# Patient Record
Sex: Female | Born: 1955
Health system: Southern US, Community
[De-identification: ages and names within clinical notes are randomized; demographics above are authoritative.]

## PROBLEM LIST (undated history)

## (undated) DIAGNOSIS — R519 Headache, unspecified: Secondary | ICD-10-CM

## (undated) DIAGNOSIS — E739 Lactose intolerance, unspecified: Secondary | ICD-10-CM

## (undated) DIAGNOSIS — K449 Diaphragmatic hernia without obstruction or gangrene: Secondary | ICD-10-CM

## (undated) DIAGNOSIS — R51 Headache: Secondary | ICD-10-CM

## (undated) DIAGNOSIS — M069 Rheumatoid arthritis, unspecified: Secondary | ICD-10-CM

## (undated) DIAGNOSIS — Z9289 Personal history of other medical treatment: Secondary | ICD-10-CM

## (undated) DIAGNOSIS — M1990 Unspecified osteoarthritis, unspecified site: Secondary | ICD-10-CM

## (undated) DIAGNOSIS — D649 Anemia, unspecified: Secondary | ICD-10-CM

## (undated) DIAGNOSIS — M7989 Other specified soft tissue disorders: Secondary | ICD-10-CM

## (undated) DIAGNOSIS — K5900 Constipation, unspecified: Secondary | ICD-10-CM

## (undated) DIAGNOSIS — J45909 Unspecified asthma, uncomplicated: Secondary | ICD-10-CM

## (undated) DIAGNOSIS — N179 Acute kidney failure, unspecified: Secondary | ICD-10-CM

## (undated) DIAGNOSIS — R0602 Shortness of breath: Secondary | ICD-10-CM

## (undated) DIAGNOSIS — I10 Essential (primary) hypertension: Secondary | ICD-10-CM

## (undated) DIAGNOSIS — I2699 Other pulmonary embolism without acute cor pulmonale: Secondary | ICD-10-CM

## (undated) DIAGNOSIS — R197 Diarrhea, unspecified: Secondary | ICD-10-CM

## (undated) DIAGNOSIS — D860 Sarcoidosis of lung: Secondary | ICD-10-CM

## (undated) HISTORY — DX: Rheumatoid arthritis, unspecified: M06.9

## (undated) HISTORY — DX: Constipation, unspecified: K59.00

## (undated) HISTORY — PX: ANKLE FRACTURE SURGERY: SHX122

## (undated) HISTORY — DX: Other specified soft tissue disorders: M79.89

## (undated) HISTORY — DX: Unspecified osteoarthritis, unspecified site: M19.90

## (undated) HISTORY — DX: Diaphragmatic hernia without obstruction or gangrene: K44.9

## (undated) HISTORY — DX: Lactose intolerance, unspecified: E73.9

## (undated) HISTORY — DX: Shortness of breath: R06.02

## (undated) HISTORY — PX: VAGINAL HYSTERECTOMY: SUR661

## (undated) HISTORY — PX: KNEE ARTHROSCOPY: SHX127

## (undated) HISTORY — PX: TUBAL LIGATION: SHX77

---

## 1977-07-26 DIAGNOSIS — Z9289 Personal history of other medical treatment: Secondary | ICD-10-CM

## 1977-07-26 HISTORY — DX: Personal history of other medical treatment: Z92.89

## 1998-01-29 ENCOUNTER — Emergency Department (HOSPITAL_COMMUNITY): Admission: EM | Admit: 1998-01-29 | Discharge: 1998-01-29 | Payer: Self-pay | Admitting: Emergency Medicine

## 1998-03-01 ENCOUNTER — Emergency Department (HOSPITAL_COMMUNITY): Admission: EM | Admit: 1998-03-01 | Discharge: 1998-03-01 | Payer: Self-pay | Admitting: Emergency Medicine

## 1998-04-04 ENCOUNTER — Ambulatory Visit (HOSPITAL_BASED_OUTPATIENT_CLINIC_OR_DEPARTMENT_OTHER): Admission: RE | Admit: 1998-04-04 | Discharge: 1998-04-04 | Payer: Self-pay | Admitting: Specialist

## 1998-04-14 ENCOUNTER — Encounter: Admission: RE | Admit: 1998-04-14 | Discharge: 1998-07-13 | Payer: Self-pay | Admitting: Specialist

## 1998-06-12 ENCOUNTER — Encounter: Admission: RE | Admit: 1998-06-12 | Discharge: 1998-06-12 | Payer: Self-pay | Admitting: Internal Medicine

## 1998-06-14 ENCOUNTER — Emergency Department (HOSPITAL_COMMUNITY): Admission: EM | Admit: 1998-06-14 | Discharge: 1998-06-14 | Payer: Self-pay | Admitting: Emergency Medicine

## 1998-06-24 ENCOUNTER — Encounter: Payer: Self-pay | Admitting: Obstetrics and Gynecology

## 1998-06-24 ENCOUNTER — Ambulatory Visit (HOSPITAL_COMMUNITY): Admission: RE | Admit: 1998-06-24 | Discharge: 1998-06-24 | Payer: Self-pay | Admitting: Obstetrics and Gynecology

## 1998-07-16 ENCOUNTER — Encounter: Admission: RE | Admit: 1998-07-16 | Discharge: 1998-07-16 | Payer: Self-pay | Admitting: Hematology and Oncology

## 1998-09-07 ENCOUNTER — Inpatient Hospital Stay (HOSPITAL_COMMUNITY): Admission: EM | Admit: 1998-09-07 | Discharge: 1998-09-10 | Payer: Self-pay | Admitting: Emergency Medicine

## 1998-09-07 ENCOUNTER — Encounter: Payer: Self-pay | Admitting: Emergency Medicine

## 1998-09-08 ENCOUNTER — Encounter: Payer: Self-pay | Admitting: Internal Medicine

## 1998-09-12 ENCOUNTER — Encounter: Admission: RE | Admit: 1998-09-12 | Discharge: 1998-09-12 | Payer: Self-pay | Admitting: Internal Medicine

## 1998-09-22 ENCOUNTER — Encounter: Admission: RE | Admit: 1998-09-22 | Discharge: 1998-09-22 | Payer: Self-pay | Admitting: Internal Medicine

## 1998-10-28 ENCOUNTER — Encounter: Admission: RE | Admit: 1998-10-28 | Discharge: 1998-10-28 | Payer: Self-pay | Admitting: Internal Medicine

## 1998-12-01 ENCOUNTER — Encounter: Admission: RE | Admit: 1998-12-01 | Discharge: 1998-12-01 | Payer: Self-pay | Admitting: Internal Medicine

## 1998-12-05 ENCOUNTER — Other Ambulatory Visit: Admission: RE | Admit: 1998-12-05 | Discharge: 1998-12-05 | Payer: Self-pay | Admitting: Obstetrics and Gynecology

## 1998-12-27 ENCOUNTER — Encounter: Payer: Self-pay | Admitting: Emergency Medicine

## 1998-12-27 ENCOUNTER — Emergency Department (HOSPITAL_COMMUNITY): Admission: EM | Admit: 1998-12-27 | Discharge: 1998-12-27 | Payer: Self-pay | Admitting: Emergency Medicine

## 1998-12-30 ENCOUNTER — Encounter: Admission: RE | Admit: 1998-12-30 | Discharge: 1998-12-30 | Payer: Self-pay | Admitting: Internal Medicine

## 1999-01-01 ENCOUNTER — Ambulatory Visit (HOSPITAL_COMMUNITY): Admission: RE | Admit: 1999-01-01 | Discharge: 1999-01-01 | Payer: Self-pay | Admitting: Internal Medicine

## 1999-01-01 ENCOUNTER — Encounter: Admission: RE | Admit: 1999-01-01 | Discharge: 1999-01-01 | Payer: Self-pay | Admitting: Hematology and Oncology

## 1999-01-01 ENCOUNTER — Encounter: Payer: Self-pay | Admitting: Internal Medicine

## 1999-01-28 ENCOUNTER — Encounter: Admission: RE | Admit: 1999-01-28 | Discharge: 1999-01-28 | Payer: Self-pay | Admitting: Internal Medicine

## 1999-02-13 ENCOUNTER — Encounter: Admission: RE | Admit: 1999-02-13 | Discharge: 1999-02-13 | Payer: Self-pay | Admitting: Internal Medicine

## 1999-03-11 ENCOUNTER — Ambulatory Visit (HOSPITAL_COMMUNITY): Admission: RE | Admit: 1999-03-11 | Discharge: 1999-03-11 | Payer: Self-pay | Admitting: Hematology and Oncology

## 1999-03-11 ENCOUNTER — Encounter: Payer: Self-pay | Admitting: Hematology and Oncology

## 1999-03-11 ENCOUNTER — Encounter: Admission: RE | Admit: 1999-03-11 | Discharge: 1999-03-11 | Payer: Self-pay | Admitting: Hematology and Oncology

## 1999-03-24 ENCOUNTER — Encounter: Admission: RE | Admit: 1999-03-24 | Discharge: 1999-03-24 | Payer: Self-pay | Admitting: Internal Medicine

## 1999-04-05 ENCOUNTER — Emergency Department (HOSPITAL_COMMUNITY): Admission: EM | Admit: 1999-04-05 | Discharge: 1999-04-05 | Payer: Self-pay | Admitting: Emergency Medicine

## 1999-06-17 ENCOUNTER — Encounter: Admission: RE | Admit: 1999-06-17 | Discharge: 1999-06-17 | Payer: Self-pay | Admitting: Internal Medicine

## 1999-06-26 ENCOUNTER — Encounter: Payer: Self-pay | Admitting: Obstetrics and Gynecology

## 1999-06-26 ENCOUNTER — Ambulatory Visit (HOSPITAL_COMMUNITY): Admission: RE | Admit: 1999-06-26 | Discharge: 1999-06-26 | Payer: Self-pay | Admitting: Obstetrics and Gynecology

## 1999-07-27 HISTORY — PX: WRIST FRACTURE SURGERY: SHX121

## 1999-08-20 ENCOUNTER — Encounter: Admission: RE | Admit: 1999-08-20 | Discharge: 1999-08-20 | Payer: Self-pay | Admitting: Internal Medicine

## 1999-09-29 ENCOUNTER — Encounter: Admission: RE | Admit: 1999-09-29 | Discharge: 1999-09-29 | Payer: Self-pay | Admitting: Internal Medicine

## 1999-09-30 ENCOUNTER — Ambulatory Visit (HOSPITAL_COMMUNITY): Admission: RE | Admit: 1999-09-30 | Discharge: 1999-09-30 | Payer: Self-pay | Admitting: Internal Medicine

## 1999-09-30 ENCOUNTER — Encounter: Payer: Self-pay | Admitting: Internal Medicine

## 1999-11-23 ENCOUNTER — Encounter: Payer: Self-pay | Admitting: Emergency Medicine

## 1999-11-23 ENCOUNTER — Emergency Department (HOSPITAL_COMMUNITY): Admission: EM | Admit: 1999-11-23 | Discharge: 1999-11-23 | Payer: Self-pay | Admitting: Emergency Medicine

## 1999-11-25 ENCOUNTER — Encounter: Admission: RE | Admit: 1999-11-25 | Discharge: 1999-11-25 | Payer: Self-pay | Admitting: Hematology and Oncology

## 1999-12-02 ENCOUNTER — Ambulatory Visit (HOSPITAL_COMMUNITY): Admission: RE | Admit: 1999-12-02 | Discharge: 1999-12-02 | Payer: Self-pay | Admitting: Hematology and Oncology

## 1999-12-02 ENCOUNTER — Encounter: Payer: Self-pay | Admitting: Internal Medicine

## 1999-12-02 ENCOUNTER — Encounter: Admission: RE | Admit: 1999-12-02 | Discharge: 1999-12-02 | Payer: Self-pay | Admitting: Internal Medicine

## 1999-12-10 ENCOUNTER — Other Ambulatory Visit: Admission: RE | Admit: 1999-12-10 | Discharge: 1999-12-10 | Payer: Self-pay | Admitting: Obstetrics and Gynecology

## 1999-12-16 ENCOUNTER — Encounter: Admission: RE | Admit: 1999-12-16 | Discharge: 1999-12-16 | Payer: Self-pay | Admitting: Internal Medicine

## 2000-01-06 ENCOUNTER — Encounter: Admission: RE | Admit: 2000-01-06 | Discharge: 2000-01-06 | Payer: Self-pay | Admitting: Internal Medicine

## 2000-02-02 ENCOUNTER — Encounter: Payer: Self-pay | Admitting: Emergency Medicine

## 2000-02-02 ENCOUNTER — Emergency Department (HOSPITAL_COMMUNITY): Admission: EM | Admit: 2000-02-02 | Discharge: 2000-02-02 | Payer: Self-pay | Admitting: Emergency Medicine

## 2000-03-28 ENCOUNTER — Emergency Department (HOSPITAL_COMMUNITY): Admission: EM | Admit: 2000-03-28 | Discharge: 2000-03-28 | Payer: Self-pay | Admitting: Emergency Medicine

## 2000-03-28 ENCOUNTER — Encounter: Payer: Self-pay | Admitting: Emergency Medicine

## 2000-04-01 ENCOUNTER — Ambulatory Visit (HOSPITAL_COMMUNITY): Admission: RE | Admit: 2000-04-01 | Discharge: 2000-04-01 | Payer: Self-pay | Admitting: Emergency Medicine

## 2000-04-01 ENCOUNTER — Encounter: Payer: Self-pay | Admitting: Emergency Medicine

## 2000-04-13 ENCOUNTER — Encounter: Payer: Self-pay | Admitting: Gastroenterology

## 2000-04-13 ENCOUNTER — Encounter: Admission: RE | Admit: 2000-04-13 | Discharge: 2000-04-13 | Payer: Self-pay | Admitting: Gastroenterology

## 2000-05-02 ENCOUNTER — Ambulatory Visit (HOSPITAL_COMMUNITY): Admission: RE | Admit: 2000-05-02 | Discharge: 2000-05-02 | Payer: Self-pay | Admitting: Gastroenterology

## 2000-05-04 ENCOUNTER — Encounter: Admission: RE | Admit: 2000-05-04 | Discharge: 2000-05-04 | Payer: Self-pay | Admitting: Internal Medicine

## 2000-05-19 ENCOUNTER — Inpatient Hospital Stay (HOSPITAL_COMMUNITY): Admission: EM | Admit: 2000-05-19 | Discharge: 2000-05-21 | Payer: Self-pay | Admitting: Emergency Medicine

## 2000-05-20 ENCOUNTER — Encounter: Payer: Self-pay | Admitting: Internal Medicine

## 2000-05-20 ENCOUNTER — Encounter: Payer: Self-pay | Admitting: Emergency Medicine

## 2000-06-22 ENCOUNTER — Encounter: Admission: RE | Admit: 2000-06-22 | Discharge: 2000-06-22 | Payer: Self-pay | Admitting: Internal Medicine

## 2000-07-22 ENCOUNTER — Inpatient Hospital Stay (HOSPITAL_COMMUNITY): Admission: EM | Admit: 2000-07-22 | Discharge: 2000-07-24 | Payer: Self-pay | Admitting: Emergency Medicine

## 2000-07-22 ENCOUNTER — Encounter: Payer: Self-pay | Admitting: Emergency Medicine

## 2000-07-23 ENCOUNTER — Encounter: Payer: Self-pay | Admitting: Internal Medicine

## 2000-07-24 ENCOUNTER — Encounter: Payer: Self-pay | Admitting: Internal Medicine

## 2000-07-29 ENCOUNTER — Encounter: Admission: RE | Admit: 2000-07-29 | Discharge: 2000-07-29 | Payer: Self-pay

## 2000-08-11 ENCOUNTER — Encounter: Admission: RE | Admit: 2000-08-11 | Discharge: 2000-08-11 | Payer: Self-pay | Admitting: Internal Medicine

## 2000-08-16 ENCOUNTER — Encounter: Admission: RE | Admit: 2000-08-16 | Discharge: 2000-08-16 | Payer: Self-pay | Admitting: Internal Medicine

## 2000-08-16 ENCOUNTER — Encounter: Payer: Self-pay | Admitting: Internal Medicine

## 2000-08-16 ENCOUNTER — Encounter: Admission: RE | Admit: 2000-08-16 | Discharge: 2000-11-14 | Payer: Self-pay | Admitting: Internal Medicine

## 2000-09-20 ENCOUNTER — Emergency Department (HOSPITAL_COMMUNITY): Admission: EM | Admit: 2000-09-20 | Discharge: 2000-09-20 | Payer: Self-pay

## 2000-11-02 ENCOUNTER — Emergency Department (HOSPITAL_COMMUNITY): Admission: EM | Admit: 2000-11-02 | Discharge: 2000-11-02 | Payer: Self-pay | Admitting: Emergency Medicine

## 2000-11-02 ENCOUNTER — Encounter: Payer: Self-pay | Admitting: Emergency Medicine

## 2001-01-06 ENCOUNTER — Encounter: Payer: Self-pay | Admitting: Emergency Medicine

## 2001-01-07 ENCOUNTER — Observation Stay (HOSPITAL_COMMUNITY): Admission: EM | Admit: 2001-01-07 | Discharge: 2001-01-07 | Payer: Self-pay | Admitting: Emergency Medicine

## 2001-01-25 ENCOUNTER — Encounter: Admission: RE | Admit: 2001-01-25 | Discharge: 2001-01-25 | Payer: Self-pay | Admitting: Internal Medicine

## 2001-03-20 ENCOUNTER — Other Ambulatory Visit: Admission: RE | Admit: 2001-03-20 | Discharge: 2001-03-20 | Payer: Self-pay | Admitting: Obstetrics and Gynecology

## 2001-03-20 ENCOUNTER — Emergency Department (HOSPITAL_COMMUNITY): Admission: EM | Admit: 2001-03-20 | Discharge: 2001-03-20 | Payer: Self-pay | Admitting: Emergency Medicine

## 2001-09-20 ENCOUNTER — Encounter: Admission: RE | Admit: 2001-09-20 | Discharge: 2001-09-20 | Payer: Self-pay | Admitting: Internal Medicine

## 2001-10-02 ENCOUNTER — Emergency Department (HOSPITAL_COMMUNITY): Admission: EM | Admit: 2001-10-02 | Discharge: 2001-10-02 | Payer: Self-pay | Admitting: Emergency Medicine

## 2001-10-18 ENCOUNTER — Encounter: Admission: RE | Admit: 2001-10-18 | Discharge: 2001-10-18 | Payer: Self-pay | Admitting: Internal Medicine

## 2001-12-08 ENCOUNTER — Encounter: Admission: RE | Admit: 2001-12-08 | Discharge: 2001-12-08 | Payer: Self-pay | Admitting: Internal Medicine

## 2002-01-02 ENCOUNTER — Encounter: Payer: Self-pay | Admitting: Obstetrics and Gynecology

## 2002-01-02 ENCOUNTER — Ambulatory Visit (HOSPITAL_COMMUNITY): Admission: RE | Admit: 2002-01-02 | Discharge: 2002-01-02 | Payer: Self-pay | Admitting: Obstetrics and Gynecology

## 2002-03-28 ENCOUNTER — Inpatient Hospital Stay (HOSPITAL_COMMUNITY): Admission: AD | Admit: 2002-03-28 | Discharge: 2002-03-28 | Payer: Self-pay | Admitting: Obstetrics and Gynecology

## 2002-04-04 ENCOUNTER — Encounter: Admission: RE | Admit: 2002-04-04 | Discharge: 2002-04-04 | Payer: Self-pay | Admitting: Internal Medicine

## 2002-05-09 ENCOUNTER — Other Ambulatory Visit: Admission: RE | Admit: 2002-05-09 | Discharge: 2002-05-09 | Payer: Self-pay | Admitting: Obstetrics and Gynecology

## 2002-05-10 ENCOUNTER — Encounter: Admission: RE | Admit: 2002-05-10 | Discharge: 2002-05-10 | Payer: Self-pay | Admitting: Internal Medicine

## 2002-05-14 ENCOUNTER — Encounter: Payer: Self-pay | Admitting: Cardiology

## 2002-05-14 ENCOUNTER — Ambulatory Visit (HOSPITAL_COMMUNITY): Admission: RE | Admit: 2002-05-14 | Discharge: 2002-05-14 | Payer: Self-pay | Admitting: Internal Medicine

## 2002-06-01 ENCOUNTER — Encounter: Admission: RE | Admit: 2002-06-01 | Discharge: 2002-06-01 | Payer: Self-pay | Admitting: Internal Medicine

## 2002-08-09 ENCOUNTER — Emergency Department (HOSPITAL_COMMUNITY): Admission: EM | Admit: 2002-08-09 | Discharge: 2002-08-10 | Payer: Self-pay | Admitting: Emergency Medicine

## 2002-08-10 ENCOUNTER — Encounter: Payer: Self-pay | Admitting: Emergency Medicine

## 2002-08-15 ENCOUNTER — Encounter: Admission: RE | Admit: 2002-08-15 | Discharge: 2002-08-15 | Payer: Self-pay | Admitting: Internal Medicine

## 2002-08-15 ENCOUNTER — Ambulatory Visit (HOSPITAL_COMMUNITY): Admission: RE | Admit: 2002-08-15 | Discharge: 2002-08-15 | Payer: Self-pay | Admitting: Internal Medicine

## 2002-08-15 ENCOUNTER — Encounter: Payer: Self-pay | Admitting: Internal Medicine

## 2002-08-31 ENCOUNTER — Encounter: Admission: RE | Admit: 2002-08-31 | Discharge: 2002-08-31 | Payer: Self-pay | Admitting: Internal Medicine

## 2002-09-19 ENCOUNTER — Encounter: Admission: RE | Admit: 2002-09-19 | Discharge: 2002-09-19 | Payer: Self-pay | Admitting: Internal Medicine

## 2002-10-03 ENCOUNTER — Encounter: Payer: Self-pay | Admitting: *Deleted

## 2002-10-03 ENCOUNTER — Emergency Department (HOSPITAL_COMMUNITY): Admission: EM | Admit: 2002-10-03 | Discharge: 2002-10-04 | Payer: Self-pay | Admitting: Emergency Medicine

## 2002-11-21 ENCOUNTER — Encounter: Admission: RE | Admit: 2002-11-21 | Discharge: 2002-11-21 | Payer: Self-pay | Admitting: Internal Medicine

## 2003-01-23 ENCOUNTER — Encounter: Admission: RE | Admit: 2003-01-23 | Discharge: 2003-01-23 | Payer: Self-pay | Admitting: Internal Medicine

## 2003-04-19 ENCOUNTER — Ambulatory Visit (HOSPITAL_COMMUNITY): Admission: RE | Admit: 2003-04-19 | Discharge: 2003-04-19 | Payer: Self-pay | Admitting: Obstetrics and Gynecology

## 2003-04-19 ENCOUNTER — Encounter: Payer: Self-pay | Admitting: Obstetrics and Gynecology

## 2004-01-18 ENCOUNTER — Emergency Department (HOSPITAL_COMMUNITY): Admission: EM | Admit: 2004-01-18 | Discharge: 2004-01-19 | Payer: Self-pay | Admitting: Emergency Medicine

## 2004-09-30 ENCOUNTER — Emergency Department (HOSPITAL_COMMUNITY): Admission: EM | Admit: 2004-09-30 | Discharge: 2004-09-30 | Payer: Self-pay | Admitting: *Deleted

## 2008-08-16 ENCOUNTER — Emergency Department (HOSPITAL_COMMUNITY): Admission: EM | Admit: 2008-08-16 | Discharge: 2008-08-16 | Payer: Self-pay | Admitting: Emergency Medicine

## 2009-08-30 ENCOUNTER — Emergency Department (HOSPITAL_COMMUNITY): Admission: EM | Admit: 2009-08-30 | Discharge: 2009-08-30 | Payer: Self-pay | Admitting: Emergency Medicine

## 2009-09-03 ENCOUNTER — Emergency Department (HOSPITAL_COMMUNITY): Admission: EM | Admit: 2009-09-03 | Discharge: 2009-09-03 | Payer: Self-pay | Admitting: Emergency Medicine

## 2010-07-22 ENCOUNTER — Ambulatory Visit (HOSPITAL_COMMUNITY)
Admission: RE | Admit: 2010-07-22 | Discharge: 2010-07-22 | Payer: Self-pay | Source: Home / Self Care | Attending: Internal Medicine | Admitting: Internal Medicine

## 2010-09-21 ENCOUNTER — Emergency Department (HOSPITAL_COMMUNITY)
Admission: EM | Admit: 2010-09-21 | Discharge: 2010-09-22 | Disposition: A | Discharge status: Home / Self Care | Payer: Self-pay | Attending: Emergency Medicine | Admitting: Emergency Medicine

## 2010-09-21 DIAGNOSIS — E669 Obesity, unspecified: Secondary | ICD-10-CM | POA: Insufficient documentation

## 2010-09-21 DIAGNOSIS — D869 Sarcoidosis, unspecified: Secondary | ICD-10-CM | POA: Insufficient documentation

## 2010-09-21 DIAGNOSIS — R110 Nausea: Secondary | ICD-10-CM | POA: Insufficient documentation

## 2010-09-21 DIAGNOSIS — M25519 Pain in unspecified shoulder: Secondary | ICD-10-CM | POA: Insufficient documentation

## 2010-09-22 ENCOUNTER — Emergency Department (HOSPITAL_COMMUNITY): Payer: Self-pay

## 2010-10-16 LAB — POCT I-STAT, CHEM 8
BUN: 8 mg/dL (ref 6–23)
Chloride: 102 mEq/L (ref 96–112)
Sodium: 138 mEq/L (ref 135–145)

## 2010-10-16 LAB — URINALYSIS, ROUTINE W REFLEX MICROSCOPIC
Glucose, UA: NEGATIVE mg/dL
Leukocytes, UA: NEGATIVE
Nitrite: NEGATIVE
Protein, ur: 30 mg/dL — AB
pH: 7 (ref 5.0–8.0)

## 2010-10-16 LAB — DIFFERENTIAL
Basophils Absolute: 0 10*3/uL (ref 0.0–0.1)
Lymphocytes Relative: 11 % — ABNORMAL LOW (ref 12–46)
Neutro Abs: 6.3 10*3/uL (ref 1.7–7.7)
Neutrophils Relative %: 86 % — ABNORMAL HIGH (ref 43–77)

## 2010-10-16 LAB — CBC
Platelets: 306 10*3/uL (ref 150–400)
RDW: 12 % (ref 11.5–15.5)

## 2010-10-16 LAB — URINE MICROSCOPIC-ADD ON

## 2010-11-09 LAB — DIFFERENTIAL
Eosinophils Absolute: 0 10*3/uL (ref 0.0–0.7)
Lymphocytes Relative: 16 % (ref 12–46)
Lymphs Abs: 1.3 10*3/uL (ref 0.7–4.0)
Monocytes Relative: 5 % (ref 3–12)
Neutro Abs: 6.5 10*3/uL (ref 1.7–7.7)
Neutrophils Relative %: 78 % — ABNORMAL HIGH (ref 43–77)

## 2010-11-09 LAB — CBC
Hemoglobin: 11.8 g/dL — ABNORMAL LOW (ref 12.0–15.0)
MCHC: 33.8 g/dL (ref 30.0–36.0)
MCV: 98.6 fL (ref 78.0–100.0)
RDW: 12.1 % (ref 11.5–15.5)

## 2010-11-09 LAB — LIPASE, BLOOD: Lipase: 19 U/L (ref 11–59)

## 2010-11-09 LAB — COMPREHENSIVE METABOLIC PANEL
ALT: 27 U/L (ref 0–35)
Calcium: 9.3 mg/dL (ref 8.4–10.5)
Creatinine, Ser: 0.75 mg/dL (ref 0.4–1.2)
GFR calc non Af Amer: >60 mL/min (ref >60)
Glucose, Bld: 124 mg/dL — ABNORMAL HIGH (ref 70–99)
Sodium: 140 mEq/L (ref 135–145)
Total Protein: 7.4 g/dL (ref 6.0–8.3)

## 2010-12-11 NOTE — Consult Note (Signed)
[place]. [facility]  Patient:    Michelle Rios, Michelle Rios                       MRN: [id number] Proc. Date: 07/22/00 Adm. Date:  [id number] Attending:  Farley Ly CC:         Farley Ly, M.D.   Consultation Report  REQUESTING PHYSICIAN:  Farley Ly, M.D.  CHIEF COMPLAINT:  Cough and chest pain.  HISTORY:  This is a very complicated 55 year old black female with a history of sarcoid documented by transbronchial biopsy in May 1990 with interstitial lung disease then.  I have seen her in the past for episodes of dyspnea and cough, but she is admitted at this point because of chest pain.  It turns out for the last 12 years or so, she has been having intermittent right flank and anterior chest discomfort that comes and goes with no pattern whatsoever.  It can last from several minutes to several hours to several weeks at a time.  It does appear to be made slightly worse by taking a deep breath, but is not intensely pleuritic and does not cause any dyspnea.  I had seen her for a cough in my office and it can vary between being just vaguely painful to being intensely painful.  I saw her with mild pain in my office on November 13 and did not even note that she was complaining of any chest pain.  However, I gave her Depo Medrol injection for a cough that was present and wanted to see if the cough was responsive to steroids, at which time, I was going to ask her to start a course of Pulmicort empirically (reasoning that she either cough bearing asthma or sarcoid involvement of the airways and we might approach this without the use of systemic steroids by using Pulmicort.  However, she was not enthusiastic about inhaled steroids and never called me back to report whether the Depo Medrol helped the cough.  She states, it did help the chest pain.  On the hand, the chest pain has come and gone without any intervention in the past so it is very difficult to  know whether the Depo Medrol in fact helped the pain at all.  She is now readmitted with intense pain with lasting for several hours until she received morphine in the emergency room.  She was also given nitroglycerin with questionable improvement.  She had borderline positive CPK-MBs and was admitted for further evaluation.  Presently, she denies any significant cough.  She states, the cough also comes and goes without any pattern and does not remember whether or not the Depo Medrol injection eliminated the cough.  She states, the cough has been more intense over the last week, but denies the cough brought on the pain or if there has ever been an association between the cough and the chest pain.  She denies any fevers, chills, sweats, orthopnea, PND or leg swelling.  PAST MEDICAL HISTORY: 1. Morbid obesity. 2. Sarcoid diagnosed May 1990 by transbronchial biopsy. 3. Status post hysterectomy 1996.  ALLERGIES:  IVP DYE causes her throat to "close up".  MEDICATIONS:  She is not consistent about taking any medication on a regular basis.  SOCIAL HISTORY:  She has never been a smoker.  She is minimally exposed to passive smoke.  She works as a Water engineer at [employer].  She has no unusual travel, pet or hobby exposure.  FAMILY  HISTORY:  Positive for emphysema in a father, a smoker, who also had asthma and heart disease.  REVIEW OF SYSTEMS:  Taken in detail and essentially negative, except for difficulty with weight control with no overt reflux symptoms.  PHYSICAL EXAMINATION:  GENERAL:  This is a morbidly obese black female lying back in bed with head of bed elevated less than 30 degrees with no increase work of breathing.  She appears to have a very abnormal affect with somewhat of a "belle indifference" affect.  She is afebrile.  VITAL SIGNS:  Normal.  HEENT:  Oropharynx is clear with no evidence of postnasal drainage or cobblestoning.  No evidence of  thrush.  NECK:  Supple without cervical adenopathy, ______ thyromegaly.  LUNGS:  Lung fields perfectly clear bilaterally to auscultation and percussion with no rub appreciated.  CARDIAC:  Regular rate and rhythm without murmur, gallop or rub present. There was no displacement of PMI.  Although S1 and S2 were diminished.  ABDOMEN:  Obese, but otherwise benign.  There is no palpable organomegaly or masses or tenderness.  EXTREMITIES:  Warm without calf tenderness, cyanosis, clubbing or edema.  NEUROLOGIC:  No focal deficits ______ , but mood and affect were a bit unusual as noted.  SKIN:  Warm and dry with no rash or lesions.  Chest x-ray revealed scarring in the right upper lobe and slight fullness to the right hilum.  I reviewed her jacket and found no acute changes on x-ray and I also noted that she had had a previous abdominal ultrasound which was normal (presumably for the same pain) and a VQ scan (presumably for the same pain), which revealed a 60 second defect in the right upper lobe.  Hemoglobin saturation was 94 to 98% on room air.  IMPRESSION:  Recurrent chest pain causing admission.  The pain has had the exact same distribution and only varies in terms of intensity over the last 12 years, never in terms of location.  This makes it extremely unlikely that pulmonary embolism is the explanation of pain because course of pulmonary embolism would not tend to go to the same place over and over again. Nevertheless, I agree with obtaining of spiral CT scan to be complete and also to shed further light on any interstitial or pleural changes that may indicate active sarcoid.  (Sarcoid does not usually cause pleuritic pain, but it can be associated with pleural effusion in less than 5% of cases.) If there is no pleural effusion present, I think that the pleuritic pain is not likely related at all to sarcoid.  I think the chest pain is most likely irritable bowel syndrome or  reflux related.  I am more likely to believe it is irritable bowel related than  reflux just because it is lateralizing and not central.  I recommend that she be started on Citrucel one teaspoon daily and be maintained on PPI (Although she states, she takes PPI therapy, she is not consistent at all about taking it.).  Finally, I note that this patients cough has been problematic.  The problem with the chest pain and the cough is that there are not consistent enough problems to lend themselves to therapeutic trials of any medications. That is, they come and go spontaneously and trying to eliminate them would be like trying to hit a shifting goal post in my opinion.  This is especially true given the fact that her affect is a bit bizarre and that she has been difficult at best to  work with as an outpatient.  I would certainly be happy to participate in her care as an outpatient however, if she feels I can be of any help.  In conclusion, I do not believe that the chest pain is related to sarcoid.  I do not believe the chest pain is of pulmonary etiology unless she has some form musculoskeletal discomfort related to coughing, although, she denied any association between the intensity of the cough that comes and goes and the onset of the chest pain, which also comes and goes over the last 12 years. DD:  07/23/00 TD:  07/23/00 Job: [id number]

## 2010-12-11 NOTE — Procedures (Signed)
[facility]  Patient:    Michelle Rios, Michelle Rios                       MRN: [id number] Proc. Date: 05/02/00 Adm. Date:  [id number] Attending:  Rich Brave CC:         Farley Ly, M.D.   Procedure Report  PROCEDURE:  Upper endoscopy.  INDICATIONS FOR PROCEDURE:  This is a 55 year old female with nonspecific upper abdominal symptoms which have been somewhat refractory, although gradually improving, on antipeptic therapy. Endoscopy about 4 years ago was unrevealing.  FINDINGS:  Normal exam other than small hiatal hernia and esophageal ring.  DESCRIPTION OF PROCEDURE:  The nature, purpose and risk of the procedure were familiar to the patient from prior examination and she provided written consent. Sedation was fentanyl 50 mcg and Versed 4 mg IV without arrhythmias or desaturations. The Olympus adult video endoscope was passed under direct vision. The vocal cords were only very briefly seen; it appeared that the larynx was somewhat boggy although not overtly inflamed. The esophagus had normal mucosa throughout its entire length, without evidence of reflux esophagitis, Barretts esophagus, varices, infection or neoplasia and no active reflux was noted during this exam. The stomach was entered, traversing widely patent esophageal ring at the squamocolumnar junction, below which was a 2 cm hiatal hernia.  The stomach contained no significant residual and had normal mucosa without evidence of gastritis, erosions, ulcers, polyps or masses and a retroflexed view of the proximal stomach was unremarkable. The pylorus, duodenal bulb and second duodenum looked normal.  The previous gastric and duodenal biopsies obtained 4 years were negative so I did not obtain biopsies today. The scope was removed from the patient who tolerated the procedure well and without apparent complication.  IMPRESSION:  Small hiatal hernia with esophageal ring, otherwise  normal exam.  PLAN:  Clinical follow-up. Continue current therapy (includes Nexium). DD:  05/02/00 TD:  05/02/00 Job: [id number]

## 2010-12-11 NOTE — Discharge Summary (Signed)
[place]. [facility]  Patient:    Michelle Rios, Michelle Rios                       MRN: [id number] Adm. Date:  [id number] Disc. Date: [id number] Attending:  Madaline Guthrie Dictator:   Rennis Petty, M.D. CC:         Farley Ly, M.D.  Charlaine Dalton. Sherene Sires, M.D. [facility]   Discharge Summary  DISCHARGE DIAGNOSES: 1. Lower extremity swelling secondary to Norvasc. 2. Pulmonary sarcoidosis diagnosed by bronchial biopsy in 1989. Recent    CT scan revealed persistent right lower lobe infiltrate with perihilar    adenopathy, no recent PFTs done, 96% on room air oxygenation. 3. Obesity. 4. Hypertension. 5. Anemia. 6. Asthma.  DISCHARGE MEDICATIONS: 1. Do not take Norvasc. 2. Lasix 20 mg 1 tab p.o. q.d.  PROCEDURES:  On May 19, 2000, a 2-D echo which was technically limited but showed normal right atrium as well as normal right ventricular sides, PA where not measurable, normal ejection fraction of 60% and trace AR but otherwise technically limited study. Venous Dopplers on May 20, 2000 showed no evidence of lower extremity DVT. CT scan of chest on May 20, 2000 which revealed right lower lobe infiltrate, needs to be compared to previous CT but no acute PE currently.  BRIEF ADMITTING HPI:  A 55 year old female with history of lower extremity pain and swelling that has been progressive over the last few weeks. She denies cough, chest pain or shortness of breath. Admission vitals were stable with O2 sat of 96% on room air, stable blood pressure 124/74. Examination revealed tender bilateral calves with bilateral symmetrical pitting edema up to the calves area.  ADMISSION LABS:  Sodium 137, potassium 3.3, chloride 104, bicarb 40, BUN 12, creatinine 0.9. BMET repeated revealed actual bicarb of 26, ______ 40 and a normal potassium. Liver function tests were within normal limits. D-dimer was negative at 0.15.  EKG was within normal limits. Chest x-ray revealed right  lower lobe infiltrate. No other acute changes.  HOSPITAL COURSE BY PROBLEM: #1 - LOWER EXTREMITY SWELLING.  Michelle Rios actually came late in the night and was unable to have Dopplers and therefore she was kept in the hospital for evaluation. Doppler tests bilaterally revealed no events of DVT. CT scan of the chest also revealed no evidence of acute PE. We also did a 2-D echo to rule out right heart failure, cor pulmonale from her sarcoidosis or obstructive sleep apnea causing pulmonary hypertension. She did have a technically difficult echo as mentioned above but overall normal RV size and RA size which pretty much ruled out severe pulmonary hypertension causing her pedal edema. Michelle Rios says that she used to take inhalers but currently does not take them. An arterial blood gas done this admission revealed a pCO2 of 53 with a pH of 7.31. She does have a history of CO2 retention as well. She has not had PFTs in a while therefore the plan was for her to have outpatient pulmonary function test to evaluate this further. #2 - SHORTNESS OF BREATH. I feel her shortness of breath is not due to a pulmonary ______ as demonstrated by CT of her chest. We felt this was progression of her disease and she will require outpatient PFTs and eventually may be in a sleep study. She refuses inhalers though we had recommended them vehemently for her ? obstructive lung disease. As demonstrated by her FEV1.  FOLLOW-UP:  The following things  need to be followed up as an outpatient: 1. For her pulmonary status, the patient will require outpatient pulmonary    function test. 2. Persistent right lower lobe infiltrate. She will need that worked up as    well though somehow it seems that diagnosis is made as sarcoid by bronchial    biopsy in 1989. 3. She will also require inhaler therapy at least ______ a little bit more on    that point. 4. Free bilateral filling, I will give her ______ currently. 5. Anemia  studies are still pending. This will need to be followed up with    a repeat CBC as an outpatient. I will alert Dr. Meredith Pel on this patients    admission and our plan with her and eventual follow-up with Dr. Meredith Pel    as well as Dr. Sherene Sires. DD:  05/21/00 TD:  05/22/00 Job: [id number]

## 2010-12-11 NOTE — Discharge Summary (Signed)
[place]. [facility]  Patient:    Michelle Rios, Michelle Rios                       MRN: [id number] Adm. Date:  [id number] Disc. Date: [id number] Attending:  Farley Ly Dictator:   Duncan Dull, M.D.                           Discharge Summary  DATE OF BIRTH:  03-12-1956  DISCHARGE DIAGNOSES: 1. Atypical chest pain secondary to sarcoid. 2. Hypertension. 3. History of sarcoid. 4. Gastroesophageal reflux disease.  DISCHARGE MEDICATIONS: 1. Tessalon Perles 100 mg 1 p.o. t.i.d. for cough. 2. Amitriptyline 25 mg 1 p.o. q.h.s. p.r.n. pain. 3. Lasix 20 mg 1 p.o. q.a.m. 4. Flovent 44 mcg metered dose inhaler 2 puffs inhaled twice daily for    wheezing. 5. Prevacid 15 mg 1 p.o. q.d. p.r.n. reflux and cough. 6. Prednisone 5 mg 4 p.o. q.d. x 1 week, then 3 p.o. q.d. x 1 week, then    2 p.o. q.d. x 1 week, then 1 p.o. q.d. x 1 week, then stop.  CONSULTATIONS: 1. Dr. Sandrea Hughs, pulmonology. 2. Dr. Andee Lineman, cardiology.  PROCEDURES: 1. Spiral CT July 23, 2000.  Impression:    a. No evidence of acute pulmonary embolism by CT.    b. Chronic changes involving the upper right lobe including oligemia and       underlying pulmonary air space disease. 2. Adenosine Cardiolite obtained on July 24, 2000, with the following    impression:  Mild hypokinesis involving the distal septum and extending    into the left ventricular apex.  Part of this appearance may be on the    basis of breast attenuation, although ischemia cannot be excluded without a    rest study.  FOLLOW-UP: 1. The patient is to call Dr. Andee Lineman, her cardiologist, for a follow-up    appointment.  She was given the telephone number of [phone]. 2. The patient will follow up with Dr. Meredith Pel on August 05, 2000, for a    regularly scheduled clinic visit.  HISTORY OF PRESENT ILLNESS:  The patient is a 55 year old black female with a history of sarcoid and reflux who presents with sharp pleuritic  right-sided chest pain which began on the evening prior to admission while at rest.  The patient denied shortness of breath, had mild nausea but no emesis, and no diaphoresis.  The pain persisted throughout the night and was 10/10 in severity and not alleviated with position.  She presented to the ED in the morning and had a significant improvement in pain after receiving morphine in the ED.  She reports recent nonproductive cough without fever.  She had a similar pain back in November when she went to see Dr. Sherene Sires, and the pain disappeared at that time after receiving IV Medrol.  PHYSICAL EXAMINATION:  VITAL SIGNS:  On admission temperature 97.4, pulse 64, blood pressure 135/87, respiratory rate 18, saturation 95% on room air.  CHEST:  Notable for soft crackles bilaterally with no wheezes.  CARDIOVASCULAR:  Regular rate and rhythm without murmurs, rubs, or gallops. There was no pedal edema.  RECTAL:  Guaiac-negative.  LABORATORY DATA:  Admission chest x-ray showed no acute disease and no significant change from previous chest x-rays.  EKG was normal sinus rhythm.  First set of CK enzymes showed a CK of 531, CK-MB of 9.7, troponin I less than  0.01.  ABG on room air showed a pH of 7.39, pCO2 of 50, pO2 of 70, and a bicarbonate of 30.  HOSPITAL COURSE: #1 - ATYPICAL CHEST PAIN:  The patient was admitted to a telemetry bed to rule out acute myocardial infarction with serial cardiac enzymes and EKGs. Cardiology consult was obtained, and an adenosine stress Cardiolite was scheduled since the patient had dyspnea with exertion secondary to her pulmonary disease.  Cardiac isoenzymes were normal, EKG was normal, and adenosine Cardiolite had the above-referenced results.  The patient was discharged to home with instructions to follow up with her cardiologist as an outpatient.  Additionally, pulmonary embolus was ruled out with a spiral CT. The patient was noted to have significant  improvement with pain after starting her on prednisone.  She was given a prednisone taper and also started on amitriptyline on the presumption that her pain was neuropathic/inflammatory in etiology.  #2 - DYSPNEA:  The patient was noted to be complaining of dyspnea, although she had saturations of 94% on room air.  She was not wheezing on exam.  She was evaluated by Dr. Sherene Sires.  Her dyspnea was considered to be secondary to her body habitus, as the patient is morbidly obese.  She was encouraged to begin walking 15 minutes a day three times a week and increase as tolerated.  She was also instructed to continue using her Flovent metered dose inhaler twice daily for wheezing.  #3 - COUGH:  The patient had a nonproductive cough during her hospitalization. She remained afebrile, with a normal white count.  She was given a prescription for Tessalon Perles at the time of discharge.  #4 - REFLUX:  The patients symptoms were well controlled on Prevacid 1 p.o. q.d. for cough and reflux.  In summary, the patients atypical chest pain was considered to be pleuritic in nature and secondary to her sarcoid.  She was ruled out for cardiac ischemia and pulmonary embolus with the above-mentioned tests.  She was started on amitriptyline and a prednisone taper while in-house and had significant resolution of her symptoms.  She will follow up with Dr. Meredith Pel in the internal medicine clinic on August 05, 2000.  PERTINENT DISCHARGE LABORATORY DATA:  White blood count of 10.3, hemoglobin of 10.3, hematocrit 29.9, MCV of 95.6, platelets of 348.  Sodium 139, potassium 3.9, chloride 103, bicarbonate 29, glucose 111, BUN 12, creatinine 0.8, calcium 8.9.  CKs were 531, 436, and 339.  CK-MBs were 9.7, 6.3, 4.5. Relative indices were 1.8, 1.4, and 1.3.  Troponin Is were less than 0.01 x 3. DD:  08/03/00 TD:  08/03/00 Job: [id number]

## 2010-12-17 ENCOUNTER — Inpatient Hospital Stay (INDEPENDENT_AMBULATORY_CARE_PROVIDER_SITE_OTHER)
Admission: RE | Admit: 2010-12-17 | Discharge: 2010-12-17 | Disposition: A | Discharge status: Home / Self Care | Payer: Self-pay | Source: Ambulatory Visit | Attending: Emergency Medicine | Admitting: Emergency Medicine

## 2010-12-17 DIAGNOSIS — M25519 Pain in unspecified shoulder: Secondary | ICD-10-CM

## 2010-12-17 DIAGNOSIS — I10 Essential (primary) hypertension: Secondary | ICD-10-CM

## 2011-08-01 ENCOUNTER — Emergency Department (HOSPITAL_COMMUNITY)
Admission: EM | Admit: 2011-08-01 | Discharge: 2011-08-01 | Disposition: A | Discharge status: Home / Self Care | Payer: PRIVATE HEALTH INSURANCE | Attending: Emergency Medicine | Admitting: Emergency Medicine

## 2011-08-01 ENCOUNTER — Other Ambulatory Visit: Payer: Self-pay

## 2011-08-01 ENCOUNTER — Encounter: Payer: Self-pay | Admitting: *Deleted

## 2011-08-01 DIAGNOSIS — I10 Essential (primary) hypertension: Secondary | ICD-10-CM | POA: Insufficient documentation

## 2011-08-01 DIAGNOSIS — H538 Other visual disturbances: Secondary | ICD-10-CM | POA: Insufficient documentation

## 2011-08-01 DIAGNOSIS — G43909 Migraine, unspecified, not intractable, without status migrainosus: Secondary | ICD-10-CM

## 2011-08-01 HISTORY — DX: Essential (primary) hypertension: I10

## 2011-08-01 LAB — POCT I-STAT, CHEM 8
BUN: 9 mg/dL (ref 6–23)
Chloride: 102 mEq/L (ref 96–112)
Creatinine, Ser: 0.7 mg/dL (ref 0.50–1.10)
Potassium: 3.7 mEq/L (ref 3.5–5.1)
Sodium: 142 mEq/L (ref 135–145)

## 2011-08-01 MED ORDER — SODIUM CHLORIDE 0.9 % IV BOLUS (SEPSIS)
500.0000 mL | Freq: Once | INTRAVENOUS | Status: AC
  Administered 2011-08-01: 500 mL via INTRAVENOUS

## 2011-08-01 MED ORDER — DROPERIDOL 2.5 MG/ML IJ SOLN
2.5000 mg | Freq: Once | INTRAMUSCULAR | Status: AC
  Administered 2011-08-01: 2.5 mg via INTRAVENOUS
  Filled 2011-08-01: qty 1

## 2011-08-01 NOTE — ED Notes (Signed)
No new rx given, pt voiced understanding to f/u with PCP and return for worsening of condition

## 2011-08-01 NOTE — ED Notes (Signed)
Pt reports improvement of blurred vision, pt states, "My blurred vision is much better."

## 2011-08-01 NOTE — ED Provider Notes (Signed)
History     CSN: [id number]  Arrival date & time 08/01/11  1557   First MD Initiated Contact with Patient 08/01/11 1709      Chief Complaint  Patient presents with  . Blurred Vision    (Consider location/radiation/quality/duration/timing/severity/associated sxs/prior treatment) HPI The pt has had blurred vision Since 1500. She now has a headache. Her bp has been high. She has not been on bp meds for 2 years. History of migraine headaches.  Past Medical History  Diagnosis Date  . Hypertension     Past Surgical History  Procedure Date  . Wrist surgery   . Tubal ligation   . Vaginal hysterectomy   . Knee cartilage surgery     History reviewed. No pertinent family history.  History  Substance Use Topics  . Smoking status: Never Smoker   . Smokeless tobacco: Not on file  . Alcohol Use: No    OB History    Grav Para Term Preterm Abortions TAB SAB Ect Mult Living                  Review of Systems  All other systems reviewed and are negative.    Allergies  Ivp dye  Home Medications  No current outpatient prescriptions on file.  BP 172/104  Pulse 70  Temp(Src) 98.3 F (36.8 C) (Oral)  Resp 20  SpO2 98%  Physical Exam  Nursing note and vitals reviewed. Constitutional: She is oriented to person, place, and time. She appears well-developed and well-nourished. No distress.  HENT:  Head: Normocephalic and atraumatic.  Eyes: Pupils are equal, round, and reactive to light.  Neck: Normal range of motion.  Cardiovascular: Normal rate and intact distal pulses.        Patient has a normal corrected QT interval of 437  Pulmonary/Chest: No respiratory distress.  Abdominal: Normal appearance. She exhibits no distension.  Musculoskeletal: Normal range of motion.  Neurological: She is alert and oriented to person, place, and time. No cranial nerve deficit.  Skin: Skin is warm and dry. No rash noted.  Psychiatric: She has a normal mood and affect. Her behavior is  normal.    ED Course  Procedures (including critical care time)  Labs Reviewed  POCT I-STAT, CHEM 8 - Abnormal; Notable for the following:    Hemoglobin 11.9 (*)    HCT 35.0 (*)    All other components within normal limits  I-STAT, CHEM 8   No results found.   1. Migraine headache       MDM  After treatment in the ED the patient feels back to baseline and wants to go home.         Nelia Shi, MD 08/01/11 Barry Brunner

## 2011-08-01 NOTE — ED Notes (Signed)
The pt has had blurred vision  Since 1500.  She now has a headache.  Her bp has been high.  She has not been on bp meds for 2 years.  History of migraine headaches.

## 2011-08-01 NOTE — ED Notes (Signed)
Unable to establish IV access. IV team paged. 

## 2011-08-09 ENCOUNTER — Other Ambulatory Visit (HOSPITAL_COMMUNITY): Payer: Self-pay | Admitting: Nurse Practitioner

## 2011-08-09 DIAGNOSIS — Z1231 Encounter for screening mammogram for malignant neoplasm of breast: Secondary | ICD-10-CM

## 2011-09-09 ENCOUNTER — Ambulatory Visit (HOSPITAL_COMMUNITY): Payer: PRIVATE HEALTH INSURANCE

## 2011-09-20 ENCOUNTER — Ambulatory Visit (HOSPITAL_COMMUNITY)
Admission: RE | Admit: 2011-09-20 | Discharge: 2011-09-20 | Disposition: A | Discharge status: Home / Self Care | Payer: PRIVATE HEALTH INSURANCE | Source: Ambulatory Visit | Attending: Nurse Practitioner | Admitting: Nurse Practitioner

## 2011-09-20 DIAGNOSIS — Z1231 Encounter for screening mammogram for malignant neoplasm of breast: Secondary | ICD-10-CM | POA: Insufficient documentation

## 2012-10-13 ENCOUNTER — Ambulatory Visit (HOSPITAL_COMMUNITY)
Admission: RE | Admit: 2012-10-13 | Discharge: 2012-10-13 | Disposition: A | Discharge status: Home / Self Care | Payer: PRIVATE HEALTH INSURANCE | Source: Ambulatory Visit | Attending: Internal Medicine | Admitting: Internal Medicine

## 2012-10-13 ENCOUNTER — Other Ambulatory Visit (HOSPITAL_COMMUNITY): Payer: Self-pay | Admitting: Internal Medicine

## 2012-10-13 DIAGNOSIS — R0602 Shortness of breath: Secondary | ICD-10-CM

## 2012-10-13 DIAGNOSIS — J45909 Unspecified asthma, uncomplicated: Secondary | ICD-10-CM | POA: Insufficient documentation

## 2012-10-19 ENCOUNTER — Other Ambulatory Visit (HOSPITAL_COMMUNITY): Payer: Self-pay | Admitting: Internal Medicine

## 2012-10-19 DIAGNOSIS — Z1231 Encounter for screening mammogram for malignant neoplasm of breast: Secondary | ICD-10-CM

## 2012-10-24 ENCOUNTER — Ambulatory Visit (HOSPITAL_COMMUNITY)
Admission: RE | Admit: 2012-10-24 | Discharge: 2012-10-24 | Disposition: A | Discharge status: Home / Self Care | Payer: PRIVATE HEALTH INSURANCE | Source: Ambulatory Visit | Attending: Internal Medicine | Admitting: Internal Medicine

## 2012-10-24 DIAGNOSIS — Z1231 Encounter for screening mammogram for malignant neoplasm of breast: Secondary | ICD-10-CM | POA: Insufficient documentation

## 2015-09-15 ENCOUNTER — Observation Stay (HOSPITAL_COMMUNITY): Payer: PRIVATE HEALTH INSURANCE

## 2015-09-15 ENCOUNTER — Encounter (HOSPITAL_COMMUNITY): Payer: Self-pay | Admitting: Emergency Medicine

## 2015-09-15 ENCOUNTER — Observation Stay (HOSPITAL_COMMUNITY)
Admission: EM | Admit: 2015-09-15 | Discharge: 2015-09-17 | Disposition: A | Discharge status: Home / Self Care | Payer: Self-pay | Attending: Family Medicine | Admitting: Family Medicine

## 2015-09-15 DIAGNOSIS — A084 Viral intestinal infection, unspecified: Principal | ICD-10-CM | POA: Insufficient documentation

## 2015-09-15 DIAGNOSIS — I10 Essential (primary) hypertension: Secondary | ICD-10-CM | POA: Insufficient documentation

## 2015-09-15 DIAGNOSIS — R10811 Right upper quadrant abdominal tenderness: Secondary | ICD-10-CM | POA: Insufficient documentation

## 2015-09-15 DIAGNOSIS — R0602 Shortness of breath: Secondary | ICD-10-CM | POA: Insufficient documentation

## 2015-09-15 DIAGNOSIS — N179 Acute kidney failure, unspecified: Secondary | ICD-10-CM | POA: Insufficient documentation

## 2015-09-15 DIAGNOSIS — D869 Sarcoidosis, unspecified: Secondary | ICD-10-CM | POA: Insufficient documentation

## 2015-09-15 DIAGNOSIS — R0600 Dyspnea, unspecified: Secondary | ICD-10-CM | POA: Insufficient documentation

## 2015-09-15 DIAGNOSIS — R197 Diarrhea, unspecified: Secondary | ICD-10-CM

## 2015-09-15 DIAGNOSIS — E860 Dehydration: Secondary | ICD-10-CM | POA: Insufficient documentation

## 2015-09-15 HISTORY — DX: Acute kidney failure, unspecified: N17.9

## 2015-09-15 HISTORY — DX: Diarrhea, unspecified: R19.7

## 2015-09-15 LAB — URINALYSIS, ROUTINE W REFLEX MICROSCOPIC
Glucose, UA: NEGATIVE mg/dL
Ketones, ur: NEGATIVE mg/dL
Nitrite: NEGATIVE
Protein, ur: 30 mg/dL — AB
Specific Gravity, Urine: 1.021 (ref 1.005–1.030)
pH: 5 (ref 5.0–8.0)

## 2015-09-15 LAB — CBC
HCT: 34.8 % — ABNORMAL LOW (ref 36.0–46.0)
Hemoglobin: 11.2 g/dL — ABNORMAL LOW (ref 12.0–15.0)
MCH: 31.2 pg (ref 26.0–34.0)
MCHC: 32.2 g/dL (ref 30.0–36.0)
MCV: 96.9 fL (ref 78.0–100.0)
Platelets: 247 10*3/uL (ref 150–400)
RBC: 3.59 MIL/uL — ABNORMAL LOW (ref 3.87–5.11)
RDW: 12.1 % (ref 11.5–15.5)
WBC: 4.4 10*3/uL (ref 4.0–10.5)

## 2015-09-15 LAB — URINE MICROSCOPIC-ADD ON

## 2015-09-15 LAB — COMPREHENSIVE METABOLIC PANEL
ALBUMIN: 3.7 g/dL (ref 3.5–5.0)
ALK PHOS: 98 U/L (ref 38–126)
ALT: 62 U/L — ABNORMAL HIGH (ref 14–54)
ANION GAP: 13 (ref 5–15)
AST: 101 U/L — AB (ref 15–41)
BILIRUBIN TOTAL: 1.1 mg/dL (ref 0.3–1.2)
BUN: 15 mg/dL (ref 6–20)
CALCIUM: 9.2 mg/dL (ref 8.9–10.3)
CO2: 23 mmol/L (ref 22–32)
Chloride: 99 mmol/L — ABNORMAL LOW (ref 101–111)
Creatinine, Ser: 1.21 mg/dL — ABNORMAL HIGH (ref 0.44–1.00)
GFR calc Af Amer: 56 mL/min — ABNORMAL LOW (ref >60)
GFR, EST NON AFRICAN AMERICAN: 48 mL/min — AB (ref >60)
GLUCOSE: 117 mg/dL — AB (ref 65–99)
POTASSIUM: 3.5 mmol/L (ref 3.5–5.1)
Sodium: 135 mmol/L (ref 135–145)
TOTAL PROTEIN: 7.8 g/dL (ref 6.5–8.1)

## 2015-09-15 LAB — GASTROINTESTINAL PANEL BY PCR, STOOL (REPLACES STOOL CULTURE)

## 2015-09-15 LAB — LIPASE, BLOOD: Lipase: 23 U/L (ref 11–51)

## 2015-09-15 MED ORDER — ONDANSETRON HCL 4 MG/2ML IJ SOLN: 4.0000 mg | Freq: Four times a day (QID) | INTRAMUSCULAR | Status: DC | PRN

## 2015-09-15 MED ORDER — ACETAMINOPHEN 325 MG PO TABS: 650.0000 mg | ORAL_TABLET | Freq: Four times a day (QID) | ORAL | Status: DC | PRN

## 2015-09-15 MED ORDER — ENOXAPARIN SODIUM 40 MG/0.4ML ~~LOC~~ SOLN
40.0000 mg | SUBCUTANEOUS | Status: DC
  Administered 2015-09-15 – 2015-09-16 (×2): 40 mg via SUBCUTANEOUS
  Filled 2015-09-15 (×2): qty 0.4

## 2015-09-15 MED ORDER — SODIUM CHLORIDE 0.9 % IV BOLUS (SEPSIS)
1000.0000 mL | Freq: Once | INTRAVENOUS | Status: AC
  Administered 2015-09-15: 1000 mL via INTRAVENOUS

## 2015-09-15 MED ORDER — ACETAMINOPHEN 325 MG PO TABS
650.0000 mg | ORAL_TABLET | Freq: Once | ORAL | Status: AC
  Administered 2015-09-15: 650 mg via ORAL
  Filled 2015-09-15: qty 2

## 2015-09-15 MED ORDER — ACETAMINOPHEN 650 MG RE SUPP
650.0000 mg | Freq: Four times a day (QID) | RECTAL | Status: DC | PRN
Start: 2015-09-15 — End: 2015-09-17

## 2015-09-15 MED ORDER — ONDANSETRON HCL 4 MG PO TABS: 4.0000 mg | ORAL_TABLET | Freq: Four times a day (QID) | ORAL | Status: DC | PRN

## 2015-09-15 MED ORDER — SODIUM CHLORIDE 0.9 % IV SOLN
INTRAVENOUS | Status: DC
  Administered 2015-09-15 – 2015-09-17 (×4): via INTRAVENOUS

## 2015-09-15 NOTE — Progress Notes (Signed)
NURSING PROGRESS NOTE  Michelle Rios [id number] Admission Data: 09/15/2015 4:55 PM Attending Provider: Latrelle Dodrill, MD XQJ:JHERDE,YCXK, MD Code Status: FULL  Michelle Rios is a 60 y.o. female patient admitted from ED:  -No acute distress noted.  -No complaints of shortness of breath.  -No complaints of chest pain.   Cardiac Monitoring: N/A  Blood pressure 101/75, pulse 76, temperature 100.2 F (37.9 C), temperature source Oral, resp. rate 14, height 5\' 1"  (1.549 m), weight 117.935 kg (260 lb), SpO2 96 %.   IV Fluids:  IV in place, occlusive dsg intact without redness, IV cath antecubital right, condition patent and no redness normal saline.   Allergies:  Ivp dye  Past Medical History:   has a past medical history of Hypertension and Sarcoidosis (HCC).  Past Surgical History:   has past surgical history that includes Wrist surgery; Tubal ligation; Vaginal hysterectomy; and Knee cartilage surgery.  Social History:   reports that she has never smoked. She does not have any smokeless tobacco history on file. She reports that she does not drink alcohol or use illicit drugs.  Skin: Intact  Patient/Family orientated to room. Information packet given to patient/family. Admission inpatient armband information verified with patient/family to include name and date of birth and placed on patient arm. Side rails up x 2, fall assessment and education completed with patient/family. Patient/family able to verbalize understanding of risk associated with falls and verbalized understanding to call for assistance before getting out of bed. Call light within reach. Patient/family able to voice and demonstrate understanding of unit orientation instructions.    Will continue to evaluate and treat per MD orders.  , RN

## 2015-09-15 NOTE — ED Provider Notes (Signed)
CSN: [id number]     Arrival date & time 09/15/15  0259 History   First MD Initiated Contact with Patient 09/15/15 [phone]     Chief Complaint  Patient presents with  . Diarrhea     (Consider location/radiation/quality/duration/timing/severity/associated sxs/prior Treatment) HPI Comments: Patient here complaining of watery diarrhea 2 days. 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 Denies any fever or chills. No vomiting. Diffuse abdominal cramping. Denies any recent antibiotic use. Symptoms are persistent and associated weakness with standing. Denies any syncope or near-syncope. No treatment used prior to arrival  Patient is a 60 y.o. female presenting with diarrhea. The history is provided by the patient.  Diarrhea   Past Medical History  Diagnosis Date  . Hypertension    Past Surgical History  Procedure Laterality Date  . Wrist surgery    . Tubal ligation    . Vaginal hysterectomy    . Knee cartilage surgery     No family history on file. Social History  Substance Use Topics  . Smoking status: Never Smoker   . Smokeless tobacco: None  . Alcohol Use: No   OB History    No data available     Review of Systems  Gastrointestinal: Positive for diarrhea.  All other systems reviewed and are negative.     Allergies  Ivp dye  Home Medications   Prior to Admission medications   Not on File   BP 118/64 mmHg  Pulse 84  Temp(Src) 98.8 F (37.1 C) (Oral)  Resp 18  Ht 5\' 1"  (1.549 m)  Wt 117.935 kg  BMI 49.15 kg/m2  SpO2 95% Physical Exam  Constitutional: She is oriented to person, place, and time. She appears well-developed and well-nourished.  Non-toxic appearance. No distress.  HENT:  Head: Normocephalic and atraumatic.  Eyes: Conjunctivae, EOM and lids are normal. Pupils are equal, round, and reactive to light.  Neck: Normal range of motion. Neck supple. No tracheal deviation present. No thyroid mass present.  Cardiovascular: Normal rate,  regular rhythm and normal heart sounds.  Exam reveals no gallop.   No murmur heard. Pulmonary/Chest: Effort normal and breath sounds normal. No stridor. No respiratory distress. She has no decreased breath sounds. She has no wheezes. She has no rhonchi. She has no rales.  Abdominal: Soft. Normal appearance and bowel sounds are normal. She exhibits no distension. There is no tenderness. There is no rebound and no CVA tenderness.  Musculoskeletal: Normal range of motion. She exhibits no edema or tenderness.  Neurological: She is alert and oriented to person, place, and time. She has normal strength. No cranial nerve deficit or sensory deficit. GCS eye subscore is 4. GCS verbal subscore is 5. GCS motor subscore is 6.  Skin: Skin is warm and dry. No abrasion and no rash noted.  Psychiatric: She has a normal mood and affect. Her speech is normal and behavior is normal.  Nursing note and vitals reviewed.   ED Course  Procedures (including critical care time) Labs Review Labs Reviewed  COMPREHENSIVE METABOLIC PANEL - Abnormal; Notable for the following:    Chloride 99 (*)    Glucose, Bld 117 (*)    Creatinine, Ser 1.21 (*)    AST 101 (*)    ALT 62 (*)    GFR calc non Af Amer 48 (*)    GFR calc Af Amer 56 (*)    All other components within normal limits  CBC - Abnormal; Notable for the following:  RBC 3.59 (*)    Hemoglobin 11.2 (*)    HCT 34.8 (*)    All other components within normal limits  GASTROINTESTINAL PANEL BY PCR, STOOL (REPLACES STOOL CULTURE)  LIPASE, BLOOD  URINALYSIS, ROUTINE W REFLEX MICROSCOPIC (NOT AT [facility])    Imaging Review No results found. I have personally reviewed and evaluated these images and lab results as part of my medical decision-making.   EKG Interpretation None      MDM   Final diagnoses:  None    Patient given IV fluids here and now has spiked a temperature to 100.2 she endorses increased weakness. Typically treated with Tylenol. Does have  evidence of dehydration with a creatinine being 1.21. Will admit for observation    Lorre Nick, MD 09/15/15 1252

## 2015-09-15 NOTE — H&P (Signed)
Family Medicine Teaching [facility] Admission History and Physical Service Pager: [phone]  Patient name: Michelle Rios Medical record number: [id number] Date of birth: [birth year] Age: 60 y.o. Gender: female  Primary Care Provider: Quitman Livings, MD Consultants: Non  Code Status: Full  Chief Complaint: Diarrhea and epigastric abdominal pain   Assessment and Plan: Michelle Rios is a 60 y.o. female presenting with diarrhea and epigastric abdominal pain  PMH significant for sacordosis and HTN   Diarrhea and epigastric abdominal pain: Patient with 2 day history of epigastric abdominal pain and multiple watery stools. History of exposure to C. Difficile. Differential to include gastroenteritis viral versus bacterial. GI panel obtained in the ED negative. - Admit to family medicine teaching service Dr. Pollie Meyer - Gastrointestinal Panel, many GI bugs on this, all negative  - Will get C.difficule PCR - If worsening abdominal pain, would consider further imaging - Chest x-ray negative for any acute process - Will get AM CBC and BMET  - Vitals per floor - IV fluids @ NS 150 ml, received 2 bolus in the ED  - Zofran for nausea  - Waiting for C.diff pcr before considering antibiotic treatment   AKI: SCr 1.2 (baseline 0.7): likely secondary to intravascular depletion from copious volume loss over the past few days. - IVF as above - Will get AM BMET    FEN/GI: Clear liquid diet-- advance diet as tolerated  Prophylaxis: Lovenox   Disposition: Admit for observation.   History of Present Illness:  Michelle Rios is a 60 y.o. female presenting with diarrhea and epigastric pain.  Patient reports having watery stools since two days ago. She reports at least 20 bowel movements yesterday. She has not been able to eat anything since yesterday secondary to decreased appetite. She reports a fever in the 100s over the weekend. She has nausea without emesis. 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  She has not been on recent antibiotics  Review Of Systems: Per HPI with the following additions: She reports abdominal cramps and dizziness. No headaches, chills, chest pain, palpitations, dyspnea, dyspnea, frequency.  Otherwise the remainder of the systems were negative.  Patient Active Problem List   Diagnosis Date Noted  . Diarrhea 09/15/2015  . AKI (acute kidney injury) (HCC) 09/15/2015    Past Medical History: Past Medical History  Diagnosis Date  . Hypertension   . Sarcoidosis [facility])     Past Surgical History: Past Surgical History  Procedure Laterality Date  . Wrist surgery    . Tubal ligation    . Vaginal hysterectomy    . Knee cartilage surgery      Social History: Social History  Substance Use Topics  . Smoking status: Never Smoker   . Smokeless tobacco: None  . Alcohol Use: No   Please also refer to relevant sections of EMR.  Family History: Family History  Problem Relation Age of Onset  . Heart attack Father   . Heart disease Mother   . Diabetes Mother   . Stroke Paternal Grandmother   . Breast cancer Sister     Allergies and Medications: Allergies  Allergen Reactions  . Ivp Dye [Iodinated Diagnostic Agents] Anaphylaxis and Swelling   No current facility-administered medications on file prior to encounter.   No current outpatient prescriptions on file prior to encounter.    Objective: BP 125/58 mmHg  Pulse 75  Temp(Src) 98.5 F (36.9 C) (Oral)  Resp  18  Ht 5\' 1"  (1.549 m)  Wt 260 lb (117.935 kg)  BMI 49.15 kg/m2  SpO2 98% Exam: General: Lying in bed, NAD  Eyes: PERRLA  ENTM: Moist mucous membranes  Neck: No ROM  Cardiovascular: RRR, no murmurs, rubs, or gallops  Respiratory: Anterior auscultation. Mild wheezes in upper lung fields Abdomen: BS+, epigastric tenderness to palpation, no rebound, guarding MSK: No lower extremity edema, right ankle tenderness,  negative homans or calf tenderness  Skin: No rashes or ulcers  Neuro: Equal strength and sensation bilaterally  Psych: Alert and orientated x3    Labs and Imaging: CBC BMET   Recent Labs Lab 09/15/15 0325  WBC 4.4  HGB 11.2*  HCT 34.8*  PLT 247    Recent Labs Lab 09/15/15 0325  NA 135  K 3.5  CL 99*  CO2 23  BUN 15  CREATININE 1.21*  GLUCOSE 117*  CALCIUM 9.2     GI panel negative  Dg Chest 2 View  09/15/2015  CLINICAL DATA:  60 year old female with nausea, abdominal pain, and diarrhea. Patient presenting with shortness of breath. EXAM: CHEST  2 VIEW COMPARISON:  Radiograph dated 10/13/2012 FINDINGS: Two views of the chest do not demonstrate a focal consolidation. There is no pleural effusion or pneumothorax. Stable cardiac size. The aorta is tortuous. No acute osseous pathology identified. IMPRESSION: No active cardiopulmonary disease. Electronically Signed   By: 10/15/2012 M.D.   On: 09/15/2015 15:09      Asiyah 09/17/2015, MD 09/15/2015, 5:50 PM PGY-1, [facility] FPTS Intern pager: [phone], text pages welcome  I have seen and examined the patient. I have read and agree with the above note. My changes are noted in blue.  [phone], MD PGY-3, [facility] 09/15/2015, 7:53 PM

## 2015-09-15 NOTE — ED Notes (Signed)
Pt. reports diarrhea with nausea and generalized abdominal cramping onset Saturday , denies emesis or fever .

## 2015-09-15 NOTE — ED Notes (Signed)
RN attempt IV 2x. Will have another RN to attempt.

## 2015-09-16 ENCOUNTER — Observation Stay (HOSPITAL_COMMUNITY): Payer: PRIVATE HEALTH INSURANCE

## 2015-09-16 ENCOUNTER — Encounter (HOSPITAL_COMMUNITY): Payer: Self-pay | Admitting: General Practice

## 2015-09-16 DIAGNOSIS — R0600 Dyspnea, unspecified: Secondary | ICD-10-CM | POA: Insufficient documentation

## 2015-09-16 DIAGNOSIS — R10811 Right upper quadrant abdominal tenderness: Secondary | ICD-10-CM | POA: Insufficient documentation

## 2015-09-16 DIAGNOSIS — R197 Diarrhea, unspecified: Secondary | ICD-10-CM

## 2015-09-16 DIAGNOSIS — R0602 Shortness of breath: Secondary | ICD-10-CM | POA: Insufficient documentation

## 2015-09-16 DIAGNOSIS — N179 Acute kidney failure, unspecified: Secondary | ICD-10-CM

## 2015-09-16 LAB — CBC
HCT: 31.1 % — ABNORMAL LOW (ref 36.0–46.0)
Hemoglobin: 10.2 g/dL — ABNORMAL LOW (ref 12.0–15.0)
MCH: 31.9 pg (ref 26.0–34.0)
MCHC: 32.8 g/dL (ref 30.0–36.0)
MCV: 97.2 fL (ref 78.0–100.0)
Platelets: 226 K/uL (ref 150–400)
RBC: 3.2 MIL/uL — ABNORMAL LOW (ref 3.87–5.11)
RDW: 12.2 % (ref 11.5–15.5)
WBC: 3.5 K/uL — ABNORMAL LOW (ref 4.0–10.5)

## 2015-09-16 LAB — BASIC METABOLIC PANEL WITH GFR
Anion gap: 6 (ref 5–15)
BUN: 17 mg/dL (ref 6–20)
CO2: 23 mmol/L (ref 22–32)
Calcium: 8.2 mg/dL — ABNORMAL LOW (ref 8.9–10.3)
Chloride: 108 mmol/L (ref 101–111)
Creatinine, Ser: 0.92 mg/dL (ref 0.44–1.00)
GFR calc Af Amer: >60 mL/min
GFR calc non Af Amer: >60 mL/min
Glucose, Bld: 100 mg/dL — ABNORMAL HIGH (ref 65–99)
Potassium: 3.7 mmol/L (ref 3.5–5.1)
Sodium: 137 mmol/L (ref 135–145)

## 2015-09-16 LAB — C DIFFICILE QUICK SCREEN W PCR REFLEX
C DIFFICILE (CDIFF) TOXIN: NEGATIVE
C DIFFICLE (CDIFF) ANTIGEN: NEGATIVE
C Diff interpretation: NEGATIVE

## 2015-09-16 LAB — HEPATIC FUNCTION PANEL
ALBUMIN: 3.1 g/dL — AB (ref 3.5–5.0)
ALK PHOS: 71 U/L (ref 38–126)
ALT: 37 U/L (ref 14–54)
AST: 49 U/L — AB (ref 15–41)
BILIRUBIN TOTAL: 0.5 mg/dL (ref 0.3–1.2)
Bilirubin, Direct: 0.1 mg/dL (ref 0.1–0.5)
Indirect Bilirubin: 0.4 mg/dL (ref 0.3–0.9)
TOTAL PROTEIN: 6.5 g/dL (ref 6.5–8.1)

## 2015-09-16 LAB — SEDIMENTATION RATE: Sed Rate: 41 mm/h — ABNORMAL HIGH (ref 0–22)

## 2015-09-16 MED ORDER — ALBUTEROL SULFATE (2.5 MG/3ML) 0.083% IN NEBU
2.5000 mg | INHALATION_SOLUTION | RESPIRATORY_TRACT | Status: DC | PRN
  Administered 2015-09-17: 2.5 mg via RESPIRATORY_TRACT
  Filled 2015-09-16: qty 3

## 2015-09-16 MED ORDER — IPRATROPIUM-ALBUTEROL 0.5-2.5 (3) MG/3ML IN SOLN
3.0000 mL | Freq: Once | RESPIRATORY_TRACT | Status: AC
  Administered 2015-09-17: 3 mL via RESPIRATORY_TRACT
  Filled 2015-09-16: qty 3

## 2015-09-16 NOTE — Progress Notes (Signed)
Family Medicine Teaching Service Daily Progress Note Intern Pager: [phone]  Patient name: Michelle Rios Medical record number: [id number] Date of birth: 20-Jul-1956 Age: 60 y.o. Gender: female  Primary Care Provider: Antonietta Jewel, MD Consultants: None Code Status: Full   Pt Overview and Major Events to Date:    Assessment and Plan: ANTONIA JICHA is a 60 y.o. female presenting with diarrhea and epigastric abdominal pain PMH significant for sacordosis and HTN   Diarrhea and epigastric abdominal pain: Patient still with epigastric cramping. No tenderness on palpation, only a couple of stools overnight. 2 day history of epigastric abdominal pain and multiple watery stools. History of exposure to C. Difficile. Differential to include gastroenteritis viral versus bacterial. GI panel obtained in the ED negative. Likely viral gastroenteritis vs.possibly biliary colic due to presences of AST 101 and ALT 62  - Gastrointestinal Panel, many GI bugs on this, all negative and  C.difficule PCR  Negative  - Will get RUQ U/S and Liver panel  - ESR to consider Sacordosis exacerbation  - Zofran for nausea   Resolved AKI: SCr 1.2 (baseline 0.7)> 0.92: likely secondary to intravascular depletion from copious volume loss over the past few days. - IVF NS @ 150 ml   Wheezing: Sudden onset on Saturday.  - Duonebs q4h prn and albuterol prn   FEN/GI: Clear liquid diet-- advance diet as tolerated  Prophylaxis: Lovenox   Disposition: Discharge home tomorrow    Subjective:  States slightly nauseated. However was able to eat some chicken and mash potatoes last night. Slight wheezing on exam today, no hx per patient until this past Saturday. No chest pain, sob, vomiting.   Objective: Temp:  [98.3 F (36.8 C)-100 F (37.8 C)] 100 F (37.8 C) (02/21 1332) Pulse Rate:  [74-83] 81 (02/21 1332) Resp:  [14-18] 16 (02/21 1332) BP: (101-133)/(49-105) 125/63 mmHg (02/21 1332) SpO2:  [91 %-100 %] 97 %  (02/21 1332) Physical Exam: General: lying in bed, NAD  Cardiovascular: RRR, no murmurs, or gallops  Respiratory: slight wheezing throughout, otherwise CTAB  Abdomen: BS+, no ttp, no rebound,  Extremities: No lower extremity edema  Laboratory:  Recent Labs Lab 09/15/15 0325 09/16/15 0650  WBC 4.4 3.5*  HGB 11.2* 10.2*  HCT 34.8* 31.1*  PLT 247 226    Recent Labs Lab 09/15/15 0325 09/16/15 0650  NA 135 137  K 3.5 3.7  CL 99* 108  CO2 23 23  BUN 15 17  CREATININE 1.21* 0.92  CALCIUM 9.2 8.2*  PROT 7.8  --   BILITOT 1.1  --   ALKPHOS 98  --   ALT 62*  --   AST 101*  --   GLUCOSE 117* 100*     Imaging/Diagnostic Tests: Dg Chest 2 View  09/15/2015  CLINICAL DATA:  60 year old female with nausea, abdominal pain, and diarrhea. Patient presenting with shortness of breath. EXAM: CHEST  2 VIEW COMPARISON:  Radiograph dated 10/13/2012 FINDINGS: Two views of the chest do not demonstrate a focal consolidation. There is no pleural effusion or pneumothorax. Stable cardiac size. The aorta is tortuous. No acute osseous pathology identified. IMPRESSION: No active cardiopulmonary disease. Electronically Signed   By: Anner Crete M.D.   On: 09/15/2015 15:09     Gaylon Melchor Cletis Media, MD 09/16/2015, 1:33 PM PGY-1, [place] Intern pager: [phone], text pages welcome

## 2015-09-16 NOTE — Progress Notes (Signed)
Oral temp 100.0 Made dr aware (teaching service). No new orders at this time. Will continue to monitor pt.

## 2015-09-17 LAB — BASIC METABOLIC PANEL
ANION GAP: 9 (ref 5–15)
BUN: 7 mg/dL (ref 6–20)
CHLORIDE: 107 mmol/L (ref 101–111)
CO2: 25 mmol/L (ref 22–32)
Calcium: 8.2 mg/dL — ABNORMAL LOW (ref 8.9–10.3)
Creatinine, Ser: 0.81 mg/dL (ref 0.44–1.00)
GFR calc non Af Amer: >60 mL/min (ref >60)
Glucose, Bld: 95 mg/dL (ref 65–99)
POTASSIUM: 3.7 mmol/L (ref 3.5–5.1)
SODIUM: 141 mmol/L (ref 135–145)

## 2015-09-17 MED ORDER — ALBUTEROL SULFATE HFA 108 (90 BASE) MCG/ACT IN AERS: 2.0000 | INHALATION_SPRAY | Freq: Four times a day (QID) | RESPIRATORY_TRACT | Status: DC | PRN

## 2015-09-17 MED ORDER — PREDNISONE 20 MG PO TABS: 40.0000 mg | ORAL_TABLET | Freq: Every day | ORAL | Status: DC

## 2015-09-17 NOTE — Progress Notes (Signed)
Reviewed discharge paperwork and prescriptions given.  Pt denied any needs at this time.  Pt taken to discharge location via wheelchair.

## 2015-09-17 NOTE — Progress Notes (Signed)
Family Medicine Teaching Service Daily Progress Note Intern Pager: [phone]  Patient name: Michelle Rios Medical record number: [id number] Date of birth: 1955/09/22 Age: 60 y.o. Gender: female  Primary Care Provider: Antonietta Jewel, MD Consultants: None Code Status: Full   Pt Overview and Major Events to Date:    Assessment and Plan: Michelle Rios is a 60 y.o. female presenting with diarrhea and epigastric abdominal pain PMH significant for sacordosis and HTN   Resolved Diarrhea and epigastric abdominal pain: No diarrhea overnight, abdominal cramping improved. 2 day history of epigastric abdominal pain and multiple watery stools. History of exposure to C. Difficile. Differential to include gastroenteritis viral versus bacterial. GI panel obtained in the ED negative. Likely viral gastroenteritis vs.possibly biliary colic due to presences of AST 101 and ALT 62  - Gastrointestinal Panel, many GI bugs on this, all negative and  C.difficule PCR  Negative  - RUQ [place], with hepatic steatosis, liver enzymes trended down to normal.  - ESR slightly elevated at 44.  - Zofran for nausea   Resolved AKI: SCr 1.2 (baseline 0.7)> 0.92: likely secondary to intravascular depletion from copious volume loss over the past few days. - IVF NS @ 150 ml   Wheezing: Sudden onset on Saturday.  - Duonebs q4h prn and albuterol prn  - PFTs, home albuterol, and steroids for 5 days   FEN/GI: Clear liquid diet-- advance diet as tolerated  Prophylaxis: Lovenox   Disposition: Discharge home tomorrow    Subjective:  Patient doing well this morning. Feels ready to go home. Denies chest pain, nausea/vomiting, SOB, fever or chills.    Objective: Temp:  [99.2 F (37.3 C)-99.6 F (37.6 C)] 99.2 F (37.3 C) (02/22 0524) Pulse Rate:  [77-83] 77 (02/22 0524) Resp:  [18] 18 (02/22 0524) BP: (125-141)/(57-78) 141/57 mmHg (02/22 0524) SpO2:  [96 %-98 %] 98 % (02/22 1331) Physical Exam: General: lying in bed, NAD   Cardiovascular: RRR, no murmurs, or gallops  Respiratory: slight wheezing throughout, otherwise CTAB  Abdomen: BS+, no ttp, no rebound,  Extremities: No lower extremity edema  Laboratory:  Recent Labs Lab 09/15/15 0325 09/16/15 0650  WBC 4.4 3.5*  HGB 11.2* 10.2*  HCT 34.8* 31.1*  PLT 247 226    Recent Labs Lab 09/15/15 0325 09/16/15 0650 09/16/15 1325 09/17/15 0635  NA 135 137  --  141  K 3.5 3.7  --  3.7  CL 99* 108  --  107  CO2 23 23  --  25  BUN 15 17  --  7  CREATININE 1.21* 0.92  --  0.81  CALCIUM 9.2 8.2*  --  8.2*  PROT 7.8  --  6.5  --   BILITOT 1.1  --  0.5  --   ALKPHOS 98  --  71  --   ALT 62*  --  37  --   AST 101*  --  49*  --   GLUCOSE 117* 100*  --  95     Imaging/Diagnostic Tests: Dg Chest 2 View  09/15/2015  CLINICAL DATA:  60 year old female with nausea, abdominal pain, and diarrhea. Patient presenting with shortness of breath. EXAM: CHEST  2 VIEW COMPARISON:  Radiograph dated 10/13/2012 FINDINGS: Two views of the chest do not demonstrate a focal consolidation. There is no pleural effusion or pneumothorax. Stable cardiac size. The aorta is tortuous. No acute osseous pathology identified. IMPRESSION: No active cardiopulmonary disease. Electronically Signed   By: Anner Crete M.D.   On: 09/15/2015 15:09  US Abdomen Limited Ruq  09/16/2015  CLINICAL DATA:  Right upper quadrant pain EXAM: US ABDOMEN LIMITED - RIGHT UPPER QUADRANT COMPARISON:  None. FINDINGS: Gallbladder: No gallstones or wall thickening visualized. Common bile duct: Diameter: 3 mm Liver: The appearance of the liver suggests mild diffuse fatty infiltration. There are no focal abnormalities. IMPRESSION: Hepatic steatosis with no acute findings Electronically Signed   By: Skipper Cliche M.D.   On: 09/16/2015 17:03     Kadasia Kassing Cletis Media, MD 09/17/2015, 1:35 PM PGY-1, [place] Intern pager: [phone], text pages welcome

## 2015-09-17 NOTE — Progress Notes (Signed)
Ambulated pt on room air around nursing station, about 332ft.  Pt initially started with a brisk walk, but became very short of breath and had to pause half-way behind the nurse station.  Oxygen saturations decreased from 98% to 94% and heart rate increased to 120 during that time.  After pt resumed walking, pt stated "I felt a little dizzy when I started back, but I feel okay now that I am walking again." Encouraged pt to walk slower, but pt continued to show dyspnea.  Once pt arrived to room, patient sat and took several deep breathes.  Expiratory wheezing was heard on auscultation after ambulation.  Oxygen saturations returned to 98%.

## 2015-09-17 NOTE — Discharge Summary (Signed)
Family Medicine Teaching [facility] Discharge Summary  Patient name: Michelle Rios Medical record number: [id number] Date of birth: 1955-09-22 Age: 60 y.o. Gender: female Date of Admission: 09/15/2015  Date of Discharge: 09/17/2015 Admitting Physician: Latrelle Dodrill, MD  Primary Care Provider: Quitman Livings, MD Consultants: none  Indication for Hospitalization: Diarrhea and epigastric abdominal pain  Discharge Diagnoses/Problem List:  Sarcoidosis  HTN  Disposition: Home   Discharge Condition: Stable   Discharge Exam: General: lying in bed, NAD  Cardiovascular: RRR, no murmurs, or gallops  Respiratory: slight wheezing throughout, otherwise CTAB  Abdomen: BS+, no ttp, no rebound,  Extremities: No lower extremity edema  Brief Hospital Course:  Patient was admitted for 2 day history of epigastric pain, diarrhea, and multiple watery stools agree due to a viral gastroenteritis. GI panel was performed and was negative for Escherichia coli,Vibrio, Yersinia, Salmonella and Campylobacter. C. difficule PCR was negative. Over the course of the next couple of days patient received fluid resuscitation. Diarrhea subsided and patient else ready for discharge. She was also noted to be wheezy on admission. She remained so throughout admission and was provided with DuoNeb and albuterol with improvement. She was also started on prednisone at 40 mg to receive a 5 day course. Agent was able to comfortably walk without desaturations on pulse oximetry and was ready for discharge.  Issues for Follow Up:  1. Please follow PFTs outpatient  2. Sent home albuterol, and prednisone (last day 09/21/2015),please follow up wheezing    Significant Procedures: None  Significant Labs and Imaging:   Recent Labs Lab 09/15/15 0325 09/16/15 0650  WBC 4.4 3.5*  HGB 11.2* 10.2*  HCT 34.8* 31.1*  PLT 247 226    Recent Labs Lab 09/15/15 0325 09/16/15 0650 09/16/15 1325 09/17/15 0635  NA 135  137  --  141  K 3.5 3.7  --  3.7  CL 99* 108  --  107  CO2 23 23  --  25  GLUCOSE 117* 100*  --  95  BUN 15 17  --  7  CREATININE 1.21* 0.92  --  0.81  CALCIUM 9.2 8.2*  --  8.2*  ALKPHOS 98  --  71  --   AST 101*  --  49*  --   ALT 62*  --  37  --   ALBUMIN 3.7  --  3.1*  --    Dg Chest 2 View  09/15/2015  CLINICAL DATA:  60 year old female with nausea, abdominal pain, and diarrhea. Patient presenting with shortness of breath. EXAM: CHEST  2 VIEW COMPARISON:  Radiograph dated 10/13/2012 FINDINGS: Two views of the chest do not demonstrate a focal consolidation. There is no pleural effusion or pneumothorax. Stable cardiac size. The aorta is tortuous. No acute osseous pathology identified. IMPRESSION: No active cardiopulmonary disease. Electronically Signed   By: Elgie Collard M.D.   On: 09/15/2015 15:09   US Abdomen Limited Ruq  09/16/2015  CLINICAL DATA:  Right upper quadrant pain EXAM: US ABDOMEN LIMITED - RIGHT UPPER QUADRANT COMPARISON:  None. FINDINGS: Gallbladder: No gallstones or wall thickening visualized. Common bile duct: Diameter: 3 mm Liver: The appearance of the liver suggests mild diffuse fatty infiltration. There are no focal abnormalities. IMPRESSION: Hepatic steatosis with no acute findings Electronically Signed   By: Esperanza Heir M.D.   On: 09/16/2015 17:03    Results/Tests Pending at Time of Discharge: None  Discharge Medications:    Medication List    Notice    You have  not been prescribed any medications.      Discharge Instructions: Please refer to Patient Instructions section of EMR for full details.  Patient was counseled important signs and symptoms that should prompt return to medical care, changes in medications, dietary instructions, activity restrictions, and follow up appointments.   Follow-Up Appointments:   Berton Bon, MD 09/17/2015, 2:13 PM PGY-1, [facility]

## 2015-09-17 NOTE — Discharge Instructions (Signed)
You were admitted for diarrhea, this has since resolved. You were found have wheezing on exam. We started you on prednisone for this and have given you albuterol for your wheezing. You can use this every 4-6 hrs to help with your breathing.    Diarrhea Diarrhea is watery poop (stool). It can make you feel weak, tired, thirsty, or give you a dry mouth (signs of dehydration). Watery poop is a sign of another problem, most often an infection. It often lasts 2-3 days. It can last longer if it is a sign of something serious. Take care of yourself as told by your doctor. HOME CARE   Drink 1 cup (8 ounces) of fluid each time you have watery poop.  Do not drink the following fluids:  Those that contain simple sugars (fructose, glucose, galactose, lactose, sucrose, maltose).  Sports drinks.  Fruit juices.  Whole milk products.  Sodas.  Drinks with caffeine (coffee, tea, soda) or alcohol.  Oral rehydration solution may be used if the doctor says it is okay. You may make your own solution. Follow this recipe:   - teaspoon table salt.   teaspoon baking soda.   teaspoon salt substitute containing potassium chloride.  1 tablespoons sugar.  1 liter (34 ounces) of water.  Avoid the following foods:  High fiber foods, such as raw fruits and vegetables.  Nuts, seeds, and whole grain breads and cereals.   Those that are sweetened with sugar alcohols (xylitol, sorbitol, mannitol).  Try eating the following foods:  Starchy foods, such as rice, toast, pasta, low-sugar cereal, oatmeal, baked potatoes, crackers, and bagels.  Bananas.  Applesauce.  Eat probiotic-rich foods, such as yogurt and milk products that are fermented.  Wash your hands well after each time you have watery poop.  Only take medicine as told by your doctor.  Take a warm bath to help lessen burning or pain from having watery poop. GET HELP RIGHT AWAY IF:   You cannot drink fluids without throwing up  (vomiting).  You keep throwing up.  You have blood in your poop, or your poop looks black and tarry.  You do not pee (urinate) in 6-8 hours, or there is only a small amount of very dark pee.  You have belly (abdominal) pain that gets worse or stays in the same spot (localizes).  You are weak, dizzy, confused, or light-headed.  You have a very bad headache.  Your watery poop gets worse or does not get better.  You have a fever or lasting symptoms for more than 2-3 days.  You have a fever and your symptoms suddenly get worse. MAKE SURE YOU:   Understand these instructions.  Will watch your condition.  Will get help right away if you are not doing well or get worse.   This information is not intended to replace advice given to you by your health care provider. Make sure you discuss any questions you have with your health care provider.   Document Released: 12/29/2007 Document Revised: 08/02/2014 Document Reviewed: 03/19/2012 Elsevier Interactive Patient Education 2016 [employer].  Food Choices to Help Relieve Diarrhea, Adult When you have diarrhea, the foods you eat and your eating habits are very important. Choosing the right foods and drinks can help relieve diarrhea. Also, because diarrhea can last up to 7 days, you need to replace lost fluids and electrolytes (such as sodium, potassium, and chloride) in order to help prevent dehydration.  WHAT GENERAL GUIDELINES DO I NEED TO FOLLOW?  Slowly drink  1 cup (8 oz) of fluid for each episode of diarrhea. If you are getting enough fluid, your urine will be clear or pale yellow.  Eat starchy foods. Some good choices include white rice, white toast, pasta, low-fiber cereal, baked potatoes (without the skin), saltine crackers, and bagels.  Avoid large servings of any cooked vegetables.  Limit fruit to two servings per day. A serving is  cup or 1 small piece.  Choose foods with less than 2 g of fiber per serving.  Limit fats to  less than 8 tsp (38 g) per day.  Avoid fried foods.  Eat foods that have probiotics in them. Probiotics can be found in certain dairy products.  Avoid foods and beverages that may increase the speed at which food moves through the stomach and intestines (gastrointestinal tract). Things to avoid include:  High-fiber foods, such as dried fruit, raw fruits and vegetables, nuts, seeds, and whole grain foods.  Spicy foods and high-fat foods.  Foods and beverages sweetened with high-fructose corn syrup, honey, or sugar alcohols such as xylitol, sorbitol, and mannitol. WHAT FOODS ARE RECOMMENDED? Grains White rice. White, [place], or pita breads (fresh or toasted), including plain rolls, buns, or bagels. White pasta. Saltine, soda, or graham crackers. Pretzels. Low-fiber cereal. Cooked cereals made with water (such as cornmeal, farina, or cream cereals). Plain muffins. Matzo. Melba toast. Zwieback.  Vegetables Potatoes (without the skin). Strained tomato and vegetable juices. Most well-cooked and canned vegetables without seeds. Tender lettuce. Fruits Cooked or canned applesauce, apricots, cherries, fruit cocktail, grapefruit, peaches, pears, or plums. Fresh bananas, apples without skin, cherries, grapes, cantaloupe, grapefruit, peaches, oranges, or plums.  Meat and Other Protein Products Baked or boiled chicken. Eggs. Tofu. Fish. Seafood. Smooth peanut butter. Ground or well-cooked tender beef, ham, veal, lamb, pork, or poultry.  Dairy Plain yogurt, kefir, and unsweetened liquid yogurt. Lactose-free milk, buttermilk, or soy milk. Plain hard cheese. Beverages Sport drinks. Clear broths. Diluted fruit juices (except prune). Regular, caffeine-free sodas such as ginger ale. Water. Decaffeinated teas. Oral rehydration solutions. Sugar-free beverages not sweetened with sugar alcohols. Other Bouillon, broth, or soups made from recommended foods.  The items listed above may not be a complete list of  recommended foods or beverages. Contact your dietitian for more options. WHAT FOODS ARE NOT RECOMMENDED? Grains Whole grain, whole wheat, bran, or rye breads, rolls, pastas, crackers, and cereals. Wild or [place] rice. Cereals that contain more than 2 g of fiber per serving. Corn tortillas or taco shells. Cooked or dry oatmeal. Granola. Popcorn. Vegetables Raw vegetables. Cabbage, broccoli, Brussels sprouts, artichokes, baked beans, beet greens, corn, kale, legumes, peas, sweet potatoes, and yams. Potato skins. Cooked spinach and cabbage. Fruits Dried fruit, including raisins and dates. Raw fruits. Stewed or dried prunes. Fresh apples with skin, apricots, mangoes, pears, raspberries, and strawberries.  Meat and Other Protein Products Chunky peanut butter. Nuts and seeds. Beans and lentils. Tomasa Blase.  Dairy High-fat cheeses. Milk, chocolate milk, and beverages made with milk, such as milk shakes. Cream. Ice cream. Sweets and Desserts Sweet rolls, doughnuts, and sweet breads. Pancakes and waffles. Fats and Oils Butter. Cream sauces. Margarine. Salad oils. Plain salad dressings. Olives. Avocados.  Beverages Caffeinated beverages (such as coffee, tea, soda, or energy drinks). Alcoholic beverages. Fruit juices with pulp. Prune juice. Soft drinks sweetened with high-fructose corn syrup or sugar alcohols. Other Coconut. Hot sauce. Chili powder. Mayonnaise. Gravy. Cream-based or milk-based soups.  The items listed above may not be a complete list of foods  and beverages to avoid. Contact your dietitian for more information. WHAT SHOULD I DO IF I BECOME DEHYDRATED? Diarrhea can sometimes lead to dehydration. Signs of dehydration include dark urine and dry mouth and skin. If you think you are dehydrated, you should rehydrate with an oral rehydration solution. These solutions can be purchased at pharmacies, retail stores, or online.  Drink -1 cup (120-240 mL) of oral rehydration solution each time you have an  episode of diarrhea. If drinking this amount makes your diarrhea worse, try drinking smaller amounts more often. For example, drink 1-3 tsp (5-15 mL) every 5-10 minutes.  A general rule for staying hydrated is to drink 1-2 L of fluid per day. Talk to your health care provider about the specific amount you should be drinking each day. Drink enough fluids to keep your urine clear or pale yellow.   This information is not intended to replace advice given to you by your health care provider. Make sure you discuss any questions you have with your health care provider.   Document Released: 10/02/2003 Document Revised: 08/02/2014 Document Reviewed: 06/04/2013 Elsevier Interactive Patient Education [employer]

## 2015-11-21 ENCOUNTER — Other Ambulatory Visit: Payer: Self-pay | Admitting: Family Medicine

## 2015-11-21 DIAGNOSIS — Z1231 Encounter for screening mammogram for malignant neoplasm of breast: Secondary | ICD-10-CM

## 2015-12-15 ENCOUNTER — Ambulatory Visit: Payer: PRIVATE HEALTH INSURANCE

## 2016-05-01 ENCOUNTER — Ambulatory Visit (HOSPITAL_COMMUNITY)
Admission: EM | Admit: 2016-05-01 | Discharge: 2016-05-01 | Disposition: A | Discharge status: Home / Self Care | Payer: Self-pay | Attending: Internal Medicine | Admitting: Internal Medicine

## 2016-05-01 ENCOUNTER — Ambulatory Visit (INDEPENDENT_AMBULATORY_CARE_PROVIDER_SITE_OTHER): Payer: Self-pay

## 2016-05-01 ENCOUNTER — Encounter (HOSPITAL_COMMUNITY): Payer: Self-pay | Admitting: *Deleted

## 2016-05-01 DIAGNOSIS — J40 Bronchitis, not specified as acute or chronic: Secondary | ICD-10-CM | POA: Diagnosis not present

## 2016-05-01 DIAGNOSIS — J9801 Acute bronchospasm: Secondary | ICD-10-CM

## 2016-05-01 DIAGNOSIS — J309 Allergic rhinitis, unspecified: Secondary | ICD-10-CM

## 2016-05-01 MED ORDER — IPRATROPIUM-ALBUTEROL 0.5-2.5 (3) MG/3ML IN SOLN
RESPIRATORY_TRACT | Status: AC
  Filled 2016-05-01: qty 3

## 2016-05-01 MED ORDER — ALBUTEROL SULFATE HFA 108 (90 BASE) MCG/ACT IN AERS: 2.0000 | INHALATION_SPRAY | Freq: Four times a day (QID) | RESPIRATORY_TRACT | 0 refills | Status: DC | PRN

## 2016-05-01 MED ORDER — DEXAMETHASONE SODIUM PHOSPHATE 10 MG/ML IJ SOLN
10.0000 mg | Freq: Once | INTRAMUSCULAR | Status: AC
Start: 2016-05-01 — End: 2016-05-01
  Administered 2016-05-01: 10 mg via INTRAMUSCULAR

## 2016-05-01 MED ORDER — DEXAMETHASONE SODIUM PHOSPHATE 10 MG/ML IJ SOLN
INTRAMUSCULAR | Status: AC
  Filled 2016-05-01: qty 1

## 2016-05-01 MED ORDER — PREDNISONE 20 MG PO TABS: ORAL_TABLET | ORAL | 0 refills | Status: DC

## 2016-05-01 MED ORDER — SODIUM CHLORIDE 0.9 % IN NEBU
INHALATION_SOLUTION | RESPIRATORY_TRACT | Status: AC
  Filled 2016-05-01: qty 3

## 2016-05-01 MED ORDER — AZITHROMYCIN 250 MG PO TABS: ORAL_TABLET | ORAL | 0 refills | Status: DC

## 2016-05-01 MED ORDER — IPRATROPIUM-ALBUTEROL 0.5-2.5 (3) MG/3ML IN SOLN
3.0000 mL | Freq: Once | RESPIRATORY_TRACT | Status: AC
  Administered 2016-05-01: 3 mL via RESPIRATORY_TRACT

## 2016-05-01 MED ORDER — METHYLPREDNISOLONE ACETATE 40 MG/ML IJ SUSP
INTRAMUSCULAR | Status: AC
  Filled 2016-05-01: qty 1

## 2016-05-01 MED ORDER — METHYLPREDNISOLONE ACETATE 40 MG/ML IJ SUSP
40.0000 mg | Freq: Once | INTRAMUSCULAR | Status: AC
  Administered 2016-05-01: 40 mg via INTRAMUSCULAR

## 2016-05-01 NOTE — ED Notes (Signed)
Neb treatment in progress.

## 2016-05-01 NOTE — ED Provider Notes (Signed)
CSN: [id number]     Arrival date & time 05/01/16  1156 History   First MD Initiated Contact with Patient 05/01/16 1213     Chief Complaint  Patient presents with  . Shortness of Breath  . Cough   (Consider location/radiation/quality/duration/timing/severity/associated sxs/prior Treatment) Morbidly obese 60 year old female with a history of sarcoidosis states that 4 days ago she developed some cold symptoms such as runny nose and cough. Today she has developed some sweating, anterior chest pain associated with breathing only, shortness of breath, DOE and cough. She has a history of wheezing and has possession of albuterol inhaler but has not used it. Denies fever. She has a history of sarcoidosis.    Past Medical History:  Diagnosis Date  . AKI (acute kidney injury) (HCC) 09/15/2015  . Diarrhea 09/15/2015  . Hypertension   . Sarcoidosis [facility])    Past Surgical History:  Procedure Laterality Date  . KNEE CARTILAGE SURGERY    . TUBAL LIGATION    . VAGINAL HYSTERECTOMY    . WRIST SURGERY     Family History  Problem Relation Age of Onset  . Heart attack Father   . Heart disease Mother   . Diabetes Mother   . Stroke Paternal Grandmother   . Breast cancer Sister    Social History  Substance Use Topics  . Smoking status: Never Smoker  . Smokeless tobacco: Never Used  . Alcohol use No   OB History    No data available     Review of Systems  Constitutional: Positive for activity change. Negative for appetite change, chills, fatigue and fever.  HENT: Positive for congestion, postnasal drip and rhinorrhea. Negative for ear pain, facial swelling and sore throat.   Eyes: Negative.   Respiratory: Positive for cough, chest tightness and shortness of breath.   Cardiovascular: Negative.        Parasternal chest pain associated with cough only.  Gastrointestinal: Negative.   Genitourinary: Negative.   Musculoskeletal: Negative for neck pain and neck stiffness.  Skin: Negative for  pallor and rash.  Neurological: Negative.   All other systems reviewed and are negative.   Allergies  Ivp dye [iodinated diagnostic agents]  Home Medications   Prior to Admission medications   Medication Sig Start Date End Date Taking? Authorizing Provider  albuterol (PROVENTIL HFA;VENTOLIN HFA) 108 (90 Base) MCG/ACT inhaler Inhale 2 puffs into the lungs every 6 (six) hours as needed for wheezing or shortness of breath. 05/01/16   Hayden Rasmussen, NP  azithromycin (ZITHROMAX) 250 MG tablet 2 tabs po on day one, then one tablet po once daily on days 2-5. 05/01/16   Hayden Rasmussen, NP  predniSONE (DELTASONE) 20 MG tablet Take 3 tabs po on first day, 2 tabs second day, 2 tabs third day, 1 tab fourth day, 1 tab 5th day. Take with food. 05/01/16   Hayden Rasmussen, NP   Meds Ordered and Administered this Visit   Medications  ipratropium-albuterol (DUONEB) 0.5-2.5 (3) MG/3ML nebulizer solution 3 mL (3 mLs Nebulization Given 05/01/16 1254)  dexamethasone (DECADRON) injection 10 mg (10 mg Intramuscular Given 05/01/16 1253)  methylPREDNISolone acetate (DEPO-MEDROL) injection 40 mg (40 mg Intramuscular Given 05/01/16 1253)    BP 157/89 (BP Location: Left Wrist)   Pulse 85   Temp 98.8 F (37.1 C) (Oral)   Resp 20   Ht 5\' 1"  (1.549 m)   Wt 260 lb (117.9 kg)   SpO2 99%   BMI 49.13 kg/m  No data found.   Physical  Exam  Constitutional: She is oriented to person, place, and time. She appears well-developed and well-nourished. No distress.  Patient is fully alert, very talkative and able to complete sentences without having to stop. Having frequent episodes of coughing.  HENT:  Head: Normocephalic and atraumatic.  Right Ear: External ear normal.  Left Ear: External ear normal.  Mouth/Throat: No oropharyngeal exudate.  Oropharynx with copious amount of frothy PND. No exudates or swelling. Airway is patent.  Eyes: EOM are normal. Pupils are equal, round, and reactive to light.  Neck: Normal range of motion.  Neck supple.  Cardiovascular: Normal rate, regular rhythm and normal heart sounds.   Pulmonary/Chest: No respiratory distress. She has wheezes. She exhibits tenderness.  Bilateral Expiratory wheezes with prolonged expiratory phase. Cough with taking a deep breath. Call spasms.  Musculoskeletal: Normal range of motion. She exhibits no edema.  Lymphadenopathy:    She has no cervical adenopathy.  Neurological: She is alert and oriented to person, place, and time.  Skin: Skin is warm and dry. Capillary refill takes less than 2 seconds.  Nursing note and vitals reviewed.   Urgent Care Course   Clinical Course    Procedures (including critical care time)  Labs Review Labs Reviewed - No data to display  Imaging Review Dg Chest 2 View  Result Date: 05/01/2016 CLINICAL DATA:  59 year old female with a history of cough and wheezing EXAM: CHEST  2 VIEW COMPARISON:  09/15/2015, 10/13/2012 FINDINGS: Cardiomediastinal silhouette unchanged from prior. Tortuosity of the thoracic aorta. No pneumothorax or pleural effusion. No confluent airspace disease. Interstitial opacities are present, more pronounced than on the comparison. No displaced fracture.  Degenerative changes of the spine. IMPRESSION: Reticular opacity throughout the lungs, potentially bronchitis or atypical infection. No evidence of lobar pneumonia. Signed, Yvone Neu. Loreta Ave, DO [facility] Radiology Electronically Signed   By: Gilmer Mor D.O.   On: 05/01/2016 12:54     Visual Acuity Review  Right Eye Distance:   Left Eye Distance:   Bilateral Distance:    Right Eye Near:   Left Eye Near:    Bilateral Near:         MDM   1. Cough due to bronchospasm   2. Bronchitis   3. Acute allergic rhinitis, unspecified seasonality, unspecified trigger    Much improved after DuoNeb. Coughing spasms have decreased, now only faint intermittent wheezing on the right. Good air  movement. You sure inhaler, 2 puffs every 4-6 hours as needed for cough and wheezing. Take an antihistamine such as Allegra or Zyrtec daily to help with drainage. Start taking the prednisone tomorrow as directed, take with food. Follow-up with your primary care doctor next week. For any worsening new symptoms or problems, fever or increased shortness of breath did not seek medical attention promptly. Meds ordered this encounter  Medications  . ipratropium-albuterol (DUONEB) 0.5-2.5 (3) MG/3ML nebulizer solution 3 mL  . dexamethasone (DECADRON) injection 10 mg  . methylPREDNISolone acetate (DEPO-MEDROL) injection 40 mg  . albuterol (PROVENTIL HFA;VENTOLIN HFA) 108 (90 Base) MCG/ACT inhaler    Sig: Inhale 2 puffs into the lungs every 6 (six) hours as needed for wheezing or shortness of breath.    Dispense:  1 Inhaler    Refill:  0    Order Specific Question:   Supervising Provider    Answer:   Eustace Moore [[id number]]  . predniSONE (DELTASONE) 20 MG tablet    Sig: Take 3 tabs po on first day, 2  tabs second day, 2 tabs third day, 1 tab fourth day, 1 tab 5th day. Take with food.    Dispense:  9 tablet    Refill:  0    Order Specific Question:   Supervising Provider    Answer:   Eustace Moore [[id number]]  . azithromycin (ZITHROMAX) 250 MG tablet    Sig: 2 tabs po on day one, then one tablet po once daily on days 2-5.    Dispense:  6 tablet    Refill:  0    Order Specific Question:   Supervising Provider    Answer:   Eustace Moore [[id number]]       Hayden Rasmussen, NP 05/01/16 1308

## 2016-05-01 NOTE — ED Triage Notes (Signed)
C/O starting with runny nose and cough 3 days ago.  This AM felt more SOB, with central chest pain, back pain, and right side pain.  Cough has been dry; became productive last night.  Felt feverish today.

## 2016-05-01 NOTE — Discharge Instructions (Signed)
You sure inhaler, 2 puffs every 4-6 hours as needed for cough and wheezing. Take an antihistamine such as Allegra or Zyrtec daily to help with drainage. Start taking the prednisone tomorrow as directed, take with food. Follow-up with your primary care doctor next week. For any worsening new symptoms or problems, fever or increased shortness of breath did not seek medical attention promptly.

## 2016-05-13 ENCOUNTER — Ambulatory Visit (HOSPITAL_COMMUNITY)
Admission: EM | Admit: 2016-05-13 | Discharge: 2016-05-13 | Disposition: A | Discharge status: Home / Self Care | Payer: PRIVATE HEALTH INSURANCE | Attending: Family Medicine | Admitting: Family Medicine

## 2016-05-13 ENCOUNTER — Encounter (HOSPITAL_COMMUNITY): Payer: Self-pay | Admitting: Emergency Medicine

## 2016-05-13 DIAGNOSIS — M79671 Pain in right foot: Secondary | ICD-10-CM

## 2016-05-13 NOTE — ED Provider Notes (Signed)
CSN: [id number]     Arrival date & time 05/13/16  1031 History   First MD Initiated Contact with Patient 05/13/16 1156     Chief Complaint  Patient presents with  . Foot Pain    right   (Consider location/radiation/quality/duration/timing/severity/associated sxs/prior Treatment) Obese 60 y o F stepped off a curb onto ground and experienced sudden acute stabbing, sharp pain to the plantar aspect of the distal arch. Occurred 3 d ago. She has been ambulating with a limp since the incident. She is also been using hot water which has caused some swelling of the foot. This has not helped. She states it feels as though she may have stepped on a nail.      Past Medical History:  Diagnosis Date  . AKI (acute kidney injury) (HCC) 09/15/2015  . Diarrhea 09/15/2015  . Hypertension   . Sarcoidosis [facility])    Past Surgical History:  Procedure Laterality Date  . KNEE CARTILAGE SURGERY    . TUBAL LIGATION    . VAGINAL HYSTERECTOMY    . WRIST SURGERY     Family History  Problem Relation Age of Onset  . Heart attack Father   . Heart disease Mother   . Diabetes Mother   . Stroke Paternal Grandmother   . Breast cancer Sister    Social History  Substance Use Topics  . Smoking status: Never Smoker  . Smokeless tobacco: Never Used  . Alcohol use No   OB History    No data available     Review of Systems  Constitutional: Negative.   Respiratory: Negative.   Gastrointestinal: Negative.   Skin: Negative for color change, rash and wound.  Neurological: Negative.   Psychiatric/Behavioral: Negative.   All other systems reviewed and are negative.   Allergies  Ivp dye [iodinated diagnostic agents]  Home Medications   Prior to Admission medications   Medication Sig Start Date End Date Taking? Authorizing Provider  albuterol (PROVENTIL HFA;VENTOLIN HFA) 108 (90 Base) MCG/ACT inhaler Inhale 2 puffs into the lungs every 6 (six) hours as needed for wheezing or shortness of breath. 05/01/16    Hayden Rasmussen, NP   Meds Ordered and Administered this Visit  Medications - No data to display  BP 165/98 (BP Location: Left Wrist)   Pulse 87   Temp 98.4 F (36.9 C) (Oral)   SpO2 98%  No data found.   Physical Exam  Constitutional: She is oriented to person, place, and time. She appears well-developed and well-nourished. No distress.  HENT:  Head: Normocephalic and atraumatic.  Eyes: EOM are normal.  Neck: Normal range of motion. Neck supple.  Cardiovascular: Normal rate.   Pulmonary/Chest: Effort normal.  Musculoskeletal: Normal range of motion. She exhibits no edema or deformity.  Right foot exam demonstrates no discoloration, swelling, edema or tenderness to the ankle or dorsum of the foot or heel. There is point tenderness to the plantar aspect of the distal arch. No erythema, swelling, skin lesion, wound or observed structural changes to indicate an easily identified lesion or etiology for the pain. Palpation and stretching of the skin over the plantar aspect produces pain and tenderness and a small area. No palpable foreign bodies, bumps, lumps or other. Deep palpation reveals no apparent foreign body or deformity. No bony tenderness. Full range of motion of the ankle, foot and toes.  Lymphadenopathy:    She has no cervical adenopathy.  Neurological: She is alert and oriented to person, place, and time. No cranial nerve deficit.  Skin: Skin is warm and dry. No rash noted. No erythema.  Nursing note and vitals reviewed.   Urgent Care Course   Clinical Course    Procedures (including critical care time)  Labs Review Labs Reviewed - No data to display  Imaging Review No results found.   Visual Acuity Review  Right Eye Distance:   Left Eye Distance:   Bilateral Distance:    Right Eye Near:   Left Eye Near:    Bilateral Near:         MDM   1. Foot pain, right   No signs of infection or foreign body. No apparent structural changes or observed  injury. Likely you have an injury, bruise or strain of the plantar fascia or ligament. Apply the cold compresses as directed and may take Aleve or ibuprofen for pain also put a full-length shoe insert for comfort. F/U with your PCP as needed     Hayden Rasmussen, NP 05/13/16 1220

## 2016-05-13 NOTE — ED Triage Notes (Signed)
Pt states she was just walking on Monday when she felt a sudden sharp pain in the arch of her foot.  She states she did not step on anything and did not twist her foot.  The pain has not gone away since that time, but she has not taken anything OTC for the pain.

## 2016-05-13 NOTE — Discharge Instructions (Signed)
Likely you have an injury, bruise or strain of the plantar fascitis or ligament. Apply the cold compresses as directed and may take Aleve or ibuprofen for pain also put a full-length shoe insert for comfort.

## 2016-06-07 ENCOUNTER — Ambulatory Visit (INDEPENDENT_AMBULATORY_CARE_PROVIDER_SITE_OTHER): Payer: Self-pay

## 2016-06-07 ENCOUNTER — Ambulatory Visit (HOSPITAL_COMMUNITY)
Admission: EM | Admit: 2016-06-07 | Discharge: 2016-06-07 | Disposition: A | Discharge status: Home / Self Care | Payer: PRIVATE HEALTH INSURANCE | Attending: Emergency Medicine | Admitting: Emergency Medicine

## 2016-06-07 ENCOUNTER — Encounter (HOSPITAL_COMMUNITY): Payer: Self-pay | Admitting: Emergency Medicine

## 2016-06-07 DIAGNOSIS — M25511 Pain in right shoulder: Secondary | ICD-10-CM

## 2016-06-07 MED ORDER — TRAMADOL HCL 50 MG PO TABS: 50.0000 mg | ORAL_TABLET | Freq: Four times a day (QID) | ORAL | 0 refills | Status: DC | PRN

## 2016-06-07 MED ORDER — DEXAMETHASONE SODIUM PHOSPHATE 10 MG/ML IJ SOLN
INTRAMUSCULAR | Status: AC
  Filled 2016-06-07: qty 1

## 2016-06-07 MED ORDER — DEXAMETHASONE SODIUM PHOSPHATE 10 MG/ML IJ SOLN
10.0000 mg | Freq: Once | INTRAMUSCULAR | Status: AC
  Administered 2016-06-07: 10 mg via INTRAMUSCULAR

## 2016-06-07 MED ORDER — PREDNISONE 20 MG PO TABS: ORAL_TABLET | ORAL | 0 refills | Status: DC

## 2016-06-07 NOTE — Discharge Instructions (Signed)
Wear the arm sling for 2 days only. You may remove it periodically and move the shoulder around to keep it loose. Apply ice for the next 2 or 3 days. Take medication as directed. Take it with food. Call your orthopedist tomorrow for an appointment and follow-up.

## 2016-06-07 NOTE — ED Notes (Signed)
Patient requesting pain shot.  Told patient and friend I would ask about pain medicine.  Reminded patient she received to scripts to help with pain.  Patient aware provider in another treatment room.  Patient waiting for sling.  Patient requesting door to be open, compromised leaving door partially open and explained the concern for patient confidentiality.    Patient and friend are laughing and joking.

## 2016-06-07 NOTE — ED Provider Notes (Signed)
CSN: [id number]     Arrival date & time 06/07/16  1633 History   First MD Initiated Contact with Patient 06/07/16 1915     Chief Complaint  Patient presents with  . Shoulder Pain   (Consider location/radiation/quality/duration/timing/severity/associated sxs/prior Treatment) Obese 60 year old female states that yesterday while sitting in church doing nothing she experienced sudden acute pain to the right shoulder. Denies any known trauma. Denies fall or activity that would be suspected for causing pain. Denies work involving pulling, pushing or lifting. Denies working at a computer or phone. She works as a Lawyer. States she is unable to abduct the arm (beyond 80).      Past Medical History:  Diagnosis Date  . AKI (acute kidney injury) (HCC) 09/15/2015  . Diarrhea 09/15/2015  . Hypertension   . Sarcoidosis [facility])    Past Surgical History:  Procedure Laterality Date  . KNEE CARTILAGE SURGERY    . TUBAL LIGATION    . VAGINAL HYSTERECTOMY    . WRIST SURGERY     Family History  Problem Relation Age of Onset  . Heart attack Father   . Heart disease Mother   . Diabetes Mother   . Stroke Paternal Grandmother   . Breast cancer Sister    Social History  Substance Use Topics  . Smoking status: Never Smoker  . Smokeless tobacco: Never Used  . Alcohol use No   OB History    No data available     Review of Systems  Constitutional: Negative for activity change, chills and fever.  HENT: Negative.   Respiratory: Negative.   Cardiovascular: Negative.   Musculoskeletal: Positive for arthralgias. Negative for joint swelling.       As per HPI  Skin: Negative for color change, pallor and rash.  Neurological: Negative.   All other systems reviewed and are negative.   Allergies  Ivp dye [iodinated diagnostic agents]  Home Medications   Prior to Admission medications   Medication Sig Start Date End Date Taking? Authorizing Provider  albuterol (PROVENTIL HFA;VENTOLIN HFA) 108 (90  Base) MCG/ACT inhaler Inhale 2 puffs into the lungs every 6 (six) hours as needed for wheezing or shortness of breath. 05/01/16   Hayden Rasmussen, NP  predniSONE (DELTASONE) 20 MG tablet Take 3 tabs po daily. Take with food. 06/07/16   Hayden Rasmussen, NP  traMADol (ULTRAM) 50 MG tablet Take 1 tablet (50 mg total) by mouth every 6 (six) hours as needed for moderate pain. 06/07/16   Hayden Rasmussen, NP   Meds Ordered and Administered this Visit  Medications - No data to display  BP (!) 146/111 (BP Location: Left Wrist)   Pulse 91   Temp 98.8 F (37.1 C) (Oral)   Resp 20   SpO2 96%  No data found.   Physical Exam  Constitutional: She is oriented to person, place, and time. She appears well-developed and well-nourished. No distress.  HENT:  Head: Normocephalic and atraumatic.  Eyes: EOM are normal.  Neck: Normal range of motion. Neck supple.  Musculoskeletal: She exhibits tenderness. She exhibits no edema.  Decreased range of motion of the right shoulder. Able to abduct to 80. Passive motion unable to abduct arm above 90. Pain is located to the top of the shoulder over the acromioclavicular joint and along the trapezius muscle. There is anterior and posterior joint line tenderness. Tenderness to the lateral pectoralis tendon. Pain with internal and external rotation. Distal neurovascular and sensory is grossly intact.  Lymphadenopathy:    She has  no cervical adenopathy.  Neurological: She is alert and oriented to person, place, and time. No cranial nerve deficit.  Skin: Skin is warm and dry.  Nursing note and vitals reviewed.   Urgent Care Course   Clinical Course     Procedures (including critical care time)  Labs Review Labs Reviewed - No data to display  Imaging Review Dg Shoulder Right  Result Date: 06/07/2016 CLINICAL DATA:  Right shoulder pain.  Limited range of motion. EXAM: RIGHT SHOULDER - 2+ VIEW COMPARISON:  None. FINDINGS: No fracture or dislocation the right shoulder. There  is osteophyte formation along the inferior aspect of the humeral head. Acromioclavicular joint is approximated. IMPRESSION: No acute fracture or dislocation of the right shoulder. Mild glenohumeral osteoarthrosis. Electronically Signed   By: Deatra Robinson M.D.   On: 06/07/2016 19:47     Visual Acuity Review  Right Eye Distance:   Left Eye Distance:   Bilateral Distance:    Right Eye Near:   Left Eye Near:    Bilateral Near:         MDM   1. Acute pain of right shoulder   Exam suggest impingement syndrome versus rotator cuff arthropathy. The patient gives no history of injury or activity that was suggest an etiology. We will treat conservatively and have follow-up with orthopedist. Wear the arm sling for 2 days only. You may remove it periodically and move the shoulder around to keep it loose. Apply ice for the next 2 or 3 days. Take medication as directed. Take it with food. Call your orthopedist tomorrow for an appointment and follow-up. Meds ordered this encounter  Medications  . predniSONE (DELTASONE) 20 MG tablet    Sig: Take 3 tabs po daily. Take with food.    Dispense:  18 tablet    Refill:  0    Order Specific Question:   Supervising Provider    Answer:   Domenick Gong [[id number]]  . traMADol (ULTRAM) 50 MG tablet    Sig: Take 1 tablet (50 mg total) by mouth every 6 (six) hours as needed for moderate pain.    Dispense:  15 tablet    Refill:  0    Order Specific Question:   Supervising Provider    Answer:   Domenick Gong [[id number]]   Decadron 10 mg IM     Hayden Rasmussen, NP 06/07/16 2032    Hayden Rasmussen, NP 06/07/16 2047

## 2016-06-07 NOTE — ED Notes (Signed)
Notified Onalee Hua NP

## 2016-06-07 NOTE — ED Notes (Signed)
Michelle Rios emt applied sling to right arm

## 2016-06-07 NOTE — ED Triage Notes (Signed)
The patient presented to the [name] with a complaint of right shoulder pain. The patient stated that her right shoulder is sore and that it started yesterday. The patient did not know of any injury to the shoulder.

## 2016-06-21 ENCOUNTER — Emergency Department (HOSPITAL_COMMUNITY)
Admission: EM | Admit: 2016-06-21 | Discharge: 2016-06-21 | Disposition: A | Discharge status: Home / Self Care | Payer: PRIVATE HEALTH INSURANCE | Attending: Emergency Medicine | Admitting: Emergency Medicine

## 2016-06-21 ENCOUNTER — Emergency Department (HOSPITAL_COMMUNITY): Payer: PRIVATE HEALTH INSURANCE

## 2016-06-21 ENCOUNTER — Encounter (HOSPITAL_COMMUNITY): Payer: Self-pay | Admitting: *Deleted

## 2016-06-21 DIAGNOSIS — I10 Essential (primary) hypertension: Secondary | ICD-10-CM | POA: Insufficient documentation

## 2016-06-21 DIAGNOSIS — Z79899 Other long term (current) drug therapy: Secondary | ICD-10-CM | POA: Insufficient documentation

## 2016-06-21 DIAGNOSIS — M79671 Pain in right foot: Secondary | ICD-10-CM | POA: Insufficient documentation

## 2016-06-21 DIAGNOSIS — Y939 Activity, unspecified: Secondary | ICD-10-CM | POA: Insufficient documentation

## 2016-06-21 DIAGNOSIS — X501XXA Overexertion from prolonged static or awkward postures, initial encounter: Secondary | ICD-10-CM | POA: Insufficient documentation

## 2016-06-21 DIAGNOSIS — Y929 Unspecified place or not applicable: Secondary | ICD-10-CM | POA: Insufficient documentation

## 2016-06-21 DIAGNOSIS — Y999 Unspecified external cause status: Secondary | ICD-10-CM | POA: Insufficient documentation

## 2016-06-21 MED ORDER — TRAMADOL HCL 50 MG PO TABS: 50.0000 mg | ORAL_TABLET | Freq: Four times a day (QID) | ORAL | 0 refills | Status: DC | PRN

## 2016-06-21 MED ORDER — TRAMADOL HCL 50 MG PO TABS
50.0000 mg | ORAL_TABLET | Freq: Once | ORAL | Status: AC
  Administered 2016-06-21: 50 mg via ORAL

## 2016-06-21 NOTE — Discharge Instructions (Signed)
Please call Dr. Greig Right office to schedule an appointment for continued foot pain.  Take Tramadol every 6 hours as needed for pain.  Return to the ED for sudden worsening pain, inability to walk, numbness, weakness, or any new or concerning symptoms.

## 2016-06-21 NOTE — ED Notes (Signed)
Patient returned from xray.

## 2016-06-21 NOTE — ED Provider Notes (Signed)
[facility]-EMERGENCY DEPT Provider Note   CSN: [id number] Arrival date & time: 06/21/16  1332  By signing my name below, I, Placido Sou, attest that this documentation has been prepared under the direction and in the presence of Cheri Fowler, PA-C. Electronically Signed: Placido Sou, ED Scribe. 06/21/16. 2:16 PM.  History   Chief Complaint Chief Complaint  Patient presents with  . Foot Pain   HPI HPI Comments: Michelle Rios is a 60 y.o. female who presents to the Emergency Department complaining of worsening, moderate, right lateral foot pain x 3 weeks. She states she was stepping off a curb and inverted her right ankle resulting in her pain. She states she was initially evaluated at [facility] and denies having had imaging performed at the time. She took tramadol initially which provided mild short term relief. Pt states her pain has progressively worsened and worsens when bearing weight or with ambulation. She confirms being ambulatory since the incident. Pt denies any other associated symptoms at this time.   The history is provided by the patient. No language interpreter was used.    Past Medical History:  Diagnosis Date  . AKI (acute kidney injury) (HCC) 09/15/2015  . Diarrhea 09/15/2015  . Hypertension   . Sarcoidosis [facility])     Patient Active Problem List   Diagnosis Date Noted  . Right upper quadrant abdominal tenderness   . SOB (shortness of breath)   . Diarrhea 09/15/2015  . AKI (acute kidney injury) (HCC) 09/15/2015    Past Surgical History:  Procedure Laterality Date  . KNEE CARTILAGE SURGERY    . TUBAL LIGATION    . VAGINAL HYSTERECTOMY    . WRIST SURGERY      OB History    No data available       Home Medications    Prior to Admission medications   Medication Sig Start Date End Date Taking? Authorizing Provider  albuterol (PROVENTIL HFA;VENTOLIN HFA) 108 (90 Base) MCG/ACT inhaler Inhale 2 puffs into the lungs every 6 (six) hours as needed for wheezing or  shortness of breath. 05/01/16   Hayden Rasmussen, NP  predniSONE (DELTASONE) 20 MG tablet Take 3 tabs po daily. Take with food. 06/07/16   Hayden Rasmussen, NP  traMADol (ULTRAM) 50 MG tablet Take 1 tablet (50 mg total) by mouth every 6 (six) hours as needed. 06/21/16   Cheri Fowler, PA-C    Family History Family History  Problem Relation Age of Onset  . Heart attack Father   . Heart disease Mother   . Diabetes Mother   . Stroke Paternal Grandmother   . Breast cancer Sister     Social History Social History  Substance Use Topics  . Smoking status: Never Smoker  . Smokeless tobacco: Never Used  . Alcohol use No     Allergies   Ivp dye [iodinated diagnostic agents]   Review of Systems Review of Systems  Musculoskeletal: Positive for arthralgias and myalgias.  Skin: Negative for color change and wound.  Neurological: Negative for weakness and numbness.  All other systems reviewed and are negative.  Physical Exam Updated Vital Signs BP (!) 184/107 (BP Location: Left Arm)   Pulse 80   Temp 98.2 F (36.8 C) (Oral)   Resp 20   Ht 5\' 1"  (1.549 m)   Wt 117.9 kg   SpO2 93%   BMI 49.13 kg/m   Physical Exam  Constitutional: She is oriented to person, place, and time. She appears well-developed and well-nourished.  HENT:  Head: Normocephalic and atraumatic.  Right Ear: External ear normal.  Left Ear: External ear normal.  Eyes: Conjunctivae are normal. No scleral icterus.  Neck: No tracheal deviation present.  Cardiovascular:  Pulses:      Dorsalis pedis pulses are 2+ on the right side, and 2+ on the left side.  Pulmonary/Chest: Effort normal. No respiratory distress.  Abdominal: She exhibits no distension.  Musculoskeletal: Normal range of motion. She exhibits tenderness.  Right foot: No erythema, warmth, bruising.  TTP over lateral inferior calcaneus with overlying swelling.  No medial/lateral malleolus tenderness. FAROM in ankle flexion and extension.  Able to move all toes  without difficulty.  Neurological: She is alert and oriented to person, place, and time.  5/5 strength in ankle dorsi/plantar flexion bilaterally.  Sensation intact to light touch.  Antalgic gait.  Skin: Skin is warm and dry.  Psychiatric: She has a normal mood and affect. Her behavior is normal.   ED Treatments / Results  Labs (all labs ordered are listed, but only abnormal results are displayed) Labs Reviewed - No data to display  EKG  EKG Interpretation None       Radiology Dg Foot Complete Right  Result Date: 06/21/2016 CLINICAL DATA:  Right lateral foot pain for 3 weeks. Trauma 3 weeks ago. EXAM: RIGHT FOOT COMPLETE - 3+ VIEW COMPARISON:  None. FINDINGS: Diffuse soft tissue swelling is seen. Two calcifications are located along the inferior aspect of the mid calcaneus, more posteriorly located than expected for an os perineum. These are relatively well corticated and no donor site is identified. These are not thought to represent acute fracture fragments. Mild irregularity at the base of the fifth metatarsal is identified without acute fracture, likely a chronic finding. No other abnormalities. IMPRESSION: 1. Diffuse soft tissue swelling. 2. Two calcifications along the inferior aspect of the mid calcaneus are not in a typical location for an accessory ossicle. No donor site is seen and this finding is not favored to represent acute fracture fragments. Recommend clinical correlation in this region for point tenderness. Recommend comparison to outside films if available to determine chronicity of the finding. Electronically Signed   By: Gerome Sam III M.D   On: 06/21/2016 14:43    Procedures Procedures  COORDINATION OF CARE: 2:16 PM Discussed next steps with pt. Pt verbalized understanding and is agreeable with the plan.    Medications Ordered in ED Medications  traMADol (ULTRAM) tablet 50 mg (50 mg Oral Given 06/21/16 1441)     Initial Impression / Assessment and Plan /  ED Course  I have reviewed the triage vital signs and the nursing notes.  Pertinent labs & imaging results that were available during my care of the patient were reviewed by me and considered in my medical decision making (see chart for details).  Clinical Course    Patient presents with persistent right foot pain after inversion injury 1 month ago.  She is ambulatory.  Neurovascularly intact.  No signs of infection.  Xrays show calcifications along inferior mid calcaneus, appears like possible old/healing avulsion.  No acute findings.  Patient given postop shoe and Tramadol prescription.  Discussed follow up with orthopedics.  Return precautions discussed.  Stable for discharge.   Final Clinical Impressions(s) / ED Diagnoses   Final diagnoses:  Right foot pain    New Prescriptions Discharge Medication List as of 06/21/2016  3:12 PM     I personally performed the services described in this documentation, which was scribed in my  presence. The recorded information has been reviewed and is accurate.    Cheri Fowler, PA-C 06/21/16 1604    Raeford Razor, MD 06/29/16 [phone]

## 2016-06-21 NOTE — ED Triage Notes (Signed)
PT reports RT foot pain started 3 weeks ago after she stepped off a curb . Pt reports pain to RT foot has been on going. Pt ambulatory to room.

## 2016-07-07 ENCOUNTER — Ambulatory Visit: Payer: Self-pay | Admitting: Physical Therapy

## 2016-07-08 ENCOUNTER — Ambulatory Visit: Payer: Self-pay | Attending: Student | Admitting: Physical Therapy

## 2016-07-08 ENCOUNTER — Encounter: Payer: Self-pay | Admitting: Physical Therapy

## 2016-07-08 DIAGNOSIS — R262 Difficulty in walking, not elsewhere classified: Secondary | ICD-10-CM | POA: Insufficient documentation

## 2016-07-08 DIAGNOSIS — R600 Localized edema: Secondary | ICD-10-CM | POA: Insufficient documentation

## 2016-07-08 DIAGNOSIS — M25571 Pain in right ankle and joints of right foot: Secondary | ICD-10-CM | POA: Insufficient documentation

## 2016-07-08 DIAGNOSIS — M25671 Stiffness of right ankle, not elsewhere classified: Secondary | ICD-10-CM | POA: Insufficient documentation

## 2016-07-09 NOTE — Therapy (Signed)
[facility] [address] [place], [place], [address] Phone: [phone]   Fax:  [phone]  Physical Therapy Evaluation  Patient Details  Name: Michelle Rios MRN: [id number] Date of Birth: [birth year] Referring Provider: Dr Alfredo Martinez   Encounter Date: 07/08/2016      PT End of Session - 07/09/16 0750    Visit Number 1   Number of Visits 16   Date for PT Re-Evaluation 09/03/16   Authorization Type [employer]    PT Start Time 1630   PT Stop Time 1713   PT Time Calculation (min) 43 min   Activity Tolerance Patient tolerated treatment well   Behavior During Therapy [facility] for tasks assessed/performed      Past Medical History:  Diagnosis Date  . AKI (acute kidney injury) (HCC) 09/15/2015  . Diarrhea 09/15/2015  . Hypertension   . Sarcoidosis [facility])     Past Surgical History:  Procedure Laterality Date  . KNEE CARTILAGE SURGERY    . TUBAL LIGATION    . VAGINAL HYSTERECTOMY    . WRIST SURGERY      There were no vitals filed for this visit.       Subjective Assessment - 07/08/16 1602    Subjective Patient was walking and she rolled her ankle. Since that point she has had pain in her ankle. she has had diffiuclty walking. She is currently in a CAM walker boot. She was put in a boot on 06/28/2016. The boot has not helped her pain.    Pertinent History Nothing significant    Limitations Standing;Walking   How long can you sit comfortably? No limit    How long can you stand comfortably? Always in pain when standing    How long can you walk comfortably? Pain with any ambulation but increases with limited household distances    Diagnostic tests X-ray: 2 calcifications though to possibly be chronic    Pain Score 8    Pain Location Foot   Pain Orientation Right  right lateral foot    Pain Descriptors / Indicators Aching   Multiple Pain Sites No            [facility] PT Assessment - 07/09/16 0001      Assessment    Medical Diagnosis right ankle pain   Referring Provider Dr Alfredo Martinez    Onset Date/Surgical Date 05/21/16   Next MD Visit 3 months    Prior Therapy None      Precautions   Precautions None     Restrictions   Weight Bearing Restrictions No     Home Environment   Additional Comments 4 steps to get into the house. 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    Leisure Walking      Cognition   Overall Cognitive Status Within Functional Limits for tasks assessed   Attention Focused   Focused Attention Appears intact   Memory Appears intact   Awareness Appears intact   Problem Solving Appears intact   Executive Function Reasoning     Observation/Other Assessments-Edema    Edema Figure 8     Figure 8 Edema   Figure 8 - Right  67   Figure 8 - Left  58.9     AROM   Right Ankle Dorsiflexion -10   Right Ankle  Plantar Flexion --  10-14     PROM   Right Ankle Dorsiflexion -4                   [facility] Adult PT Treatment/Exercise - 07/09/16 0001      Manual Therapy   Manual therapy comments elevated retrograde massage to the  right ankle to reduce pain, sensetivity, and edema; gentle PROM      Ankle Exercises: Seated   Other Seated Ankle Exercises ankle 4 way x10; df stretch with strap 2x10                 PT Education - 07/08/16 1652    Education provided Yes   Education Details HEP, symptom mangement, improtance of movement.    Person(s) Educated Patient   Methods Explanation;Demonstration   Comprehension Verbalized understanding;Returned demonstration          PT Short Term Goals - 07/09/16 0807      PT SHORT TERM GOAL #1   Title Patient will increase gross left ankle strength to 4/5    Time 4   Period Weeks   Status New     PT SHORT TERM GOAL #2   Title Patient will demsotrate decreased swelling by 3 cm  on the right with figure 8 measurement `   Time 4   Period Weeks   Status New     PT SHORT TERM GOAL #3   Title Patient will report 3/10 pain on the right as worst    Time 4   Period Weeks   Status New     PT SHORT TERM GOAL #4   Title Patient will ambulate 300' w/o increased pain    Time 4   Period Weeks   Status New           PT Long Term Goals - 07/09/16 8032      PT LONG TERM GOAL #1   Title Patient will abualte 3000' w/o pain and without the boot in order to perfrom work tasks.    Time 8   Period Weeks   Status New     PT LONG TERM GOAL #2   Title Patient will bend to pick item off the ground without pain    Time 8   Period Weeks   Status New     PT LONG TERM GOAL #3   Title  Patient will be independent with HEP for ankle strength and stability    Time 8   Period Weeks   Status New     PT LONG TERM GOAL #4   Title Patient will stand for 1 hour without pain    Time 8   Period Weeks   Status New               Plan - 07/09/16 0755    Clinical Impression Statement Patient is a 60 year old female with right lateral ankle pain. Her pain is worse when standing and walking. She is currently in a boot. She has significant swelling around her lateral posterior maleolus. she has maximum tenderness to palpation. Her pain is effecting her ability to walk. She has limited strength, active, and passive ROM in her right ankle. She would benefit from skilled therapy to improve strength and stability.    Rehab Potential Good   PT Frequency 2x / week   PT Duration 8 weeks   PT Treatment/Interventions ADLs/Self Care Home Management;Cryotherapy;Electrical Stimulation;Stair training;Gait training;Ultrasound;Moist Heat;Iontophoresis 4mg /ml Dexamethasone;Functional mobility training;Therapeutic  activities;Therapeutic exercise;Neuromuscular re-education;Patient/family education;Manual techniques;Taping;Dry needling;Passive range of motion   PT Next Visit Plan retrograde  massage for swelling, gentle PROM, ultrasound may help butx-ray not clear if it is a fracure ofr not, weight shifting, patient advised to bring there shoe, toe yoga, seated heel raise `   PT Home Exercise Plan ankle 4 way active movement, PF stretch    Consulted and Agree with Plan of Care Patient      Patient will benefit from skilled therapeutic intervention in order to improve the following deficits and impairments:  Abnormal gait, Decreased activity tolerance, Decreased balance, Decreased strength, Decreased mobility, Increased edema, Decreased endurance, Decreased range of motion, Difficulty walking, Hypomobility, Pain, Obesity  Visit Diagnosis: Pain in right ankle and joints of right foot  Stiffness of right ankle, not elsewhere classified  Localized edema  Difficulty in walking, not elsewhere classified      G-Codes - July 14, 2016 5456    Functional Assessment Tool Used clinical decision making, difficulty standing, difficulty walking and bending    Functional Limitation Mobility: Walking and moving around   Mobility: Walking and Moving Around Current Status (Y5638) At least 60 percent but less than 80 percent impaired, limited or restricted       Problem List Patient Active Problem List   Diagnosis Date Noted  . Right upper quadrant abdominal tenderness   . SOB (shortness of breath)   . Diarrhea 09/15/2015  . AKI (acute kidney injury) (HCC) 09/15/2015    Dessie Coma PT DPT  14-Jul-2016, 8:37 AM  [facility] [address] [place], [place], [address] Phone: [phone]   Fax:  [phone]  Name: Michelle Rios MRN: [id number] Date of Birth: 06-Aug-1955

## 2016-07-12 ENCOUNTER — Ambulatory Visit: Payer: Self-pay | Admitting: Physical Therapy

## 2016-07-12 DIAGNOSIS — M25671 Stiffness of right ankle, not elsewhere classified: Secondary | ICD-10-CM

## 2016-07-12 DIAGNOSIS — R262 Difficulty in walking, not elsewhere classified: Secondary | ICD-10-CM

## 2016-07-12 DIAGNOSIS — R600 Localized edema: Secondary | ICD-10-CM

## 2016-07-12 DIAGNOSIS — M25571 Pain in right ankle and joints of right foot: Secondary | ICD-10-CM

## 2016-07-12 NOTE — Therapy (Signed)
[facility] Outpatient Rehabilitation [facility] [address] [place], [place], [address] Phone: [phone]   Fax:  [phone]  Physical Therapy Treatment  Patient Details  Name: Michelle Rios MRN: [id number] Date of Birth: May 31, 1956 Referring Provider: Dr Alfredo Martinez   Encounter Date: 07/12/2016      PT End of Session - 07/12/16 1325    Visit Number 2   Number of Visits 16   Date for PT Re-Evaluation 09/03/16   Authorization Type [employer]    PT Start Time 0803   PT Stop Time 0845   PT Time Calculation (min) 42 min   Activity Tolerance Patient tolerated treatment well   Behavior During Therapy [facility] for tasks assessed/performed      Past Medical History:  Diagnosis Date  . AKI (acute kidney injury) (HCC) 09/15/2015  . Diarrhea 09/15/2015  . Hypertension   . Sarcoidosis [facility])     Past Surgical History:  Procedure Laterality Date  . KNEE CARTILAGE SURGERY    . TUBAL LIGATION    . VAGINAL HYSTERECTOMY    . WRIST SURGERY      There were no vitals filed for this visit.      Subjective Assessment - 07/12/16 0806    Subjective Patient reports the pain has improved. She is using a surgical shoe today. She feels like she walks better with it. Her pain is only a 2/10 today. She was able to work over the weekend.    Pertinent History Nothing significant    Limitations Standing;Walking   How long can you sit comfortably? No limit    How long can you stand comfortably? Always in pain when standing    How long can you walk comfortably? Pain with any ambulation but increases with limited household distances    Diagnostic tests X-ray: 2 calcifications though to possibly be chronic    Currently in Pain? Yes   Pain Score 2    Pain Location Ankle   Pain Orientation Right   Pain Descriptors / Indicators Aching   Pain Type Acute pain   Pain Onset More than a month ago   Pain Frequency Constant   Aggravating Factors  walking standing   Pain Relieving  Factors rest   Effect of Pain on Daily Activities difficulty perfroming work tasks                          [facility] Adult PT Treatment/Exercise - 07/12/16 0001      Lumbar Exercises: Stretches   Single Knee to Chest Stretch Limitations --     Lumbar Exercises: Seated   LAQ on Chair Limitations 2x10 with right      Lumbar Exercises: Supine   Clam Limitations --   Other Supine Lumbar Exercises --     Manual Therapy   Manual therapy comments elevated retrograde massage to the  right ankle to reduce pain, sensetivity, and edema; gentle PROM      Ankle Exercises: Seated   Other Seated Ankle Exercises ankle 4 way 2x10;  for PF/DF df stretch with strap 2x10; seated heel raise       Ankle Exercises: Standing   Other Standing Ankle Exercises standing weight shift forward 2x10 side to side 2x10 ;                 PT Education - 07/12/16 1325    Education provided Yes   Education Details Improtance of improving movement and strength  Person(s) Educated Patient   Methods Explanation;Demonstration   Comprehension Verbalized understanding;Returned demonstration          PT Short Term Goals - 07/12/16 1329      PT SHORT TERM GOAL #1   Title Patient will increase gross left ankle strength to 4/5    Time 4   Period Weeks   Status On-going     PT SHORT TERM GOAL #2   Title Patient will demsotrate decreased swelling by 3 cm on the right with figure 8 measurement `   Time 4   Period Weeks   Status On-going     PT SHORT TERM GOAL #3   Title Patient will report 3/10 pain on the right as worst    Time 4   Period Weeks   Status On-going     PT SHORT TERM GOAL #4   Title Patient will ambulate 300' w/o increased pain    Time 4   Period Weeks   Status On-going           PT Long Term Goals - 07/09/16 1884      PT LONG TERM GOAL #1   Title Patient will abualte 3000' w/o pain and without the boot in order to perfrom work tasks.    Time 8   Period  Weeks   Status New     PT LONG TERM GOAL #2   Title Patient will bend to pick item off the ground without pain    Time 8   Period Weeks   Status New     PT LONG TERM GOAL #3   Title  Patient will be independent with HEP for ankle strength and stability    Time 8   Period Weeks   Status New     PT LONG TERM GOAL #4   Title Patient will stand for 1 hour without pain    Time 8   Period Weeks   Status New               Plan - 07/12/16 1326    Clinical Impression Statement Patient tolerated treatment well. She reported pain with all movments but her movement improved in all planes. She continues to have swelling behind her maluolus. She had increased pain with talor glides as well. She was given standing weight shifts forward and lateral.    Rehab Potential Good   PT Frequency 2x / week   PT Duration 8 weeks   PT Treatment/Interventions ADLs/Self Care Home Management;Cryotherapy;Electrical Stimulation;Stair training;Gait training;Ultrasound;Moist Heat;Iontophoresis 4mg /ml Dexamethasone;Functional mobility training;Therapeutic activities;Therapeutic exercise;Neuromuscular re-education;Patient/family education;Manual techniques;Taping;Dry needling;Passive range of motion   PT Next Visit Plan retrograde massage for swelling, gentle PROM, ultrasound may help butx-ray not clear if it is a fracure ofr not, weight shifting, patient advised to bring there shoe, toe yoga, seated heel raise `   PT Home Exercise Plan ankle 4 way active movement, PF stretch    Consulted and Agree with Plan of Care Patient      Patient will benefit from skilled therapeutic intervention in order to improve the following deficits and impairments:  Abnormal gait, Decreased activity tolerance, Decreased balance, Decreased strength, Decreased mobility, Increased edema, Decreased endurance, Decreased range of motion, Difficulty walking, Hypomobility, Pain, Obesity  Visit Diagnosis: Pain in right ankle and joints  of right foot  Stiffness of right ankle, not elsewhere classified  Localized edema  Difficulty in walking, not elsewhere classified     Problem List Patient Active Problem List  Diagnosis Date Noted  . Right upper quadrant abdominal tenderness   . SOB (shortness of breath)   . Diarrhea 09/15/2015  . AKI (acute kidney injury) (HCC) 09/15/2015    Dessie Coma  PT DPT  07/12/2016, 2:32 PM  [facility] [address] [place], [place], [address] Phone: [phone]   Fax:  [phone]  Name: ALYSABETH HIRSCH MRN: [id number] Date of Birth: Apr 15, 1956

## 2016-07-16 ENCOUNTER — Ambulatory Visit: Payer: Self-pay | Admitting: Physical Therapy

## 2016-07-28 ENCOUNTER — Ambulatory Visit: Payer: Self-pay | Admitting: Physical Therapy

## 2016-07-29 ENCOUNTER — Ambulatory Visit: Payer: Self-pay | Admitting: Physical Therapy

## 2016-08-05 ENCOUNTER — Ambulatory Visit: Payer: Self-pay | Attending: Student | Admitting: Physical Therapy

## 2016-08-05 DIAGNOSIS — R600 Localized edema: Secondary | ICD-10-CM

## 2016-08-05 DIAGNOSIS — M25671 Stiffness of right ankle, not elsewhere classified: Secondary | ICD-10-CM

## 2016-08-05 DIAGNOSIS — M25571 Pain in right ankle and joints of right foot: Secondary | ICD-10-CM

## 2016-08-05 DIAGNOSIS — R262 Difficulty in walking, not elsewhere classified: Secondary | ICD-10-CM

## 2016-08-05 NOTE — Therapy (Signed)
[facility] Outpatient Rehabilitation [facility] [address] [place], [place], [address] Phone: [phone]   Fax:  [phone]  Physical Therapy Treatment  Patient Details  Name: Michelle Rios MRN: [id number] Date of Birth: Jul 16, 1956 Referring Provider: Dr Alfredo Martinez   Encounter Date: 08/05/2016      PT End of Session - 08/05/16 0847    Visit Number 3   Number of Visits 16   Date for PT Re-Evaluation 09/03/16   Authorization Type [employer]    PT Start Time 0757   PT Stop Time 0838   PT Time Calculation (min) 41 min   Activity Tolerance Patient tolerated treatment well   Behavior During Therapy [facility] for tasks assessed/performed      Past Medical History:  Diagnosis Date  . AKI (acute kidney injury) (HCC) 09/15/2015  . Diarrhea 09/15/2015  . Hypertension   . Sarcoidosis [facility])     Past Surgical History:  Procedure Laterality Date  . KNEE CARTILAGE SURGERY    . TUBAL LIGATION    . VAGINAL HYSTERECTOMY    . WRIST SURGERY      There were no vitals filed for this visit.      Subjective Assessment - 08/05/16 0804    Subjective Patient reports that she has stopped doing her exercises because she feels it is making her ankle swell. She has not been having much pain. She is still using the boot.    Pertinent History Nothing significant    How long can you sit comfortably? No limit    How long can you stand comfortably? Always in pain when standing    How long can you walk comfortably? Pain with any ambulation but increases with limited household distances    Diagnostic tests X-ray: 2 calcifications though to possibly be chronic    Currently in Pain? No/denies            [facility] PT Assessment - 08/05/16 0001      Figure 8 Edema   Figure 8 - Right  65.5    Figure 8 - Left  60     AROM   Right Ankle Dorsiflexion 0     PROM   Overall PROM  Deficits;Other (comment)  inversion 30 degrees, eversion 13 degrees    Right Ankle Dorsiflexion 6                      [facility] Adult PT Treatment/Exercise - 08/05/16 0001      Lumbar Exercises: Seated   LAQ on Chair Limitations 2x10 with right      Ankle Exercises: Seated   Other Seated Ankle Exercises ankle 4 way 2x10;  for PF/DF df stretch with strap 2x10; seated heel raise       Ankle Exercises: Standing   Heel Raises Limitations 2x10   Other Standing Ankle Exercises standing weight shift forward 2x10 side to side 2x10 ;    Other Standing Ankle Exercises standing march 2x10                 PT Education - 08/05/16 [phone]    Education provided Yes   Education Details improtance of continuing strengthening    Person(s) Educated Patient   Methods Explanation;Demonstration   Comprehension Verbalized understanding;Returned demonstration;Verbal cues required          PT Short Term Goals - 07/12/16 1329      PT SHORT TERM GOAL #1   Title Patient will increase gross left ankle strength to  4/5    Time 4   Period Weeks   Status On-going     PT SHORT TERM GOAL #2   Title Patient will demsotrate decreased swelling by 3 cm on the right with figure 8 measurement `   Time 4   Period Weeks   Status On-going     PT SHORT TERM GOAL #3   Title Patient will report 3/10 pain on the right as worst    Time 4   Period Weeks   Status On-going     PT SHORT TERM GOAL #4   Title Patient will ambulate 300' w/o increased pain    Time 4   Period Weeks   Status On-going           PT Long Term Goals - 07/09/16 3419      PT LONG TERM GOAL #1   Title Patient will abualte 3000' w/o pain and without the boot in order to perfrom work tasks.    Time 8   Period Weeks   Status New     PT LONG TERM GOAL #2   Title Patient will bend to pick item off the ground without pain    Time 8   Period Weeks   Status New     PT LONG TERM GOAL #3   Title  Patient will be independent with HEP for ankle strength and stability    Time 8   Period Weeks   Status New     PT LONG  TERM GOAL #4   Title Patient will stand for 1 hour without pain    Time 8   Period Weeks   Status New               Plan - 08/05/16 0904    Clinical Impression Statement Patient is making progress. She has had increased strength, range, and decreased swelling. She is still using the boot. She was advised to obtain an ankle brace. She will get one this week. She was encouraged to continue    Rehab Potential Good   PT Frequency 2x / week   PT Duration 8 weeks   PT Treatment/Interventions ADLs/Self Care Home Management;Cryotherapy;Electrical Stimulation;Stair training;Gait training;Ultrasound;Moist Heat;Iontophoresis 4mg /ml Dexamethasone;Functional mobility training;Therapeutic activities;Therapeutic exercise;Neuromuscular re-education;Patient/family education;Manual techniques;Taping;Dry needling;Passive range of motion   PT Next Visit Plan retrograde massage for swelling, gentle PROM, ultrasound may help butx-ray not clear if it is a fracure ofr not, weight shifting, patient advised to bring there shoe, toe yoga, seated heel raise `   PT Home Exercise Plan ankle 4 way active movement, PF stretch    Consulted and Agree with Plan of Care Patient      Patient will benefit from skilled therapeutic intervention in order to improve the following deficits and impairments:  Abnormal gait, Decreased activity tolerance, Decreased balance, Decreased strength, Decreased mobility, Increased edema, Decreased endurance, Decreased range of motion, Difficulty walking, Hypomobility, Pain, Obesity  Visit Diagnosis: Pain in right ankle and joints of right foot  Stiffness of right ankle, not elsewhere classified  Localized edema  Difficulty in walking, not elsewhere classified     Problem List Patient Active Problem List   Diagnosis Date Noted  . Right upper quadrant abdominal tenderness   . SOB (shortness of breath)   . Diarrhea 09/15/2015  . AKI (acute kidney injury) (HCC) 09/15/2015     09/17/2015  PT 08/05/2016, 12:24 PM  [facility] Outpatient Rehabilitation [facility] [address] [place], [place], [place]  Phone: [phone]   Fax:  [phone]  Name: Michelle Rios MRN: [id number] Date of Birth: 1956-05-01

## 2016-08-09 ENCOUNTER — Ambulatory Visit: Payer: Self-pay | Admitting: Physical Therapy

## 2016-08-09 DIAGNOSIS — M25571 Pain in right ankle and joints of right foot: Secondary | ICD-10-CM

## 2016-08-09 DIAGNOSIS — R600 Localized edema: Secondary | ICD-10-CM

## 2016-08-09 DIAGNOSIS — M25671 Stiffness of right ankle, not elsewhere classified: Secondary | ICD-10-CM

## 2016-08-09 DIAGNOSIS — R262 Difficulty in walking, not elsewhere classified: Secondary | ICD-10-CM

## 2016-08-09 NOTE — Therapy (Signed)
[facility] Outpatient Rehabilitation [facility] [address] [place], [place], [address] Phone: [phone]   Fax:  [phone]  Physical Therapy Treatment  Patient Details  Name: ZETTA STONEMAN MRN: [id number] Date of Birth: 08-08-1955 Referring Provider: Dr Alfredo Martinez   Encounter Date: 08/09/2016      PT End of Session - 08/09/16 0819    Visit Number 4   Number of Visits 16   Date for PT Re-Evaluation 09/03/16   Authorization Type [employer] G-code done on visit 4 30 days. Next G-code on visit 14 or 1 month    PT Start Time 0800   PT Stop Time 0841   PT Time Calculation (min) 41 min   Activity Tolerance Patient tolerated treatment well   Behavior During Therapy [facility] for tasks assessed/performed      Past Medical History:  Diagnosis Date  . AKI (acute kidney injury) (HCC) 09/15/2015  . Diarrhea 09/15/2015  . Hypertension   . Sarcoidosis [facility])     Past Surgical History:  Procedure Laterality Date  . KNEE CARTILAGE SURGERY    . TUBAL LIGATION    . VAGINAL HYSTERECTOMY    . WRIST SURGERY      There were no vitals filed for this visit.      Subjective Assessment - 08/09/16 0818    Subjective Patien could not find ankle brace. She called the doctor to see how long she was supposed to be in the brace. Patinet did not bring sneaker.    Pertinent History Nothing significant    Limitations Standing;Walking   How long can you sit comfortably? No limit    How long can you stand comfortably? Always in pain when standing    How long can you walk comfortably? Pain with any ambulation but increases with limited household distances    Diagnostic tests X-ray: 2 calcifications though to possibly be chronic    Currently in Pain? No/denies                         [facility] Adult PT Treatment/Exercise - 08/09/16 0001      Lumbar Exercises: Seated   LAQ on Chair Limitations 2x10 with right      Manual Therapy   Manual therapy comments elevated  retrograde massage to the  right ankle to reduce pain, sensetivity, and edema; gentle PROM      Ankle Exercises: Seated   Other Seated Ankle Exercises ankle 4 way 2x10 yellow;  for PF/DF df stretch with strap 2x10; seated heel raise       Ankle Exercises: Standing   Heel Raises Limitations 2x10   Other Standing Ankle Exercises standing weight shift forward 2x10 side to side 2x10 ;    Other Standing Ankle Exercises standing march 2x10                 PT Education - 08/09/16 0834    Education provided Yes   Education Details importance of stregthening    Person(s) Educated Patient   Methods Explanation;Demonstration   Comprehension Need further instruction;Verbal cues required          PT Short Term Goals - 08/09/16 0930      PT SHORT TERM GOAL #1   Title Patient will increase gross left ankle strength to 4/5    Baseline continues to work on stretching    Time 4   Period Weeks   Status On-going     PT SHORT TERM GOAL #2  Title Patient will demsotrate decreased swelling by 3 cm on the right with figure 8 measurement `   Time 4   Period Weeks   Status On-going     PT SHORT TERM GOAL #3   Title Patient will report 3/10 pain on the right as worst    Time 4   Period Weeks   Status On-going     PT SHORT TERM GOAL #4   Title Patient will ambulate 300' w/o increased pain    Time 4   Period Weeks   Status On-going           PT Long Term Goals - 07/09/16 1224      PT LONG TERM GOAL #1   Title Patient will abualte 3000' w/o pain and without the boot in order to perfrom work tasks.    Time 8   Period Weeks   Status New     PT LONG TERM GOAL #2   Title Patient will bend to pick item off the ground without pain    Time 8   Period Weeks   Status New     PT LONG TERM GOAL #3   Title  Patient will be independent with HEP for ankle strength and stability    Time 8   Period Weeks   Status New     PT LONG TERM GOAL #4   Title Patient will stand for 1  hour without pain    Time 8   Period Weeks   Status New               Plan - 2016/08/15 4975    Clinical Impression Statement Patients range of motion is improving and her swelling appears to be coming down. She is still skeptical about exercises despite improvements. She reported no pain at the beinging of treatment but she continues to have tenderness to palpation around her lateral inferior maleolus. She was advised  the last few visits to bring a sneaker but has not. She was able to increase her sets with her back exercises.    Rehab Potential Good   PT Frequency 2x / week   PT Duration 8 weeks   PT Treatment/Interventions ADLs/Self Care Home Management;Cryotherapy;Electrical Stimulation;Stair training;Gait training;Ultrasound;Moist Heat;Iontophoresis 4mg /ml Dexamethasone;Functional mobility training;Therapeutic activities;Therapeutic exercise;Neuromuscular re-education;Patient/family education;Manual techniques;Taping;Dry needling;Passive range of motion   PT Next Visit Plan retrograde massage for swelling, gentle PROM, ultrasound may help butx-ray not clear if it is a fracure ofr not, weight shifting, patient advised to bring there shoe, toe yoga, seated heel raise `   PT Home Exercise Plan ankle 4 way active movement, PF stretch    Consulted and Agree with Plan of Care Patient      Patient will benefit from skilled therapeutic intervention in order to improve the following deficits and impairments:  Abnormal gait, Decreased activity tolerance, Decreased balance, Decreased strength, Decreased mobility, Increased edema, Decreased endurance, Decreased range of motion, Difficulty walking, Hypomobility, Pain, Obesity  Visit Diagnosis: Pain in right ankle and joints of right foot  Stiffness of right ankle, not elsewhere classified  Localized edema  Difficulty in walking, not elsewhere classified       G-Codes - August 15, 2016 0934    Functional Assessment Tool Used clinical decision  making, difficulty standing, difficulty walking and bending    Functional Limitation Mobility: Walking and moving around   Mobility: Walking and Moving Around Current Status (P0051) At least 60 percent but less than 80 percent impaired, limited or restricted  Mobility: Walking and Moving Around Goal Status [phone]) At least 1 percent but less than 20 percent impaired, limited or restricted      Problem List Patient Active Problem List   Diagnosis Date Noted  . Right upper quadrant abdominal tenderness   . SOB (shortness of breath)   . Diarrhea 09/15/2015  . AKI (acute kidney injury) (HCC) 09/15/2015    Dessie Coma  PT DPT  08/09/2016, 9:40 AM  [facility] [address] [place], [place], [address] Phone: [phone]   Fax:  [phone]  Name: RAENETTE SAKATA MRN: [id number] Date of Birth: 01/13/1956

## 2016-08-13 ENCOUNTER — Ambulatory Visit: Payer: Self-pay | Admitting: Physical Therapy

## 2016-08-17 ENCOUNTER — Ambulatory Visit: Payer: Self-pay | Admitting: Physical Therapy

## 2016-08-17 DIAGNOSIS — M25571 Pain in right ankle and joints of right foot: Secondary | ICD-10-CM

## 2016-08-17 DIAGNOSIS — R262 Difficulty in walking, not elsewhere classified: Secondary | ICD-10-CM

## 2016-08-17 DIAGNOSIS — R600 Localized edema: Secondary | ICD-10-CM

## 2016-08-17 DIAGNOSIS — M25671 Stiffness of right ankle, not elsewhere classified: Secondary | ICD-10-CM

## 2016-08-18 ENCOUNTER — Encounter: Payer: Self-pay | Admitting: Physical Therapy

## 2016-08-18 NOTE — Therapy (Addendum)
[place], [place], [address] Phone: [phone]   Fax:  [phone]  Physical Therapy Treatment  Patient Details  Name: Michelle Rios MRN: [id number] Date of Birth: 28-Jul-1955 Referring Provider: Dr Mechele Claude   Encounter Date: 08/17/2016      PT End of Session - 08/18/16 1051    Visit Number 5   Number of Visits 16   Date for PT Re-Evaluation 09/03/16   Authorization Type [employer] G-code done on visit 4 30 days. Next G-code on visit 14 or 1 month    PT Start Time 0345   PT Stop Time [phone]   PT Time Calculation (min) 48 min   Activity Tolerance Patient tolerated treatment well   Behavior During Therapy WFL for tasks assessed/performed      Past Medical History:  Diagnosis Date  . AKI (acute kidney injury) ([place]) 09/15/2015  . Diarrhea 09/15/2015  . Hypertension   . Sarcoidosis [facility])     Past Surgical History:  Procedure Laterality Date  . KNEE CARTILAGE SURGERY    . TUBAL LIGATION    . VAGINAL HYSTERECTOMY    . WRIST SURGERY      There were no vitals filed for this visit.      Subjective Assessment - 08/18/16 1043    Subjective Patient spoke with the MD. The MD advised that she begin to ween out of the boot. He recocmeded that she start with a few hous out. Instead the patient has gone without the boot for 6 days straight. Therapy strongly advised the patient to follow MD orders. She reports she does her exercises but still  can not recal her exercises. The patient continues to have only a few exercises at home. She has some swelling but not severe considering she has been out of the boot for days.    Pertinent History Nothing significant    Limitations Standing;Walking   How long can you sit comfortably? No limit    How long can you stand comfortably? Always in pain when standing    How long can you walk comfortably? Pain with any ambulation but increases with limited household distances     Diagnostic tests X-ray: 2 calcifications though to possibly be chronic    Currently in Pain? Yes   Pain Score 8    Pain Location Ankle   Pain Orientation Right   Pain Descriptors / Indicators Aching   Pain Type Acute pain   Pain Onset More than a month ago   Pain Frequency Constant   Aggravating Factors  walking and standing    Pain Relieving Factors rest    Effect of Pain on Daily Activities difficulty perfroming work tasks                          [facility] Adult PT Treatment/Exercise - 08/18/16 0001      Manual Therapy   Manual therapy comments elevated retrograde massage to the  right ankle to reduce pain, sensetivity, and edema; gentle PROM      Ankle Exercises: Seated   Other Seated Ankle Exercises ankle 4 way 2x10 red ev yellow;  for PF/DF df stretch with strap 2x10; seated heel raise       Ankle Exercises: Standing   Other Standing Ankle Exercises standing march 2x10                 PT Education - 08/18/16 1050  Education provided Yes   Education Details significant time spent educating the patient on the improtance of strengthening with questionable understanding.    Person(s) Educated Patient   Methods Explanation;Demonstration;Verbal cues;Tactile cues   Comprehension Need further instruction          PT Short Term Goals - 08/18/16 1347      PT SHORT TERM GOAL #1   Title Patient will increase gross left ankle strength to 4/5    Baseline continues to work on stretching    Time 4   Period Weeks   Status On-going     PT SHORT TERM GOAL #2   Title Patient will demsotrate decreased swelling by 3 cm on the right with figure 8 measurement `   Time 4   Period Weeks   Status On-going     PT SHORT TERM GOAL #3   Title Patient will report 3/10 pain on the right as worst    Time 4   Period Weeks   Status On-going     PT SHORT TERM GOAL #4   Title Patient will ambulate 300' w/o increased pain    Time 4   Period Weeks   Status  On-going           PT Long Term Goals - 07/09/16 8329      PT LONG TERM GOAL #1   Title Patient will abualte 3000' w/o pain and without the boot in order to perfrom work tasks.    Time 8   Period Weeks   Status New     PT LONG TERM GOAL #2   Title Patient will bend to pick item off the ground without pain    Time 8   Period Weeks   Status New     PT LONG TERM GOAL #3   Title  Patient will be independent with HEP for ankle strength and stability    Time 8   Period Weeks   Status New     PT LONG TERM GOAL #4   Title Patient will stand for 1 hour without pain    Time 8   Period Weeks   Status New               Plan - 08/18/16 1324    Clinical Impression Statement Patient was limited today. She continues to have difficult with any type of progression of activity. She reports pain with all exercises even though she is still doing very basic movement exercises. Therapy tried to get her to do some standing weight shifts but she declined 2nd to pain. Therapy spoke with her about returning to the MD if she can not perfrom basic activity. She was advised to listen to the MD when weaning out of the boot. She has gone from wearing the boot 24/7 to being out of the boot.  Her DF is improving.    Rehab Potential Good   PT Frequency 2x / week   PT Duration 8 weeks   PT Treatment/Interventions ADLs/Self Care Home Management;Cryotherapy;Electrical Stimulation;Stair training;Gait training;Ultrasound;Moist Heat;Iontophoresis 71m/ml Dexamethasone;Functional mobility training;Therapeutic activities;Therapeutic exercise;Neuromuscular re-education;Patient/family education;Manual techniques;Taping;Dry needling;Passive range of motion   PT Next Visit Plan retrograde massage for swelling, gentle PROM, ultrasound may help butx-ray not clear if it is a fracure ofr not, weight shifting, patient advised to bring there shoe, toe yoga, seated heel raise `   PT Home Exercise Plan ankle 4 way active  movement, PF stretch    Consulted and Agree with Plan of  Care Patient      Patient will benefit from skilled therapeutic intervention in order to improve the following deficits and impairments:  Abnormal gait, Decreased activity tolerance, Decreased balance, Decreased strength, Decreased mobility, Increased edema, Decreased endurance, Decreased range of motion, Difficulty walking, Hypomobility, Pain, Obesity  Visit Diagnosis: Pain in right ankle and joints of right foot  Stiffness of right ankle, not elsewhere classified  Localized edema  Difficulty in walking, not elsewhere classified    PHYSICAL THERAPY DISCHARGE SUMMARY  Visits from Start of Care:5  Current functional level related to goals / functional outcomes: Continued to have significant pain.    Remaining deficits: Pain with basic activity    Education / Equipment: HEP  Plan: Patient agrees to discharge.  Patient goals were not met. Patient is being discharged due to not returning since the last visit.  ?????     Problem List Patient Active Problem List   Diagnosis Date Noted  . Right upper quadrant abdominal tenderness   . SOB (shortness of breath)   . Diarrhea 09/15/2015  . AKI (acute kidney injury) ([place]) 09/15/2015    Carney Living PT DPT  08/18/2016, 1:52 PM  [facility] [address] [place], [place], [address] Phone: [phone]   Fax:  [phone]  Name: Michelle Rios MRN: [id number] Date of Birth: January 18, 1956

## 2016-08-25 ENCOUNTER — Ambulatory Visit: Payer: Self-pay | Admitting: Physical Therapy

## 2016-08-30 ENCOUNTER — Ambulatory Visit: Payer: Self-pay | Admitting: Physical Therapy

## 2016-09-14 ENCOUNTER — Institutional Professional Consult (permissible substitution): Payer: PRIVATE HEALTH INSURANCE | Admitting: Internal Medicine

## 2017-02-09 ENCOUNTER — Encounter: Payer: Self-pay | Admitting: Family Medicine

## 2017-02-09 ENCOUNTER — Ambulatory Visit (INDEPENDENT_AMBULATORY_CARE_PROVIDER_SITE_OTHER): Payer: BLUE CROSS/BLUE SHIELD | Admitting: Family Medicine

## 2017-02-09 VITALS — BP 118/80 | HR 95 | Temp 98.6°F | Ht 61.0 in | Wt 275.0 lb

## 2017-02-09 DIAGNOSIS — R739 Hyperglycemia, unspecified: Secondary | ICD-10-CM | POA: Diagnosis not present

## 2017-02-09 DIAGNOSIS — Z23 Encounter for immunization: Secondary | ICD-10-CM | POA: Diagnosis not present

## 2017-02-09 DIAGNOSIS — I10 Essential (primary) hypertension: Secondary | ICD-10-CM | POA: Insufficient documentation

## 2017-02-09 DIAGNOSIS — Z114 Encounter for screening for human immunodeficiency virus [HIV]: Secondary | ICD-10-CM | POA: Diagnosis not present

## 2017-02-09 DIAGNOSIS — Z1159 Encounter for screening for other viral diseases: Secondary | ICD-10-CM

## 2017-02-09 DIAGNOSIS — D869 Sarcoidosis, unspecified: Secondary | ICD-10-CM

## 2017-02-09 DIAGNOSIS — D860 Sarcoidosis of lung: Secondary | ICD-10-CM | POA: Insufficient documentation

## 2017-02-09 NOTE — Patient Instructions (Signed)
Michelle Rios, you were seen today to establish care.  Today we discussed your medical problems and I am working on getting you up to date on labs and immunizations.    I put in a referral for colonoscopy and you need to come back for a pap smear at some point.  You also had a tetanus shot today.   It was very nice meeting you,  Take care Michelle Rios L. Myrtie Soman, MD [facility] Family Medicine Resident PGY-2 02/09/2017 4:21 PM

## 2017-02-09 NOTE — Progress Notes (Signed)
    Subjective:  CARRIANNE HYUN is a 61 y.o. female who presents to the [facility] today to establish care  HPI:  Rindi is a 61 year old female here to establish care. She reported that she used to come to our clinic "in the 90s".  She has a past medical history significant for sarcoidosis, essential hypertension and morbid obesity with BMI of 52. Her only complaint today is bilateral knee pain. Pain is nonspecific and she has no associated trauma, locking, swelling or feelings of instability. She reported that she had previously been taking blood pressure medication but had been taken off of it due to improvement of her pressure. Additionally she is to follow up with Dr. Sherene Sires for her sarcoidosis but had been told to only follow up if she becomes symptomatic. He denies any chest pain, shortness of breath, increase in lower extremity swelling or changes in vision.   PMH: Sarcoidosis, morbid obesity Tobacco use: No tobacco history Medication: reviewed and updated ROS: see HPI   Objective:  Physical Exam: BP 118/80   Pulse 95   Temp 98.6 F (37 C) (Oral)   Ht 5\' 1"  (1.549 m)   Wt 275 lb (124.7 kg)   SpO2 96%   BMI 51.96 kg/m   Gen: 61 year old female in NAD, resting comfortably CV: RRR with no murmurs appreciated Pulm: NWOB, CTAB with no crackles, wheezes, or rhonchi GI: Normal bowel sounds present. Soft, Nontender, Nondistended. MSK:  Knee exam: no swelling, mod tenderness at joint lines bilaterally, no instability; ligaments intact, FROM, reduced range of motion, negative drawer sign, collateral ligaments intact Skin: warm, dry Neuro: grossly normal, moves all extremities Psych: Normal affect and thought content  No results found for this or any previous visit (from the past 72 hour(s)).   Assessment/Plan:  Severe obesity (BMI >= 40) (HCC) Discussed effect of weight on her knees and patient stated that she wants to lose weight and plans to begin with healthy diet tomorrow.  Do  not have lipid panel or A1C on file, but would have high suspicion that these would be abnormal.  - plan to follow up in three months to assess lifestyle changes - f/u A1C, CBC, CMP, lipid panel  New patient visit/health maintenance:  Due for multiple health maintenance exams including colonoscopy and pap smear and reported she had mammogram last year. Also due for hepatitis C, HIV and tetanus shot.  - f/u HIV, Hep C and above labs - administered TDAP - GI referral for colonoscopy - pap smear at 70mo follow up  Bilateral osteoarthritis of knees: Mod joint-line tenderness. Reports that she has previously had viscosupplementation injections last year and would like another course.  Discussed effect of weight on her knees and the importance of weight loss.  - follow up in 3 months - consider sports medicine referral for viscosupplementation

## 2017-02-09 NOTE — Assessment & Plan Note (Signed)
Discussed effect of weight on her knees and patient stated that she wants to lose weight and plans to begin with healthy diet tomorrow.  Do not have lipid panel or A1C on file, but would have high suspicion that these would be abnormal.  - plan to follow up in three months to assess lifestyle changes - f/u A1C, CBC, CMP, lipid panel

## 2017-02-10 ENCOUNTER — Other Ambulatory Visit: Payer: Self-pay | Admitting: Family Medicine

## 2017-02-10 LAB — CBC
HEMOGLOBIN: 11.7 g/dL (ref 11.1–15.9)
Hematocrit: 35 % (ref 34.0–46.6)
MCH: 33.1 pg — AB (ref 26.6–33.0)
MCHC: 33.4 g/dL (ref 31.5–35.7)
MCV: 99 fL — ABNORMAL HIGH (ref 79–97)
PLATELETS: 382 10*3/uL — AB (ref 150–379)
RBC: 3.54 x10E6/uL — ABNORMAL LOW (ref 3.77–5.28)
RDW: 12.5 % (ref 12.3–15.4)
WBC: 5.5 10*3/uL (ref 3.4–10.8)

## 2017-02-10 LAB — CMP14+EGFR
A/G RATIO: 1.4 (ref 1.2–2.2)
ALK PHOS: 88 IU/L (ref 39–117)
ALT: 19 IU/L (ref 0–32)
AST: 20 IU/L (ref 0–40)
Albumin: 4.2 g/dL (ref 3.6–4.8)
BILIRUBIN TOTAL: 0.4 mg/dL (ref 0.0–1.2)
BUN/Creatinine Ratio: 14 (ref 12–28)
BUN: 11 mg/dL (ref 8–27)
CHLORIDE: 99 mmol/L (ref 96–106)
CO2: 24 mmol/L (ref 20–29)
Calcium: 9.5 mg/dL (ref 8.7–10.3)
Creatinine, Ser: 0.8 mg/dL (ref 0.57–1.00)
GFR calc Af Amer: 93 mL/min/{1.73_m2} (ref >59)
GFR, EST NON AFRICAN AMERICAN: 80 mL/min/{1.73_m2} (ref >59)
GLOBULIN, TOTAL: 3 g/dL (ref 1.5–4.5)
Glucose: 94 mg/dL (ref 65–99)
POTASSIUM: 4.2 mmol/L (ref 3.5–5.2)
SODIUM: 140 mmol/L (ref 134–144)
Total Protein: 7.2 g/dL (ref 6.0–8.5)

## 2017-02-10 LAB — HEMOGLOBIN A1C
ESTIMATED AVERAGE GLUCOSE: 103 mg/dL
HEMOGLOBIN A1C: 5.2 % (ref 4.8–5.6)

## 2017-02-10 LAB — LIPID PANEL
CHOL/HDL RATIO: 3.1 ratio (ref 0.0–4.4)
CHOLESTEROL TOTAL: 225 mg/dL — AB (ref 100–199)
HDL: 72 mg/dL (ref >39)
LDL CALC: 137 mg/dL — AB (ref 0–99)
TRIGLYCERIDES: 82 mg/dL (ref 0–149)
VLDL CHOLESTEROL CAL: 16 mg/dL (ref 5–40)

## 2017-02-10 LAB — HEPATITIS C ANTIBODY

## 2017-02-10 LAB — HIV ANTIBODY (ROUTINE TESTING W REFLEX): HIV SCREEN 4TH GENERATION: NONREACTIVE

## 2017-02-10 NOTE — Telephone Encounter (Signed)
Pt was seen in clinic yesterday, but forgot to ask for a refill on tramadol for knee pain. 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 ep

## 2017-02-11 MED ORDER — TRAMADOL HCL 50 MG PO TABS: 50.0000 mg | ORAL_TABLET | Freq: Four times a day (QID) | ORAL | 0 refills | Status: DC | PRN

## 2017-04-04 ENCOUNTER — Encounter: Payer: Self-pay | Admitting: Family Medicine

## 2017-09-05 IMAGING — DX DG FOOT COMPLETE 3+V*R*
3 series · 3 of 3 positions shown · non-contrast
Comparison: None.

CLINICAL DATA: Right lateral foot pain for 3 weeks. Trauma 3 weeks
ago.

EXAM:
RIGHT FOOT COMPLETE - 3+ VIEW

[x foot ap right]
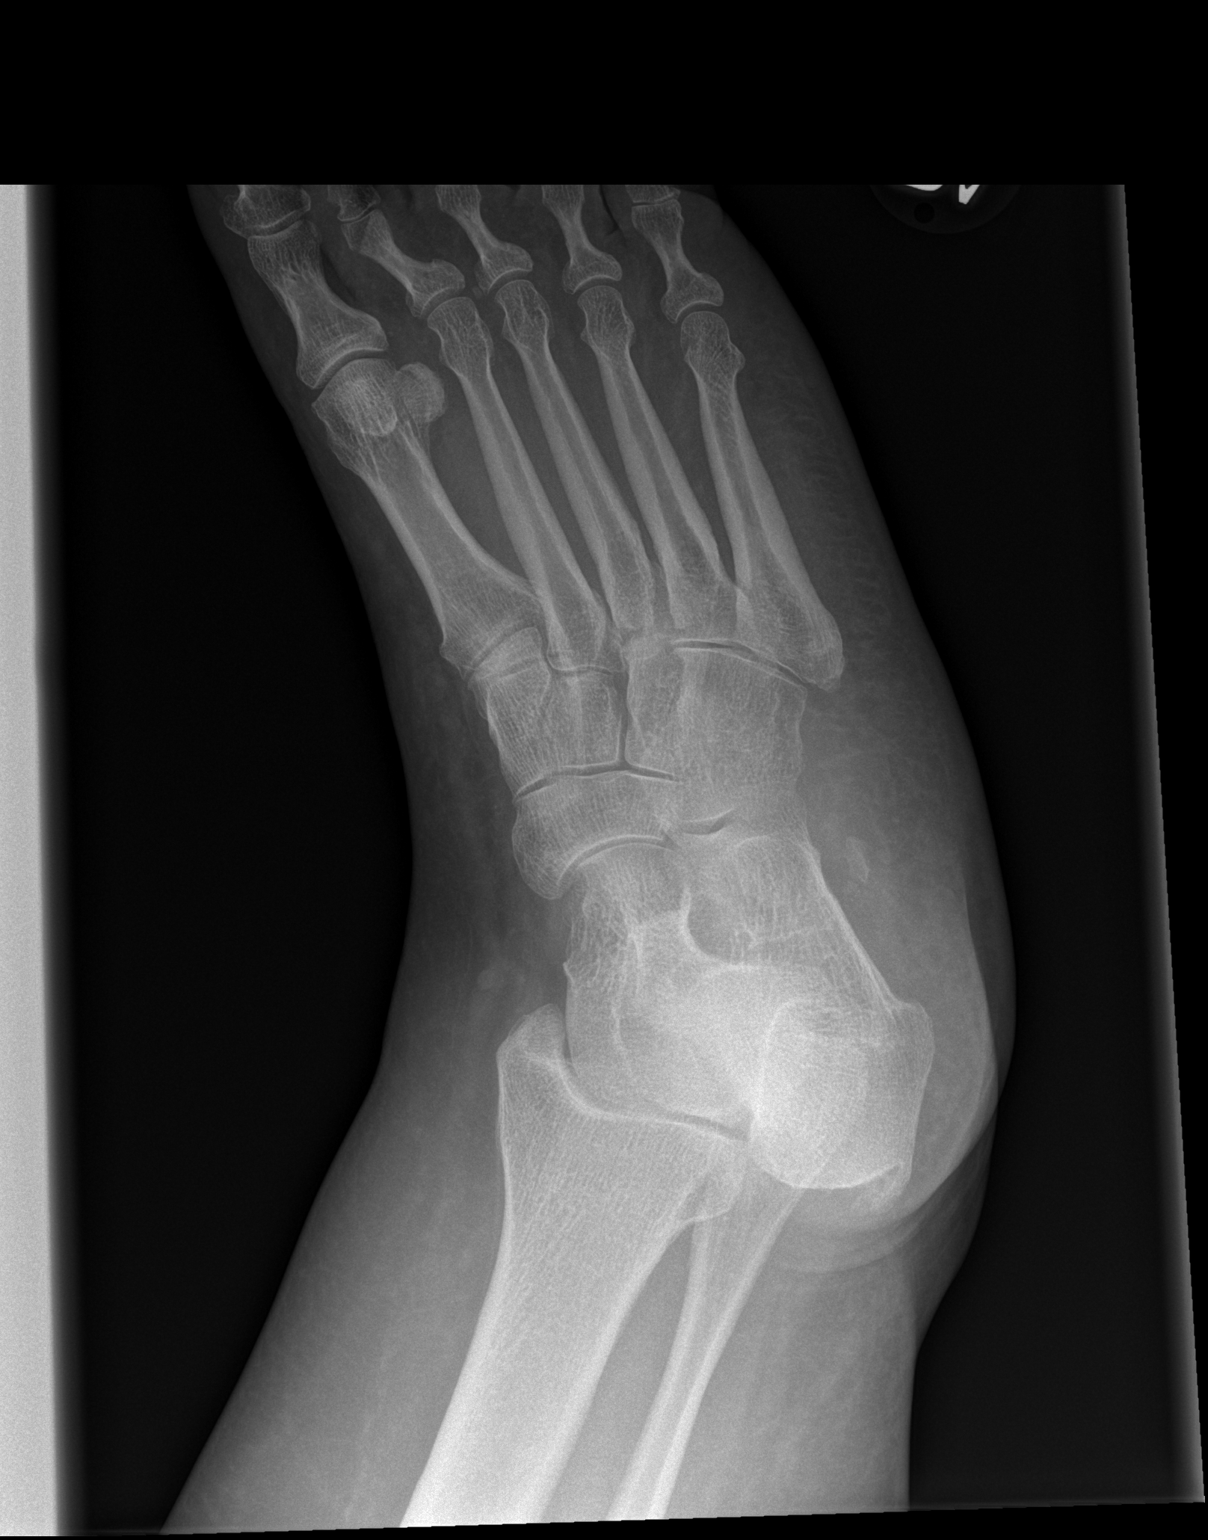

[x foot obl right]
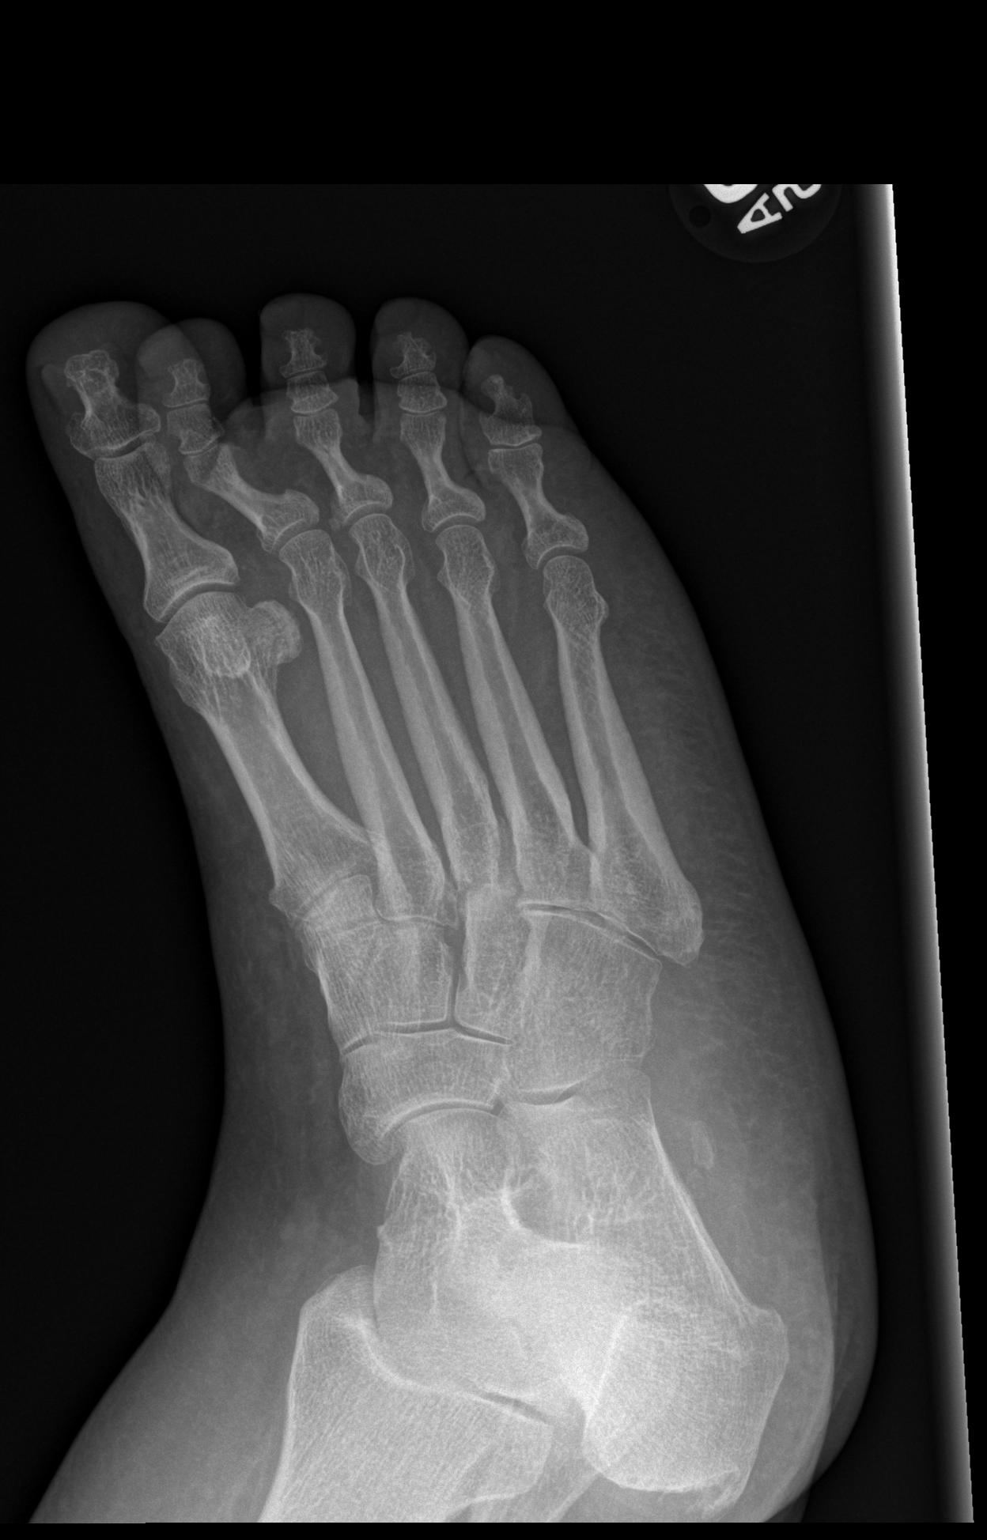

[x foot lat right]
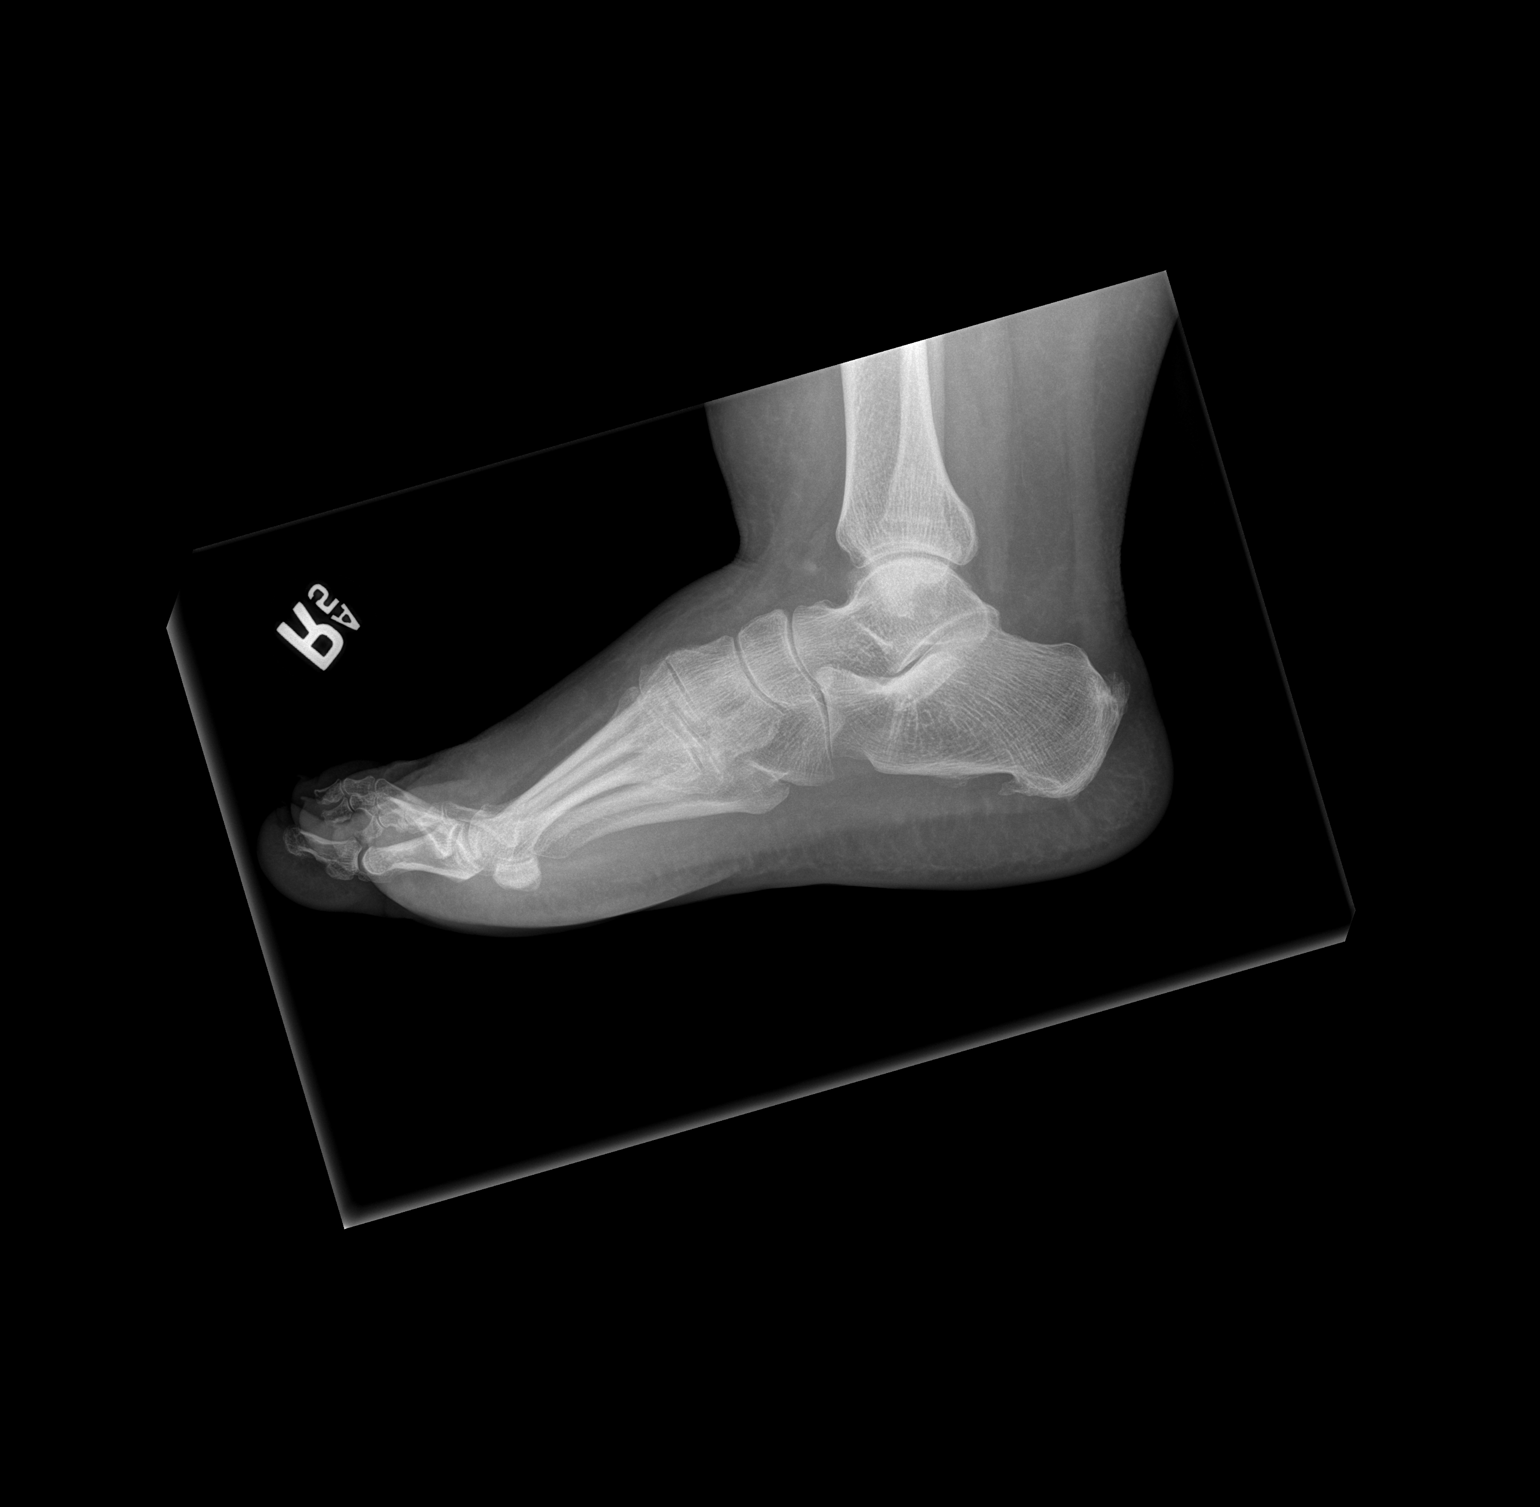

[3 of 3 positions shown; findings below may reference images not displayed]

FINDINGS: Diffuse soft tissue swelling is seen. Two calcifications are located
along the inferior aspect of the mid calcaneus, more posteriorly
located than expected for an os perineum. These are relatively well
corticated and no donor site is identified. These are not thought to
represent acute fracture fragments. Mild irregularity at the base of
the fifth metatarsal is identified without acute fracture, likely a
chronic finding. No other abnormalities.
IMPRESSION: 1. Diffuse soft tissue swelling.
2. Two calcifications along the inferior aspect of the mid calcaneus
are not in a typical location for an accessory ossicle. No donor
site is seen and this finding is not favored to represent acute
fracture fragments. Recommend clinical correlation in this region
for point tenderness. Recommend comparison to outside films if
available to determine chronicity of the finding.

## 2017-09-23 DIAGNOSIS — M25561 Pain in right knee: Secondary | ICD-10-CM | POA: Insufficient documentation

## 2017-11-08 ENCOUNTER — Ambulatory Visit (INDEPENDENT_AMBULATORY_CARE_PROVIDER_SITE_OTHER): Payer: BLUE CROSS/BLUE SHIELD | Admitting: Internal Medicine

## 2017-11-08 ENCOUNTER — Encounter: Payer: Self-pay | Admitting: Internal Medicine

## 2017-11-08 ENCOUNTER — Other Ambulatory Visit: Payer: Self-pay

## 2017-11-08 VITALS — BP 164/92 | HR 90 | Temp 98.5°F | Wt 255.2 lb

## 2017-11-08 DIAGNOSIS — I10 Essential (primary) hypertension: Secondary | ICD-10-CM | POA: Diagnosis not present

## 2017-11-08 DIAGNOSIS — R51 Headache: Secondary | ICD-10-CM | POA: Diagnosis not present

## 2017-11-08 DIAGNOSIS — K5900 Constipation, unspecified: Secondary | ICD-10-CM | POA: Diagnosis not present

## 2017-11-08 DIAGNOSIS — R519 Headache, unspecified: Secondary | ICD-10-CM

## 2017-11-08 MED ORDER — KETOROLAC TROMETHAMINE 30 MG/ML IJ SOLN: 30.0000 mg | Freq: Once | INTRAMUSCULAR | Status: DC

## 2017-11-08 MED ORDER — POLYETHYLENE GLYCOL 3350 17 GM/SCOOP PO POWD: ORAL | 1 refills | Status: DC

## 2017-11-08 MED ORDER — KETOROLAC TROMETHAMINE 30 MG/ML IJ SOLN: 30.0000 mg | Freq: Once | INTRAMUSCULAR | 0 refills | Status: DC

## 2017-11-08 MED ORDER — KETOROLAC TROMETHAMINE 30 MG/ML IJ SOLN
30.0000 mg | Freq: Once | INTRAMUSCULAR | Status: AC
Start: 2017-11-08 — End: 2017-11-08
  Administered 2017-11-08: 30 mg via INTRAMUSCULAR

## 2017-11-08 NOTE — Patient Instructions (Signed)
Ms. Nova,  Please return before the end of the week to recheck blood pressure and make sure your headache has improved.  Please increase water intake--most people need 64 oz (8 cups a day). This likely is contributing to your constipation. Please take miralax daily.   Best, Dr. Sampson Goon

## 2017-11-09 ENCOUNTER — Encounter: Payer: Self-pay | Admitting: Internal Medicine

## 2017-11-09 DIAGNOSIS — K5900 Constipation, unspecified: Secondary | ICD-10-CM | POA: Insufficient documentation

## 2017-11-09 DIAGNOSIS — R519 Headache, unspecified: Secondary | ICD-10-CM | POA: Insufficient documentation

## 2017-11-09 DIAGNOSIS — R51 Headache: Secondary | ICD-10-CM

## 2017-11-09 NOTE — Assessment & Plan Note (Signed)
-   Initial symptoms sound like tension-type headache but also with elements of migraine with light and sound sensitivity. Nasal congestion may also be contributing.  - Recommended treatment with NSAIDs prn. Patient preferred getting toradol shot today. SCr 0.8 in July 2018.  - Upon follow-up reassess degree of congestion; consider nasal steroid vs antihistamine.  - Monitor BP closely, as was elevated today.  - Recommended increasing fluid intake

## 2017-11-09 NOTE — Assessment & Plan Note (Signed)
-   BP above goal of < 140/90. History of HTN but taken off medication therapy in the past.  - Recheck in a few days with likely need to initiate therapy. Would likely start with CCB or hctz.

## 2017-11-09 NOTE — Assessment & Plan Note (Signed)
-   Possibly from diet change increasing fiber intake without increase in water intake. Still passing flatus so do not think she has bowel obstruction and is nontender on abdominal exam. - Recommended increasing fluid intake to 6-8 cups a day - Ordered miralax, with advice to titrate up as needed to have 1 soft BM daily

## 2017-11-09 NOTE — Progress Notes (Signed)
[facility] Progress Note  Subjective:  Michelle Rios is a 62 y.o. female with history of sarcoidosis, HTN, and obesity who presents for complaint of headache x 1 day and constipation x 1 month.  #Headache: - Began yesterday. She did not wake up with it but noticed during the day. It started at her temples then seemed to move all over. It has not gone away entirely but is less severe.  - Worsened by noise and light.  - Denies acute vision changes but reports a couple episodes of double vision with positional changes (getting up from sitting to standing) - Has not taken anything for it because she was afraid she would "overdose" and hurt her liver or kidneys - Denies weakness or emesis  #Constipation: - Has only had a couple BMs over the last month.  - She changed her diet about 1 month ago to limit meat and to consist primarily of fruits and vegetables. She does have fish a couple times a week and uses blue cheese dressing. - She has tried prune juice with no relief. Only milk of magnesia has worked. - Drinks very little water or other fluids because she is afraid she will retain fluid.  - Thinks she has lost 20 lbs this month but did not weigh herself before starting diet.  - She continues to pass gas.   #HTN: - Reports being taken off medication due to good BP readings   Allergies  Allergen Reactions  . Ivp Dye [Iodinated Diagnostic Agents] Anaphylaxis and Swelling    Social History   Tobacco Use  . Smoking status: Never Smoker  . Smokeless tobacco: Never Used  Substance Use Topics  . Alcohol use: No    Objective: Blood pressure (!) 164/92, pulse 90, temperature 98.5 F (36.9 C), temperature source Oral, weight 255 lb 3.2 oz (115.8 kg), SpO2 97 %. Body mass index is 48.22 kg/m. Constitutional: Pleasant, obese female in NAD HENT: MMM, mild nasal congestion, mild fluid behind bilateral TMs Cardiovascular: RRR, S1, S2, no m/r/g.  Pulmonary/Chest:  Effort normal and breath sounds normal.  Abdominal: Soft. +BS, NT Musculoskeletal: No LE edema Neurological: AOx3, no focal deficits. Skin: Skin is warm and dry. No rash noted.  Psychiatric: Somewhat anxious affect Vitals reviewed  Assessment/Plan: Nonintractable headache - Initial symptoms sound like tension-type headache but also with elements of migraine with light and sound sensitivity. Nasal congestion may also be contributing.  - Recommended treatment with NSAIDs prn. Patient preferred getting toradol shot today. SCr 0.8 in July 2018.  - Upon follow-up reassess degree of congestion; consider nasal steroid vs antihistamine.  - Monitor BP closely, as was elevated today.  - Recommended increasing fluid intake  Constipation - Possibly from diet change increasing fiber intake without increase in water intake. Still passing flatus so do not think she has bowel obstruction and is nontender on abdominal exam. - Recommended increasing fluid intake to 6-8 cups a day - Ordered miralax, with advice to titrate up as needed to have 1 soft BM daily  Essential hypertension - BP above goal of < 140/90. History of HTN but taken off medication therapy in the past.  - Recheck in a few days with likely need to initiate therapy. Would likely start with CCB or hctz.   Follow-up in a couple days for BP recheck and to make sure headache has improved.   Dani Gobble, MD [facility], PGY-3

## 2017-11-10 ENCOUNTER — Ambulatory Visit (INDEPENDENT_AMBULATORY_CARE_PROVIDER_SITE_OTHER): Payer: BLUE CROSS/BLUE SHIELD | Admitting: Internal Medicine

## 2017-11-10 ENCOUNTER — Other Ambulatory Visit: Payer: Self-pay

## 2017-11-10 ENCOUNTER — Encounter: Payer: Self-pay | Admitting: Licensed Clinical Social Worker

## 2017-11-10 VITALS — BP 142/96 | HR 89 | Temp 98.3°F | Ht 61.0 in | Wt 256.4 lb

## 2017-11-10 DIAGNOSIS — F430 Acute stress reaction: Secondary | ICD-10-CM | POA: Diagnosis not present

## 2017-11-10 DIAGNOSIS — I10 Essential (primary) hypertension: Secondary | ICD-10-CM | POA: Diagnosis not present

## 2017-11-10 MED ORDER — HYDROCHLOROTHIAZIDE 12.5 MG PO TABS: 12.5000 mg | ORAL_TABLET | Freq: Every day | ORAL | 3 refills | Status: DC

## 2017-11-10 NOTE — Patient Instructions (Signed)
Please follow-up in about 2 weeks to discuss your blood pressure.  if your blood pressure still elevated in the next two weeks we will want you to restart medications.

## 2017-11-10 NOTE — Addendum Note (Signed)
Addended by: Noralee Chars Z on: 11/10/2017 10:14 AM   Modules accepted: Orders

## 2017-11-10 NOTE — Progress Notes (Signed)
Type of Service: Integrated Behavioral Health Warm Handoff  Michelle Rios is a 62 y.o. female referred by Dr. Cathlean Cower for assistance with managing an acute stressor. 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    Patient was tearful and very talkative as she shared and repeated several times the situation with her patient.  Issues discussed:  Integrated care services,, previous and current coping skills,, things patient enjoy or use to enjoy doing, demonstration of relaxed breathing and replacing her negative thoughts.    Intervention: Reflective listening, supportive counseling, relaxed breathing Problem-solving teaching/coping strategies and Psychoeducation ; Brief CBT Assessment/Plan:.Patient experiencing stress from work and is having difficulty with moving past the situation due to not feeling like she can trust her boss.  She may benefit from brief therapeutic interventions to assist with managing stress. Patient did not commit to F/U appointment with [facility].  She did agree to 1. Do Relaxed breathing  2. Do something fun this weekend (self-care) 3. Replace her negative thought of the situation with a positive thought we Identified today  Sammuel Hines, LCSW Licensed Clinical Social Worker [facility]   [phone] 11:52 AM

## 2017-11-10 NOTE — Progress Notes (Signed)
   [facility] Noralee Chars, MD Phone: [phone]  Reason For Visit: Follow for blood pressure   # Elevated Blood pressure  -Blood pressure still elevated today however improved from previous visit 2 days ago.  Patient is still very upset about her work.  She is tearful during the exam and spent about 15 minutes talking to me about issues she has been having at work.  She does not think her blood pressure would be normally elevated.  She still feels like she is very upset and does not want to currently try medication for her blood pressure.  2 days ago she did have a severe headache however that has completely subsided after receiving the Toradol shot.  She also noted having some constipation previously and that has completely resolved she is now doing much better.  Past Medical History Reviewed problem list.  Medications- reviewed and updated No additions to family history Social history- patient is a non- smoker  Objective: BP (!) 142/96   Pulse 89   Temp 98.3 F (36.8 C) (Oral)   Ht 5\' 1"  (1.549 m)   Wt 256 lb 6.4 oz (116.3 kg)   SpO2 94%   BMI 48.45 kg/m  Gen: NAD, alert, cooperative with exam Cardio: regular rate and rhythm, S1S2 heard, no murmurs appreciated Pulm: clear to auscultation bilaterally, no wheezes, rhonchi or rales MSK: Normal gait and station   Assessment/Plan: See problem based a/p  Essential hypertension Blood pressure 142/96 improved from previous visit.  Patient is not interested in starting a medication right now as she believes her blood pressure is elevated due to being upset. -Discussed weight loss and salt intake to help improve blood pressure -Follow-up in 2 weeks to recheck and consider medication at that time if not significantly improved  Acute reaction to stress Patient with an event that occurred at her work which is very distressing to her.  Majority of our visit was spent discussing this issue.  Patient was tearful  during examination. -Had patient follow-up with to have further behavioral counseling and behavioral to techniques to deal with her stress

## 2017-11-12 DIAGNOSIS — F430 Acute stress reaction: Secondary | ICD-10-CM | POA: Insufficient documentation

## 2017-11-12 NOTE — Assessment & Plan Note (Signed)
Blood pressure 142/96 improved from previous visit.  Patient is not interested in starting a medication right now as she believes her blood pressure is elevated due to being upset. -Discussed weight loss and salt intake to help improve blood pressure -Follow-up in 2 weeks to recheck and consider medication at that time if not significantly improved

## 2017-11-12 NOTE — Assessment & Plan Note (Signed)
Patient with an event that occurred at her work which is very distressing to her.  Majority of our visit was spent discussing this issue.  Patient was tearful during examination. -Had patient follow-up with Sammuel Hines to have further behavioral counseling and behavioral to techniques to deal with her stress

## 2017-12-01 ENCOUNTER — Ambulatory Visit (INDEPENDENT_AMBULATORY_CARE_PROVIDER_SITE_OTHER): Payer: BLUE CROSS/BLUE SHIELD | Admitting: Family Medicine

## 2017-12-01 ENCOUNTER — Other Ambulatory Visit: Payer: Self-pay

## 2017-12-01 ENCOUNTER — Encounter: Payer: Self-pay | Admitting: Family Medicine

## 2017-12-01 VITALS — BP 146/78 | HR 75 | Temp 98.7°F | Ht 61.0 in | Wt 257.0 lb

## 2017-12-01 DIAGNOSIS — I10 Essential (primary) hypertension: Secondary | ICD-10-CM | POA: Diagnosis not present

## 2017-12-01 DIAGNOSIS — D649 Anemia, unspecified: Secondary | ICD-10-CM

## 2017-12-01 DIAGNOSIS — E782 Mixed hyperlipidemia: Secondary | ICD-10-CM | POA: Diagnosis not present

## 2017-12-01 MED ORDER — INDAPAMIDE 1.25 MG PO TABS: 1.2500 mg | ORAL_TABLET | Freq: Every day | ORAL | 2 refills | Status: DC

## 2017-12-01 NOTE — Patient Instructions (Signed)
It was a pleasure to see you today! Thank you for choosing [facility] for your primary care. Michelle Rios was seen for blood pressure.   Our plans for today were:  Start the new blood pressure medicine. Call if you have lightheadedness or concerns.   Come back in 1 month to recheck.    Best,  Dr. Chanetta Marshall

## 2017-12-01 NOTE — Progress Notes (Signed)
   CC: follow up blood pressure  HPI   HTN - "Stress had her pressure up." Feels she is retaining fluids. Was drinking more water to get her bowels going, and feels this is increasing her weight. She was on HCTZ before. Stopped this in 2012. She thinks she stopped this herself rather than MD stopping it. Does have occasional SOB when walking long distances, but no PND or orthopnea. No hx of CAD or CHF that she knows of.   Gets pap at [facility]  Dr Cristina Gong for colonoscopy - states she is UTD  ROS: Denies CP, + mild occasional SOB, denies abdominal pain, dysuria, changes in BMs.   CC, SH/smoking status, and VS noted  Objective: BP (!) 146/78   Pulse 75   Temp 98.7 F (37.1 C) (Oral)   Ht '5\' 1"'$  (1.549 m)   Wt 257 lb (116.6 kg)   SpO2 98%   BMI 48.56 kg/m  Gen: NAD, alert, cooperative, and pleasant obese female.  HEENT: NCAT, EOMI, PERRL CV: RRR, no murmur Resp: CTAB, no wheezes, non-labored Ext: No edema, warm  Neuro: Alert and oriented, Speech clear, No gross deficits  Assessment and plan:  Essential hypertension Patient wants something different than HCTZ. Will start low dose indapamide, return in 1 month to recheck and up titrate as needed.    Orders Placed This Encounter  Procedures  . CBC  . CMP14+EGFR  . Lipid panel  . CBC    Standing Status:   Future    Standing Expiration Date:   12/03/2018  . Basic metabolic panel    Standing Status:   Future    Standing Expiration Date:   12/03/2018  . Iron and TIBC    Standing Status:   Future    Standing Expiration Date:   12/03/2018    Meds ordered this encounter  Medications  . indapamide (LOZOL) 1.25 MG tablet    Sig: Take 1 tablet (1.25 mg total) by mouth daily.    Dispense:  30 tablet    Refill:  2    Health Maintenance reviewed - ROI completed for [facility] for colonoscopy results.   Ralene Ok, MD, PGY2 12/02/2017 9:23 AM   12/02/17 Called patient to let her know labs were normal with the  exception of mild anemia. Will order f/u CBC, BMP, and iron panel in 10 days to recheck K after starting diuretic and check etiology of anemia. No answer, left VM explaining above.

## 2017-12-02 LAB — CMP14+EGFR
A/G RATIO: 1.4 (ref 1.2–2.2)
ALT: 15 IU/L (ref 0–32)
AST: 16 IU/L (ref 0–40)
Albumin: 3.7 g/dL (ref 3.6–4.8)
Alkaline Phosphatase: 70 IU/L (ref 39–117)
BUN/Creatinine Ratio: 14 (ref 12–28)
BUN: 11 mg/dL (ref 8–27)
Bilirubin Total: 0.3 mg/dL (ref 0.0–1.2)
CALCIUM: 9 mg/dL (ref 8.7–10.3)
CO2: 27 mmol/L (ref 20–29)
CREATININE: 0.81 mg/dL (ref 0.57–1.00)
Chloride: 103 mmol/L (ref 96–106)
GFR calc Af Amer: 91 mL/min/{1.73_m2} (ref >59)
GFR calc non Af Amer: 79 mL/min/{1.73_m2} (ref >59)
GLOBULIN, TOTAL: 2.7 g/dL (ref 1.5–4.5)
Glucose: 86 mg/dL (ref 65–99)
POTASSIUM: 4.1 mmol/L (ref 3.5–5.2)
Sodium: 143 mmol/L (ref 134–144)
TOTAL PROTEIN: 6.4 g/dL (ref 6.0–8.5)

## 2017-12-02 LAB — CBC
HEMATOCRIT: 32.5 % — AB (ref 34.0–46.6)
Hemoglobin: 10.8 g/dL — ABNORMAL LOW (ref 11.1–15.9)
MCH: 32.6 pg (ref 26.6–33.0)
MCHC: 33.2 g/dL (ref 31.5–35.7)
MCV: 98 fL — AB (ref 79–97)
PLATELETS: 319 10*3/uL (ref 150–379)
RBC: 3.31 x10E6/uL — AB (ref 3.77–5.28)
RDW: 13.4 % (ref 12.3–15.4)
WBC: 5.1 10*3/uL (ref 3.4–10.8)

## 2017-12-02 LAB — LIPID PANEL
CHOL/HDL RATIO: 2.9 ratio (ref 0.0–4.4)
Cholesterol, Total: 177 mg/dL (ref 100–199)
HDL: 62 mg/dL (ref >39)
LDL CALC: 103 mg/dL — AB (ref 0–99)
Triglycerides: 58 mg/dL (ref 0–149)
VLDL Cholesterol Cal: 12 mg/dL (ref 5–40)

## 2017-12-02 NOTE — Assessment & Plan Note (Signed)
Patient wants something different than HCTZ. Will start low dose indapamide, return in 1 month to recheck and up titrate as needed.

## 2017-12-06 ENCOUNTER — Encounter: Payer: Self-pay | Admitting: Family Medicine

## 2017-12-06 DIAGNOSIS — D638 Anemia in other chronic diseases classified elsewhere: Secondary | ICD-10-CM | POA: Insufficient documentation

## 2017-12-06 DIAGNOSIS — D649 Anemia, unspecified: Secondary | ICD-10-CM | POA: Insufficient documentation

## 2017-12-06 DIAGNOSIS — E785 Hyperlipidemia, unspecified: Secondary | ICD-10-CM | POA: Insufficient documentation

## 2017-12-13 ENCOUNTER — Ambulatory Visit (INDEPENDENT_AMBULATORY_CARE_PROVIDER_SITE_OTHER): Payer: BLUE CROSS/BLUE SHIELD | Admitting: Family Medicine

## 2017-12-13 ENCOUNTER — Other Ambulatory Visit: Payer: BLUE CROSS/BLUE SHIELD

## 2017-12-13 DIAGNOSIS — I10 Essential (primary) hypertension: Secondary | ICD-10-CM | POA: Diagnosis not present

## 2017-12-13 DIAGNOSIS — D649 Anemia, unspecified: Secondary | ICD-10-CM

## 2017-12-13 NOTE — Progress Notes (Signed)
Patient here today for BP check.    BP today is 130/84.  Checked BP in Left arm.  Symptoms present: none.  Patient last took BP med last night at 7:30 pm.  Routed note to PCP.    Shawna Orleans, RN

## 2017-12-14 ENCOUNTER — Other Ambulatory Visit: Payer: Self-pay

## 2017-12-14 ENCOUNTER — Telehealth: Payer: Self-pay | Admitting: *Deleted

## 2017-12-14 LAB — IRON AND TIBC
IRON SATURATION: 18 % (ref 15–55)
Iron: 53 ug/dL (ref 27–139)
TIBC: 287 ug/dL (ref 250–450)
UIBC: 234 ug/dL (ref 118–369)

## 2017-12-14 LAB — BASIC METABOLIC PANEL
BUN / CREAT RATIO: 16 (ref 12–28)
BUN: 14 mg/dL (ref 8–27)
CHLORIDE: 98 mmol/L (ref 96–106)
CO2: 24 mmol/L (ref 20–29)
Calcium: 9.6 mg/dL (ref 8.7–10.3)
Creatinine, Ser: 0.88 mg/dL (ref 0.57–1.00)
GFR calc Af Amer: 82 mL/min/{1.73_m2} (ref >59)
GFR calc non Af Amer: 71 mL/min/{1.73_m2} (ref >59)
GLUCOSE: 105 mg/dL — AB (ref 65–99)
Potassium: 4.2 mmol/L (ref 3.5–5.2)
SODIUM: 141 mmol/L (ref 134–144)

## 2017-12-14 LAB — CBC
HEMATOCRIT: 36.9 % (ref 34.0–46.6)
Hemoglobin: 12.6 g/dL (ref 11.1–15.9)
MCH: 33.4 pg — ABNORMAL HIGH (ref 26.6–33.0)
MCHC: 34.1 g/dL (ref 31.5–35.7)
MCV: 98 fL — ABNORMAL HIGH (ref 79–97)
PLATELETS: 406 10*3/uL (ref 150–450)
RBC: 3.77 x10E6/uL (ref 3.77–5.28)
RDW: 13.2 % (ref 12.3–15.4)
WBC: 5.8 10*3/uL (ref 3.4–10.8)

## 2017-12-14 NOTE — Telephone Encounter (Signed)
LVM for pt to call office back to inform her of below. Zimmerman Rumple, Tametha Banning D, CMA  

## 2017-12-14 NOTE — Telephone Encounter (Signed)
-----   Message from Shawna Orleans, RN sent at 12/14/2017 10:46 AM EDT ----- Please call pt with PCP instructions

## 2017-12-14 NOTE — Progress Notes (Signed)
Thanks, RN team. Please call patient and ask her to increase her indapamide to 2.5mg  from 1.25mg  daily, and keep her follow up with me on 6/10. Her labs look great from yesterday, you are welcome to tell her that.

## 2017-12-15 NOTE — Telephone Encounter (Signed)
PCP instructions  Thanks, RN team. Please call patient and ask her to increase her indapamide to 2.5mg  from 1.25mg  daily, and keep her follow up with me on 6/10. Her labs look great from yesterday, you are welcome to tell her that.

## 2017-12-16 NOTE — Telephone Encounter (Signed)
LMOVM for pt to return call. Jazel Nimmons Dawn, CMA  

## 2017-12-20 NOTE — Telephone Encounter (Signed)
Called pt.  She is confused on what med needs to be increased.  I spelled the medication several times and asked her to look for the one that is 1.25mg .  She is insistent that she does not have a medication by that name and request to "come up here tomorrow with my meds and someone can show me".  Attempted again to help pt identify medication, but no progress.  Appt made for nurse clinic tomorrow @ 10am. Michelle Rios, Michelle Rios, CMA

## 2017-12-21 ENCOUNTER — Ambulatory Visit: Payer: BLUE CROSS/BLUE SHIELD

## 2017-12-22 NOTE — Assessment & Plan Note (Signed)
Improving.

## 2018-01-02 ENCOUNTER — Ambulatory Visit (INDEPENDENT_AMBULATORY_CARE_PROVIDER_SITE_OTHER): Payer: BLUE CROSS/BLUE SHIELD | Admitting: Family Medicine

## 2018-01-02 ENCOUNTER — Encounter: Payer: Self-pay | Admitting: Family Medicine

## 2018-01-02 ENCOUNTER — Other Ambulatory Visit: Payer: Self-pay

## 2018-01-02 VITALS — BP 144/88 | HR 105 | Temp 98.9°F | Ht 61.0 in | Wt 256.6 lb

## 2018-01-02 DIAGNOSIS — I10 Essential (primary) hypertension: Secondary | ICD-10-CM

## 2018-01-02 MED ORDER — INDAPAMIDE 2.5 MG PO TABS: 2.5000 mg | ORAL_TABLET | Freq: Every day | ORAL | 1 refills | Status: DC

## 2018-01-02 NOTE — Assessment & Plan Note (Signed)
Patient would like to work on weight loss, gave info to call [facility].

## 2018-01-02 NOTE — Progress Notes (Signed)
   CC: HTN  HPI  Feeling well, missed lozol x 2 days as she ran out 2/2 taking increased dose. She didn't call because she thought the pharmacy would refill as take 2 tabs at once and she wanted the increased dose. No vision changes, speech changes. Took BP at home, average 120-130/80s. Gets SOB Walking in today. Can walk from here to short stay building without SOB. No change in SOB, thinks she had an echo back when [facility] was [place] cards with maybe Dr. Allyson Sabal.   Bilateral knee pain from OA, going to [facility] ortho. Has been getting synvisc shots in his knees. Feels pain is keeping her BP up.   Weight - has tried salads, [employer]. Has not been consistent with these, no real exercise lately. Wants to lose weight, feels it would help knee pain.  ROS: Denies CP, denies change in SOB, abdominal pain, dysuria, changes in BMs.   CC, SH/smoking status, and VS noted  Objective: BP (!) 144/88   Pulse (!) 105   Temp 98.9 F (37.2 C) (Oral)   Ht 5\' 1"  (1.549 m)   Wt 256 lb 9.6 oz (116.4 kg)   SpO2 97%   BMI 48.48 kg/m  Gen: NAD, alert, cooperative, and pleasant. HEENT: NCAT, EOMI, PERRL CV: RRR, no murmur Resp: CTAB, no wheezes, non-labored Ext: No pitting edema, warm Neuro: Alert and oriented, Speech clear, No gross deficits  Assessment and plan:  Essential hypertension Continue increased dose of indapamide, recheck at home after getting meds again. Patient to call if BP not 130/80 or less, otherwise see in 3 months.   Severe obesity (BMI >= 40) (HCC) Patient would like to work on weight loss, gave info to call [facility].    No orders of the defined types were placed in this encounter.   Meds ordered this encounter  Medications  . indapamide (LOZOL) 2.5 MG tablet    Sig: Take 1 tablet (2.5 mg total) by mouth daily.    Dispense:  90 tablet    Refill:  1    , MD, PGY2 01/02/2018 2:37 PM

## 2018-01-02 NOTE — Patient Instructions (Signed)
It was a pleasure to see you today! Thank you for choosing [facility] for your primary care. Mack Guise was seen for HTN.   Our plans for today were:  I sent a new prescription for your blood pressure medicine.   For the weight loss clinic, you need to call and go to a class before they will start the program with you.   You should return to our clinic to see Dr. Chanetta Marshall in 3 months for  Blood pressure.   Best,  Dr. Chanetta Marshall

## 2018-01-02 NOTE — Assessment & Plan Note (Signed)
Continue increased dose of indapamide, recheck at home after getting meds again. Patient to call if BP not 130/80 or less, otherwise see in 3 months.

## 2018-02-01 ENCOUNTER — Encounter: Payer: Self-pay | Admitting: Family Medicine

## 2018-02-01 ENCOUNTER — Ambulatory Visit (INDEPENDENT_AMBULATORY_CARE_PROVIDER_SITE_OTHER): Payer: BLUE CROSS/BLUE SHIELD | Admitting: Family Medicine

## 2018-02-01 ENCOUNTER — Other Ambulatory Visit: Payer: Self-pay

## 2018-02-01 VITALS — BP 160/100 | HR 107 | Temp 98.6°F | Wt 257.6 lb

## 2018-02-01 DIAGNOSIS — R21 Rash and other nonspecific skin eruption: Secondary | ICD-10-CM | POA: Insufficient documentation

## 2018-02-01 DIAGNOSIS — D869 Sarcoidosis, unspecified: Secondary | ICD-10-CM | POA: Diagnosis not present

## 2018-02-01 DIAGNOSIS — R61 Generalized hyperhidrosis: Secondary | ICD-10-CM | POA: Diagnosis not present

## 2018-02-01 NOTE — Assessment & Plan Note (Addendum)
Acute.  Uncertain etiology.  Given history of sarcoidosis, could be cutaneous manifestation though biopsy would be required.  Differential includes contact dermatitis or eczema.  Unlikely related to indapamide use given chronic tolerance. - Recommended over-the-counter hydrocortisone - Ambulatory referral to pulmonology for sarcoidosis follow-up

## 2018-02-01 NOTE — Patient Instructions (Signed)
Thank you for coming in to see [place] today. Please see below to review our plan for today's visit.  1.  I will call you when I get the results of your blood work and chest x-ray. 2.  Apply over-the-counter hydrocortisone cream over your rash.  We will need to monitor this over the next month.  This may be related to a sarcoidosis flare.  I have placed a referral for you to return to the pulmonologist.  They should contact you to schedule the appointment. 3.  Restart your indapamide as prescribed.  I do not believe this is related to your night sweats or your rash. 4.  If you develop any chest pain or significant worsening of your breathing, call 911 immediately.  Please call the clinic at [phone] if your symptoms worsen or you have any concerns. It was our pleasure to serve you.  Durward Parcel, DO [facility], PGY-3

## 2018-02-01 NOTE — Progress Notes (Signed)
Subjective   Patient ID: Michelle Rios    DOB: 1956-02-23, 62 y.o. female   MRN: [id number]  CC: "Sweating and rash"  HPI: Michelle Rios is a 62 y.o. female who presents for a same day appointment for the following:  Night sweats: Patient has been experiencing night sweats every other day for the past month.  This began after her last office visit.  She reports a single incident over one year ago spontaneously resolved and was not evaluated.  She denies similar episodes occurring during her transition to menopause.  Patient reports sweating also occurs during the day.  She does endorse palpitations occasionally when she becomes diaphoretic and some shortness of breath but denies chest pain, cough, increased sputum production, or wheeze.  She denies history of fever or chills.  She does not have any known exposure to TB.  She does have known sarcoidosis which was followed by pulmonology several years ago but patient states this "cleared."  Left forearm rash: Onset 3 weeks ago.  Patient reports pruritus.  She denies prior history of similar rash but does endorse facial rash related to sarcoidosis in the past.  She stopped her chronic indapamide following the rash because she believed this could have been the source though patient reports indapamide use for several years.  She has no other areas of rash and denies fever.  ROS: see HPI for pertinent.  PMFSH: HTN, HLD, sarcoidosis, obesity, anemia. Surgical history wrist, vaginal hysterectomy, tubal, knee. Smoking status reviewed. Medications reviewed.  Objective   BP (!) 160/100   Pulse (!) 107   Temp 98.6 F (37 C) (Oral)   Wt 257 lb 9.6 oz (116.8 kg)   SpO2 94%   BMI 48.67 kg/m  Vitals and nursing note reviewed.  General: significant diaphoresis particularly on face and neck with drenched shirt, NAD with non-toxic appearance HEENT: normocephalic, atraumatic, moist mucous membranes Neck: supple, non-tender without cervical or  axillary lymphadenopathy, no thyromegaly Cardiovascular: regular rate and rhythm without murmurs, rubs, or gallops Lungs: clear to auscultation bilaterally with normal work of breathing Abdomen: obese, soft, non-tender, non-distended, normoactive bowel sounds, no hepatosplenomegaly Skin: warm, dry, no rashes or lesions, cap refill < 2 seconds Extremities: warm and well perfused, normal tone, no edema Neuro: grossly moving all extremities, A&Ox3      Assessment & Plan   Rash Acute.  Uncertain etiology.  Given history of sarcoidosis, could be cutaneous manifestation though biopsy would be required.  Differential includes contact dermatitis or eczema.  Unlikely related to indapamide use given chronic tolerance. - Recommended over-the-counter hydrocortisone - Ambulatory referral to pulmonology for sarcoidosis follow-up  Unexplained night sweats Acute.  Uncertain etiology.  Hot flash is possible though infectious sources must be ruled out.  Patient does have known sarcoidosis with no recent follow-up making sarcoidosis flare a possibility.  Differential also includes the less likely bacteremia, tuberculosis, thyroid abnormality, HIV.  Medications reviewed without identified side effects to support cause. - Checking CBC with differential, blood culture x2, TSH, HIV, QuantiFERON-TB gold plus, 2 view chest x-ray - Ambulatory referral to pulmonology for sarcoidosis follow-up - Scheduled patient with PCP for 02/20/2018 - Reviewed return precautions  Orders Placed This Encounter  Procedures  . Culture, blood (single)  . Culture, blood (single)  . DG Chest 2 View    Standing Status:   Future    Standing Expiration Date:   04/05/2019    Order Specific Question:   Reason for Exam (SYMPTOM  OR DIAGNOSIS  REQUIRED)    Answer:   Sarcoidosis    Order Specific Question:   Preferred imaging location?    Answer:   [facility]    Order Specific Question:   Call Results- Best Contact Number?     Answer:   [phone]    Order Specific Question:   Radiology Contrast Protocol - do NOT remove file path    Answer:   \\[email or web address].pdf  . QuantiFERON-TB Gold Plus  . CBC with Differential/Platelet  . TSH  . HIV antibody (with reflex)  . Ambulatory referral to Pulmonology    Referral Priority:   Routine    Referral Type:   Consultation    Referral Reason:   Specialty Services Required    Requested Specialty:   Pulmonary Disease    Number of Visits Requested:   1   No orders of the defined types were placed in this encounter.   Durward Parcel, DO [facility], PGY-3 02/01/2018, 5:43 PM

## 2018-02-01 NOTE — Assessment & Plan Note (Signed)
Acute.  Uncertain etiology.  Hot flash is possible though infectious sources must be ruled out.  Patient does have known sarcoidosis with no recent follow-up making sarcoidosis flare a possibility.  Differential also includes the less likely bacteremia, tuberculosis, thyroid abnormality, HIV.  Medications reviewed without identified side effects to support cause. - Checking CBC with differential, blood culture x2, TSH, HIV, QuantiFERON-TB gold plus, 2 view chest x-ray - Ambulatory referral to pulmonology for sarcoidosis follow-up - Scheduled patient with PCP for 02/20/2018 - Reviewed return precautions

## 2018-02-02 ENCOUNTER — Ambulatory Visit (HOSPITAL_COMMUNITY)
Admission: RE | Admit: 2018-02-02 | Discharge: 2018-02-02 | Disposition: A | Discharge status: Home / Self Care | Payer: BLUE CROSS/BLUE SHIELD | Source: Ambulatory Visit | Attending: Family Medicine | Admitting: Family Medicine

## 2018-02-02 ENCOUNTER — Other Ambulatory Visit: Payer: BLUE CROSS/BLUE SHIELD

## 2018-02-02 DIAGNOSIS — D869 Sarcoidosis, unspecified: Secondary | ICD-10-CM | POA: Diagnosis present

## 2018-02-02 DIAGNOSIS — R61 Generalized hyperhidrosis: Secondary | ICD-10-CM | POA: Insufficient documentation

## 2018-02-06 LAB — QUANTIFERON-TB GOLD PLUS
QUANTIFERON-TB GOLD PLUS: NEGATIVE
QuantiFERON Mitogen Value: >10 IU/mL
QuantiFERON Nil Value: 0.02 IU/mL
QuantiFERON TB1 Ag Value: 0.02 IU/mL
QuantiFERON TB2 Ag Value: 0.02 IU/mL

## 2018-02-06 LAB — CBC WITH DIFFERENTIAL/PLATELET
Basophils Absolute: 0 10*3/uL (ref 0.0–0.2)
Basos: 1 %
EOS (ABSOLUTE): 0.1 10*3/uL (ref 0.0–0.4)
Eos: 3 %
Hematocrit: 34.4 % (ref 34.0–46.6)
Hemoglobin: 11.9 g/dL (ref 11.1–15.9)
IMMATURE GRANULOCYTES: 0 %
Immature Grans (Abs): 0 10*3/uL (ref 0.0–0.1)
Lymphocytes Absolute: 1.8 10*3/uL (ref 0.7–3.1)
Lymphs: 40 %
MCH: 33.2 pg — ABNORMAL HIGH (ref 26.6–33.0)
MCHC: 34.6 g/dL (ref 31.5–35.7)
MCV: 96 fL (ref 79–97)
MONOS ABS: 0.4 10*3/uL (ref 0.1–0.9)
Monocytes: 8 %
NEUTROS PCT: 48 %
Neutrophils Absolute: 2.2 10*3/uL (ref 1.4–7.0)
PLATELETS: 319 10*3/uL (ref 150–450)
RBC: 3.58 x10E6/uL — AB (ref 3.77–5.28)
RDW: 12.9 % (ref 12.3–15.4)
WBC: 4.4 10*3/uL (ref 3.4–10.8)

## 2018-02-06 LAB — HIV ANTIBODY (ROUTINE TESTING W REFLEX): HIV SCREEN 4TH GENERATION: NONREACTIVE

## 2018-02-06 LAB — TSH: TSH: 2.33 u[IU]/mL (ref 0.450–4.500)

## 2018-02-08 LAB — CULTURE, BLOOD (SINGLE)

## 2018-02-10 ENCOUNTER — Encounter: Payer: Self-pay | Admitting: Family Medicine

## 2018-02-17 ENCOUNTER — Emergency Department (HOSPITAL_COMMUNITY)
Admission: EM | Admit: 2018-02-17 | Discharge: 2018-02-18 | Disposition: A | Discharge status: Home / Self Care | Payer: BLUE CROSS/BLUE SHIELD | Attending: Emergency Medicine | Admitting: Emergency Medicine

## 2018-02-17 ENCOUNTER — Emergency Department (HOSPITAL_COMMUNITY): Payer: BLUE CROSS/BLUE SHIELD

## 2018-02-17 ENCOUNTER — Encounter (HOSPITAL_COMMUNITY): Payer: Self-pay | Admitting: Emergency Medicine

## 2018-02-17 DIAGNOSIS — R1013 Epigastric pain: Secondary | ICD-10-CM | POA: Diagnosis present

## 2018-02-17 DIAGNOSIS — K529 Noninfective gastroenteritis and colitis, unspecified: Secondary | ICD-10-CM | POA: Diagnosis not present

## 2018-02-17 DIAGNOSIS — R112 Nausea with vomiting, unspecified: Secondary | ICD-10-CM

## 2018-02-17 DIAGNOSIS — I10 Essential (primary) hypertension: Secondary | ICD-10-CM | POA: Insufficient documentation

## 2018-02-17 DIAGNOSIS — R197 Diarrhea, unspecified: Secondary | ICD-10-CM

## 2018-02-17 DIAGNOSIS — Z79899 Other long term (current) drug therapy: Secondary | ICD-10-CM | POA: Insufficient documentation

## 2018-02-17 LAB — CBC
HEMATOCRIT: 38.2 % (ref 36.0–46.0)
HEMOGLOBIN: 12.7 g/dL (ref 12.0–15.0)
MCH: 32.7 pg (ref 26.0–34.0)
MCHC: 33.2 g/dL (ref 30.0–36.0)
MCV: 98.5 fL (ref 78.0–100.0)
Platelets: 331 10*3/uL (ref 150–400)
RBC: 3.88 MIL/uL (ref 3.87–5.11)
RDW: 11.3 % — ABNORMAL LOW (ref 11.5–15.5)
WBC: 7.8 10*3/uL (ref 4.0–10.5)

## 2018-02-17 LAB — COMPREHENSIVE METABOLIC PANEL
ALK PHOS: 76 U/L (ref 38–126)
ALT: 13 U/L (ref 0–44)
ANION GAP: 13 (ref 5–15)
AST: 21 U/L (ref 15–41)
Albumin: 4 g/dL (ref 3.5–5.0)
BILIRUBIN TOTAL: 0.6 mg/dL (ref 0.3–1.2)
BUN: 12 mg/dL (ref 8–23)
CALCIUM: 9.4 mg/dL (ref 8.9–10.3)
CO2: 22 mmol/L (ref 22–32)
Chloride: 104 mmol/L (ref 98–111)
Creatinine, Ser: 0.84 mg/dL (ref 0.44–1.00)
GLUCOSE: 122 mg/dL — AB (ref 70–99)
POTASSIUM: 4 mmol/L (ref 3.5–5.1)
Sodium: 139 mmol/L (ref 135–145)
TOTAL PROTEIN: 7.8 g/dL (ref 6.5–8.1)

## 2018-02-17 LAB — LIPASE, BLOOD: Lipase: 29 U/L (ref 11–51)

## 2018-02-17 MED ORDER — ONDANSETRON 4 MG PO TBDP
4.0000 mg | ORAL_TABLET | Freq: Once | ORAL | Status: AC
  Administered 2018-02-17: 4 mg via ORAL
  Filled 2018-02-17: qty 1

## 2018-02-17 MED ORDER — GI COCKTAIL ~~LOC~~
30.0000 mL | Freq: Once | ORAL | Status: AC
  Administered 2018-02-18: 30 mL via ORAL
  Filled 2018-02-17: qty 30

## 2018-02-17 NOTE — ED Triage Notes (Signed)
Pt arrives from home for c.o. Nausea and diarrhea for 2 weeks, seen for the same. Pain is to upper abdomen with diarrhea.

## 2018-02-17 NOTE — ED Provider Notes (Signed)
Patient placed in Quick Look pathway, seen and evaluated   Chief Complaint: Upper abdominal pain, nausea  HPI: Patient with history of hysterectomy presents the emergency department today with complaint of acute onset epigastric pain with nausea and no vomiting worsening this morning.  Patient has had diarrhea and nausea over the past couple of weeks.  She denies fevers.  No urinary symptoms.  She reports intermittent chest pains, including today but not at time of exam.  ROS:  Positive ROS: (+) Abdominal pain, nausea Negative ROS: (-) Fever, vomiting  Physical Exam:   Gen: No distress  Neuro: Awake and Alert  Skin: Warm    Focused Exam: Heart RRR, nml S1,S2, no m/r/g; Lungs CTAB; Abd soft, moderate right upper quadrant and epigastric tenderness, no rebound or guarding; Ext 2+ pedal pulses bilaterally, no edema.  BP (!) 168/108 (BP Location: Right Arm)   Pulse 92   Temp 98.2 F (36.8 C) (Oral)   Resp (!) 22   SpO2 99%   Plan: We will check lab work, UA, EKG and abdominal ultrasound.  Patient with IV dye allergy.  Initiation of care has begun. The patient has been counseled on the process, plan, and necessity for staying for the completion/evaluation, and the remainder of the medical screening examination    Renne Crigler, Cordelia Poche 02/17/18 1800    Cathren Laine, MD 02/17/18 1801

## 2018-02-18 LAB — URINALYSIS, ROUTINE W REFLEX MICROSCOPIC
BILIRUBIN URINE: NEGATIVE
Glucose, UA: NEGATIVE mg/dL
Hgb urine dipstick: NEGATIVE
KETONES UR: NEGATIVE mg/dL
LEUKOCYTES UA: NEGATIVE
NITRITE: NEGATIVE
Protein, ur: NEGATIVE mg/dL
SPECIFIC GRAVITY, URINE: 1.02 (ref 1.005–1.030)
pH: 5 (ref 5.0–8.0)

## 2018-02-18 MED ORDER — LOPERAMIDE HCL 2 MG PO CAPS: 2.0000 mg | ORAL_CAPSULE | Freq: Four times a day (QID) | ORAL | 0 refills | Status: DC | PRN

## 2018-02-18 MED ORDER — SUCRALFATE 1 GM/10ML PO SUSP: 1.0000 g | Freq: Three times a day (TID) | ORAL | 0 refills | Status: DC

## 2018-02-18 MED ORDER — ONDANSETRON 4 MG PO TBDP: 4.0000 mg | ORAL_TABLET | Freq: Three times a day (TID) | ORAL | 0 refills | Status: DC | PRN

## 2018-02-18 NOTE — ED Provider Notes (Signed)
[facility] EMERGENCY DEPARTMENT Provider Note   CSN: [id number] Arrival date & time: 02/17/18  1727     History   Chief Complaint Chief Complaint  Patient presents with  . Abdominal Pain    HPI Michelle Rios is a 62 y.o. female.  Patient with history of sarcoid (on no regular medications), HLD, HTN presents with nausea, diarrhea and epigastric abdominal pain. She reports the diarrhea have been ongoing for greater than one week. She reports fever but has not taken her temperature. Her pain has been limited to the epigastrium. When she ate breakfast this morning the pain worsened. No vomiting. No melena or hematochezia. She reports now she is having greater than 5 bowel movements daily described as yellow. She has not taken anything at home for symptoms. She has not seen her PCP but has an appointment scheduled for Monday (in 3 days). She states she has had similar symptoms in the past that was diagnosed as "gastroenteritis".   The history is provided by the patient. No language interpreter was used.  Abdominal Pain   Associated symptoms include fever (Subjective), diarrhea and nausea. Pertinent negatives include vomiting and myalgias.    Past Medical History:  Diagnosis Date  . AKI (acute kidney injury) (HCC) 09/15/2015  . Diarrhea 09/15/2015  . Hypertension   . Sarcoidosis     Patient Active Problem List   Diagnosis Date Noted  . Unexplained night sweats 02/01/2018  . Rash 02/01/2018  . Anemia 12/06/2017  . HLD (hyperlipidemia) 12/06/2017  . Acute reaction to stress 11/12/2017  . Essential hypertension 02/09/2017  . Severe obesity (BMI >= 40) (HCC) 02/09/2017  . Sarcoidosis 02/09/2017  . Right upper quadrant abdominal tenderness   . SOB (shortness of breath)     Past Surgical History:  Procedure Laterality Date  . KNEE CARTILAGE SURGERY    . TUBAL LIGATION    . VAGINAL HYSTERECTOMY    . WRIST SURGERY       OB History   None      Home  Medications    Prior to Admission medications   Medication Sig Start Date End Date Taking? Authorizing Provider  albuterol (PROVENTIL HFA;VENTOLIN HFA) 108 (90 Base) MCG/ACT inhaler Inhale 2 puffs into the lungs every 6 (six) hours as needed for wheezing or shortness of breath. Patient not taking: Reported on 02/01/2018 05/01/16   Hayden Rasmussen, NP  indapamide (LOZOL) 2.5 MG tablet Take 1 tablet (2.5 mg total) by mouth daily. Patient not taking: Reported on 02/01/2018 01/02/18   Garth Bigness, MD  polyethylene glycol powder [facility]) powder Please take 1 capful daily mixed in water. Increase amount as needed to have 1 soft bowel movement daily. 11/08/17   Casey Burkitt, MD    Family History Family History  Problem Relation Age of Onset  . Heart attack Father   . Heart disease Mother   . Diabetes Mother   . Stroke Paternal Grandmother   . Breast cancer Sister     Social History Social History   Tobacco Use  . Smoking status: Never Smoker  . Smokeless tobacco: Never Used  Substance Use Topics  . Alcohol use: No  . Drug use: No     Allergies   Ivp dye [iodinated diagnostic agents]   Review of Systems Review of Systems  Constitutional: Positive for fever (Subjective). Negative for chills.  HENT: Negative.   Respiratory: Negative.   Cardiovascular: Negative.   Gastrointestinal: Positive for abdominal pain, diarrhea and nausea.  Negative for blood in stool and vomiting.  Genitourinary: Negative.   Musculoskeletal: Negative.  Negative for myalgias.  Skin: Negative.   Neurological: Negative.  Negative for syncope and light-headedness.     Physical Exam Updated Vital Signs BP (!) 144/85   Pulse 76   Temp 98.2 F (36.8 C) (Oral)   Resp 16   SpO2 97%   Physical Exam  Constitutional: She appears well-developed and well-nourished.  HENT:  Head: Normocephalic.  Mouth/Throat: Oropharynx is clear and moist.  Neck: Normal range of motion. Neck supple.   Cardiovascular: Normal rate and regular rhythm.  Pulmonary/Chest: Effort normal and breath sounds normal.  Abdominal: Soft. Bowel sounds are normal. There is tenderness in the epigastric area. There is no rebound and no guarding. No hernia.  Musculoskeletal: Normal range of motion.  Neurological: She is alert. No cranial nerve deficit.  Skin: Skin is warm and dry. No rash noted.  Psychiatric: She has a normal mood and affect.  Nursing note and vitals reviewed.    ED Treatments / Results  Labs (all labs ordered are listed, but only abnormal results are displayed) Labs Reviewed  COMPREHENSIVE METABOLIC PANEL - Abnormal; Notable for the following components:      Result Value   Glucose, Bld 122 (*)    All other components within normal limits  CBC - Abnormal; Notable for the following components:   RDW 11.3 (*)    All other components within normal limits  URINALYSIS, ROUTINE W REFLEX MICROSCOPIC - Abnormal; Notable for the following components:   APPearance HAZY (*)    All other components within normal limits  LIPASE, BLOOD    EKG EKG Interpretation  Date/Time:  Friday February 17 2018 18:02:55 EDT Ventricular Rate:  83 PR Interval:  174 QRS Duration: 74 QT Interval:  356 QTC Calculation: 418 R Axis:   1 Text Interpretation:  Normal sinus rhythm Septal infarct , age undetermined Abnormal ECG When compared with ECG of 08/01/2011, No significant change was found Confirmed by Dione Booze ([id number]) on 02/17/2018 10:45:40 PM   Radiology US Abdomen Complete  Result Date: 02/17/2018 CLINICAL DATA:  Epigastric pain for 2 weeks.  Diarrhea EXAM: ABDOMEN ULTRASOUND COMPLETE COMPARISON:  09/16/2015 FINDINGS: Gallbladder: No gallstones or wall thickening visualized. No sonographic Murphy sign noted by sonographer. Common bile duct: Diameter: 2 mm Liver: No focal lesion identified. Within normal limits in parenchymal echogenicity. Portal vein is patent on color Doppler imaging with normal  direction of blood flow towards the liver. IVC: No abnormality visualized. Pancreas: Limited visualization. Spleen: Size and appearance within normal limits. Right Kidney: Length: 10 cm. Echogenicity within normal limits. No mass or hydronephrosis visualized. Left Kidney: Length: 11 cm. Echogenicity within normal limits. No mass or hydronephrosis visualized. Abdominal aorta: No aneurysm visualized. IMPRESSION: 1. No acute finding. 2. The pancreas could not be visualized. Electronically Signed   By: Marnee Spring M.D.   On: 02/17/2018 19:12    Procedures Procedures (including critical care time)  Medications Ordered in ED Medications  ondansetron (ZOFRAN-ODT) disintegrating tablet 4 mg (4 mg Oral Given 02/17/18 2354)  gi cocktail (Maalox,Lidocaine,Donnatal) (30 mLs Oral Given 02/18/18 0005)     Initial Impression / Assessment and Plan / ED Course  I have reviewed the triage vital signs and the nursing notes.  Pertinent labs & imaging results that were available during my care of the patient were reviewed by me and considered in my medical decision making (see chart for details).     Patient  with prolonged symptoms diarrhea, epigastric pain, nausea with vomiting and subjective fever.   Labs are unremarkable. US abdomen performed and is negative. zofran provided with relief of nausea. GI cocktail given which completely resolved her pain. Repeat exam is nontender.   She appears hydrated. VSS. Afebrile. She has been relieved of symptoms and appears stable for discharge home. Return precautions discussed. She will see her doctor in 3 days as scheduled.   Final Clinical Impressions(s) / ED Diagnoses   Final diagnoses:  None   1. Gastroenteritis 2. Epigastric pain  ED Discharge Orders    None       Danne Harbor 02/18/18 0126    Dione Booze, MD 02/18/18 [phone]

## 2018-02-18 NOTE — Discharge Instructions (Signed)
Take medications as prescribed and keep your appointment on Monday with your doctor for recheck of symptoms. Return here over the weekend if you have any high fever, severe pain, bloody vomit or diarrhea.

## 2018-02-20 ENCOUNTER — Encounter (HOSPITAL_COMMUNITY): Payer: Self-pay | Admitting: General Practice

## 2018-02-20 ENCOUNTER — Observation Stay (HOSPITAL_COMMUNITY)
Admission: AD | Admit: 2018-02-20 | Discharge: 2018-02-21 | Disposition: A | Discharge status: Home / Self Care | Payer: BLUE CROSS/BLUE SHIELD | Source: Ambulatory Visit | Attending: Family Medicine | Admitting: Family Medicine

## 2018-02-20 ENCOUNTER — Inpatient Hospital Stay (HOSPITAL_COMMUNITY): Payer: BLUE CROSS/BLUE SHIELD

## 2018-02-20 ENCOUNTER — Encounter: Payer: Self-pay | Admitting: Family Medicine

## 2018-02-20 ENCOUNTER — Other Ambulatory Visit: Payer: Self-pay

## 2018-02-20 ENCOUNTER — Ambulatory Visit (INDEPENDENT_AMBULATORY_CARE_PROVIDER_SITE_OTHER): Payer: BLUE CROSS/BLUE SHIELD | Admitting: Family Medicine

## 2018-02-20 VITALS — BP 134/82 | HR 101 | Temp 98.7°F | Ht 61.0 in | Wt 248.4 lb

## 2018-02-20 DIAGNOSIS — R61 Generalized hyperhidrosis: Secondary | ICD-10-CM | POA: Diagnosis present

## 2018-02-20 DIAGNOSIS — Z6841 Body Mass Index (BMI) 40.0 and over, adult: Secondary | ICD-10-CM | POA: Insufficient documentation

## 2018-02-20 DIAGNOSIS — D649 Anemia, unspecified: Secondary | ICD-10-CM | POA: Diagnosis not present

## 2018-02-20 DIAGNOSIS — R1013 Epigastric pain: Secondary | ICD-10-CM | POA: Diagnosis not present

## 2018-02-20 DIAGNOSIS — I700 Atherosclerosis of aorta: Secondary | ICD-10-CM | POA: Diagnosis not present

## 2018-02-20 DIAGNOSIS — M47814 Spondylosis without myelopathy or radiculopathy, thoracic region: Secondary | ICD-10-CM | POA: Diagnosis not present

## 2018-02-20 DIAGNOSIS — D869 Sarcoidosis, unspecified: Secondary | ICD-10-CM | POA: Diagnosis not present

## 2018-02-20 DIAGNOSIS — R110 Nausea: Secondary | ICD-10-CM | POA: Diagnosis not present

## 2018-02-20 DIAGNOSIS — Z90710 Acquired absence of both cervix and uterus: Secondary | ICD-10-CM | POA: Insufficient documentation

## 2018-02-20 DIAGNOSIS — I10 Essential (primary) hypertension: Secondary | ICD-10-CM | POA: Diagnosis not present

## 2018-02-20 DIAGNOSIS — M47816 Spondylosis without myelopathy or radiculopathy, lumbar region: Secondary | ICD-10-CM | POA: Diagnosis not present

## 2018-02-20 DIAGNOSIS — R197 Diarrhea, unspecified: Secondary | ICD-10-CM | POA: Diagnosis present

## 2018-02-20 DIAGNOSIS — Z87892 Personal history of anaphylaxis: Secondary | ICD-10-CM | POA: Insufficient documentation

## 2018-02-20 DIAGNOSIS — R1084 Generalized abdominal pain: Secondary | ICD-10-CM | POA: Diagnosis not present

## 2018-02-20 DIAGNOSIS — K5730 Diverticulosis of large intestine without perforation or abscess without bleeding: Secondary | ICD-10-CM | POA: Insufficient documentation

## 2018-02-20 DIAGNOSIS — Z79899 Other long term (current) drug therapy: Secondary | ICD-10-CM | POA: Diagnosis not present

## 2018-02-20 DIAGNOSIS — Z8249 Family history of ischemic heart disease and other diseases of the circulatory system: Secondary | ICD-10-CM | POA: Diagnosis not present

## 2018-02-20 DIAGNOSIS — Z803 Family history of malignant neoplasm of breast: Secondary | ICD-10-CM | POA: Diagnosis not present

## 2018-02-20 DIAGNOSIS — R109 Unspecified abdominal pain: Secondary | ICD-10-CM

## 2018-02-20 DIAGNOSIS — Z91041 Radiographic dye allergy status: Secondary | ICD-10-CM | POA: Diagnosis not present

## 2018-02-20 DIAGNOSIS — R1010 Upper abdominal pain, unspecified: Secondary | ICD-10-CM | POA: Diagnosis present

## 2018-02-20 DIAGNOSIS — E66813 Obesity, class 3: Secondary | ICD-10-CM | POA: Diagnosis present

## 2018-02-20 HISTORY — DX: Sarcoidosis of lung: D86.0

## 2018-02-20 HISTORY — DX: Headache, unspecified: R51.9

## 2018-02-20 HISTORY — DX: Unspecified asthma, uncomplicated: J45.909

## 2018-02-20 HISTORY — DX: Personal history of other medical treatment: Z92.89

## 2018-02-20 HISTORY — DX: Unspecified osteoarthritis, unspecified site: M19.90

## 2018-02-20 HISTORY — DX: Headache: R51

## 2018-02-20 HISTORY — DX: Anemia, unspecified: D64.9

## 2018-02-20 HISTORY — DX: Other pulmonary embolism without acute cor pulmonale: I26.99

## 2018-02-20 LAB — CBC
HCT: 37.6 % (ref 36.0–46.0)
HCT: 34.5 % — ABNORMAL LOW (ref 36.0–46.0)
Hemoglobin: 12.8 g/dL (ref 12.0–15.0)
Hemoglobin: 11.9 g/dL — ABNORMAL LOW (ref 12.0–15.0)
MCH: 33.1 pg (ref 26.0–34.0)
MCH: 33.1 pg (ref 26.0–34.0)
MCHC: 34 g/dL (ref 30.0–36.0)
MCHC: 34.5 g/dL (ref 30.0–36.0)
MCV: 97.2 fL (ref 78.0–100.0)
MCV: 96.1 fL (ref 78.0–100.0)
Platelets: 250 10*3/uL (ref 150–400)
Platelets: 289 10*3/uL (ref 150–400)
RBC: 3.87 MIL/uL (ref 3.87–5.11)
RBC: 3.59 MIL/uL — AB (ref 3.87–5.11)
RDW: 11.3 % — AB (ref 11.5–15.5)
RDW: 11.3 % — ABNORMAL LOW (ref 11.5–15.5)
WBC: 5.8 10*3/uL (ref 4.0–10.5)
WBC: 5.5 10*3/uL (ref 4.0–10.5)

## 2018-02-20 LAB — BASIC METABOLIC PANEL
ANION GAP: 7 (ref 5–15)
BUN: 8 mg/dL (ref 8–23)
CO2: 28 mmol/L (ref 22–32)
Calcium: 9.3 mg/dL (ref 8.9–10.3)
Chloride: 106 mmol/L (ref 98–111)
Creatinine, Ser: 0.8 mg/dL (ref 0.44–1.00)
GFR calc non Af Amer: >60 mL/min (ref >60)
Glucose, Bld: 97 mg/dL (ref 70–99)
POTASSIUM: 3.8 mmol/L (ref 3.5–5.1)
SODIUM: 141 mmol/L (ref 135–145)

## 2018-02-20 LAB — HEPATIC FUNCTION PANEL
ALBUMIN: 3.8 g/dL (ref 3.5–5.0)
ALT: 13 U/L (ref 0–44)
AST: 18 U/L (ref 15–41)
Alkaline Phosphatase: 72 U/L (ref 38–126)
BILIRUBIN DIRECT: 0.2 mg/dL (ref 0.0–0.2)
Indirect Bilirubin: 0.5 mg/dL (ref 0.3–0.9)
Total Bilirubin: 0.7 mg/dL (ref 0.3–1.2)
Total Protein: 7.2 g/dL (ref 6.5–8.1)

## 2018-02-20 MED ORDER — ONDANSETRON 4 MG PO TBDP
4.0000 mg | ORAL_TABLET | Freq: Three times a day (TID) | ORAL | Status: DC | PRN
  Administered 2018-02-20: 4 mg via ORAL
  Filled 2018-02-20: qty 1

## 2018-02-20 MED ORDER — SODIUM CHLORIDE 0.9 % IV SOLN
INTRAVENOUS | Status: DC
  Administered 2018-02-20 – 2018-02-21 (×2): via INTRAVENOUS

## 2018-02-20 MED ORDER — ENOXAPARIN SODIUM 40 MG/0.4ML ~~LOC~~ SOLN
40.0000 mg | SUBCUTANEOUS | Status: DC
  Administered 2018-02-20 – 2018-02-21 (×2): 40 mg via SUBCUTANEOUS
  Filled 2018-02-20: qty 0.4

## 2018-02-20 MED ORDER — ACETAMINOPHEN 325 MG PO TABS
650.0000 mg | ORAL_TABLET | Freq: Four times a day (QID) | ORAL | Status: DC | PRN
  Administered 2018-02-20: 650 mg via ORAL
  Filled 2018-02-20: qty 2

## 2018-02-20 MED ORDER — ENSURE ENLIVE PO LIQD
237.0000 mL | Freq: Two times a day (BID) | ORAL | Status: DC
  Administered 2018-02-21: 237 mL via ORAL

## 2018-02-20 MED ORDER — LOPERAMIDE HCL 2 MG PO CAPS: 2.0000 mg | ORAL_CAPSULE | Freq: Four times a day (QID) | ORAL | Status: DC | PRN

## 2018-02-20 MED ORDER — ALBUTEROL SULFATE (2.5 MG/3ML) 0.083% IN NEBU: 2.5000 mg | INHALATION_SOLUTION | Freq: Four times a day (QID) | RESPIRATORY_TRACT | Status: DC | PRN

## 2018-02-20 NOTE — Progress Notes (Addendum)
Family Medicine Teaching Service Daily Progress Note Intern Pager: [phone]  Patient name: Michelle Rios Medical record number: [id number] Date of birth: 1956-06-10 Age: 62 y.o. Gender: female  Primary Care Provider: Garth Bigness, MD Consultants: None Code Status: Full  Pt Overview and Major Events to Date:  7/29 Admitted  Assessment and Plan:  Abdominal pain, diarrhea, diaphoresis: Improved LFTs WNL, CBC WNL, BMP WNL.  CT Abdomen/pelvis showed only mild diverticulosis.  Patient denies diarrhea since 5am yesterday.  Denies abdominal pain.  Has been eating tolerating diet well. Doubt carcinoid syndrome as patient has not had episodes of flushing, only diaphroesis.  Likely viral illness as symptoms have resolved. -f/u 5-HIAA, 24 hr collection started ~3pm yesterday - cont tylenol prn pain - d/c imodium - cont zofran prn nausea - d/c mIVF  - consider stool panel as outpatient if no BM today while inpatient - will start simethicone 80mg  QID prn for bloating and pain  HTN: Chronic, stable BP this AM 113/77 -cont to hold indapamide -cont to monitor BP - d/c mIVF  Anemia Hgb 12.8>11.9>11.8.  MCV WNL.  Patient hemodynamically stable and asymptomatic. - f/u CBC - FOBT - will need f/u with PCP for GI consult and possible colonoscopy (UTD not found in chart review)   Sarcoidosis: Chronic O2 sats 94% RA.  Denies SOB currently. - f/u with pulmonology outpatient - cont albuterol q6 prn  FEN/GI: Regular PPx: Lovenox  Disposition: home today following 24hr collection of urine and FOBT  Subjective:  Patient denies complaints this AM.  States that she is feeling well and able to eat.  Denies diarrhea, abdominal pain.  Objective: Temp:  [97.4 F (36.3 C)-98.7 F (37.1 C)] 97.4 F (36.3 C) (07/30 0437) Pulse Rate:  [65-101] 65 (07/30 0437) Resp:  [18-22] 18 (07/30 0437) BP: (103-134)/(73-82) 113/77 (07/30 0437) SpO2:  [94 %-99 %] 94 % (07/30 0437) Weight:  [248 lb 6.4  oz (112.7 kg)-248 lb 7.3 oz (112.7 kg)] 248 lb 7.3 oz (112.7 kg) (07/29 1100)  Physical Exam: General: 62 y.o. y.o. female in NAD Cardio: RRR, no m/r/g Lungs: CTAB, no wheezing, no rhonchi, no crackles Abdomen: Soft, mild epigastric tenderness, positive bowel sounds Skin: warm and dry Extremities: No edema   Laboratory: Recent Labs  Lab 02/20/18 1438 02/20/18 1606 02/21/18 0634  WBC 5.8 5.5 4.4  HGB 12.8 11.9* 11.8*  HCT 37.6 34.5* 34.9*  PLT 250 289 282   Recent Labs  Lab 02/17/18 1742 02/20/18 1606 02/21/18 0634  NA 139 141 134*  K 4.0 3.8 3.5  CL 104 106 100  CO2 22 28 25   BUN 12 8 6*  CREATININE 0.84 0.80 0.77  CALCIUM 9.4 9.3 8.8*  PROT 7.8 7.2  --   BILITOT 0.6 0.7  --   ALKPHOS 76 72  --   ALT 13 13  --   AST 21 18  --   GLUCOSE 122* 97 113*    Hepatic Function Panel     Component Value Date/Time   PROT 7.2 02/20/2018 1606   PROT 6.4 12/01/2017 1611   ALBUMIN 3.8 02/20/2018 1606   ALBUMIN 3.7 12/01/2017 1611   AST 18 02/20/2018 1606   ALT 13 02/20/2018 1606   ALKPHOS 72 02/20/2018 1606   BILITOT 0.7 02/20/2018 1606   BILITOT 0.3 12/01/2017 1611   BILIDIR 0.2 02/20/2018 1606   IBILI 0.5 02/20/2018 1606    Imaging/Diagnostic Tests: Ct Abdomen Pelvis Wo Contrast  Result Date: 02/21/2018 CLINICAL DATA:  Upper abdominal  pain and nausea. Diarrhea. Symptoms for 3 weeks. EXAM: CT ABDOMEN AND PELVIS WITHOUT CONTRAST TECHNIQUE: Multidetector CT imaging of the abdomen and pelvis was performed following the standard protocol without IV contrast. COMPARISON:  Abdominal ultrasound yesterday. FINDINGS: Lower chest: Scattered subpleural ground-glass opacities in both lower lobes, likely secondary to scarring. No confluent consolidation. No pleural fluid. Upper normal heart size. Atherosclerosis of thoracic aorta. Hepatobiliary: Calcified granuloma in the posterior right lobe of the liver. No focal hepatic lesion on noncontrast exam. Gallbladder physiologically  distended, no calcified stone. No biliary dilatation. Pancreas: No ductal dilatation or inflammation. Spleen: Normal in size without focal abnormality. Adrenals/Urinary Tract: Adrenal glands. No hydronephrosis. No perinephric edema. No urolithiasis. Urinary bladder is nondistended, no bladder wall thickening. Stomach/Bowel: Stomach is within normal limits. No evidence of bowel wall thickening, distention, or inflammatory changes. Formed stool in the colon. No colonic inflammation. Minimal diverticulosis of the descending colon without diverticulitis. Cecum located in the midline pelvis, normal appendix at the midline. Vascular/Lymphatic: Aortic atherosclerosis without aneurysm. No abdominopelvic adenopathy. Reproductive: Post hysterectomy. Ovaries are quiescent. Incidental phleboliths in the right ovarian vein. Other: No free air, free fluid, or intra-abdominal fluid collection. Minimal air in the lower anterior abdominal wall subcutaneous fat in a pattern consistent with subcutaneous injections. Musculoskeletal: Degenerative change in the spine with large anterior osteophytes in the lower thoracic spine and facet hypertrophy the lower lumbar spine. There are no acute or suspicious osseous abnormalities. IMPRESSION: 1. No acute findings or explanation for abdominal pain. 2. Mild colonic diverticulosis without diverticulitis. 3.  Aortic Atherosclerosis (ICD10-I70.0). Electronically Signed   By: Rubye Oaks M.D.   On: 02/21/2018 00:25    Linkon Siverson, Solmon Ice, DO 02/21/2018, 7:48 AM PGY-1, [facility] FPTS Intern pager: [phone], text pages welcome

## 2018-02-20 NOTE — H&P (Addendum)
Family Medicine Teaching [facility] Admission History and Physical Service Pager: [phone]  Patient name: Michelle Rios Medical record number: [id number] Date of birth: Feb 22, 1956 Age: 62 y.o. Gender: female  Primary Care Provider: Garth Bigness, MD Consultants: None Code Status: Full   Chief Complaint: Abdominal pain, nausea, diarrhea, diaphoresis  Assessment and Plan: Michelle Rios is a 62 y.o. female presenting with 1 month history of abdominal pain, nausea, diarrhea, and diaphoresis . PMH is significant for sarcoidosis, HTN.  Abdominal pain, diarrhea, diaphoresis Patient notes a 1 month history of dull epigastric pain, sweating spells, yellow watery diarrhea, nausea, and decreased oral intake.  She denies fevers or vomiting.  She was seen in the family medicine clinic on 7/10 for the symptoms.  Prior workup showed HIV non-reactive, QuantiFERON gold negative, TSH WNL, blood cultures no growth x2.  The symptoms continued to worsen.  She went to the ED on 7/26.  She also notes that at this time her pastor had said that her eyes looked yellow.  She states that they have not been yellow since this time.  In the ED, U/S abdomen negative, GI cocktail resolved her pain, Lipase, CMP, and CBC within normal limits, UA was negative.  Patient was seen by her PCP at the family medicine clinic today for follow-up.  She notes that she has been unable to eat since the start of the symptoms.  Today she complains of a headache.  States that her urine is dark.  Her pain is also noted to be 10/10.  Epigastric tenderness on palpation, negative Murphy's, no guarding or rebound.  Afebrile.  States that these symptoms are similar to her first presentation of sarcoidosis in 55s.  Doubt pancreatitis due to normal Lipase, doubt infectious etiology as normal blood culutres, WBC WNL on 7/26, afebrile.  Doubt gallbladder as negative Murphy's and abdominal ultrasound negative in ED.  Possible carcinoid  syndrome, will order 5-HIAA for workup.  Consider sarcoidosis flare involving GI tract/liver.  Likely dehydrated due to poor p.o. intake, diarrhea, and concentrated urine. - Admit to medsurg, Dr. Donnetta Hail Service - monitor vitals -OOB - CT abdomen/pelvis  - BMP, CBC, LFTs now, repeat in AM - 5-HIAA 24hr urine - Tylenol prn pain - Imodium prn diarrhea - Zofran prn nausea - Lovenox for prophylaxis - may need GI consult for endoscopy pending results of imaging and labs to evaluate further for sarcoid - NS IVF 100cc/hr  HTN On Indapamide at home.  States that when she picked up her refill Saturday it was "[place]" something.  She has been without BP meds since Saturday.  BP on admission 124/81. - hold indapamide - cont to monitor BP - IVF 100cc/hr  Sarcoidosis Has not been on steroids in over a year.  Has not followed up recently.  Outpatient pulmonary appointment had been scheduled for later this week.  SOB on exertion, stable per patient.  Denies chest pain.  Denies cough.  Has albuterol inhaler that she has not used recently. -cont albuterol 2.5mg  q6h prn -consider possible sarcoidosis flare involving GI tract/liver, would require endoscopy for diagnosis  FEN/GI: Regular Diet Prophylaxis: Lovenox  Disposition: admit to MedSurg  History of Present Illness:  Michelle Rios is a 61 y.o. female presenting with 1 month history of nausea, dull epigastric abdominal pain, diarrhea, and diaphoresis.  Patient notes a 1 month history of dull epigastric pain, sweating spells, yellow watery diarrhea, nausea, and decreased oral intake.  She denies fevers or vomiting.  She was seen  in the family medicine clinic on 7/10 for the symptoms.  The symptoms continued to worsen.  She went to the ED on 7/26 due to worsening abdominal pain and diarrhea x5 in one day.  She also notes that at this time her pastor had said that her eyes looked yellow.  She states that they have not been yellow since this time.  Patient was sent home by the ED and continued to have symptoms.  Patient was seen by her PCP at the family medicine clinic today for follow-up.  She notes that she has been unable to eat very much since the start of the symptoms and has not eaten a full meal since Friday.  Today she complains of a headache, that is in the temple region bilaterally and similar to chronic headaches she experiences.  States that her urine is dark.  Her pain is also noted to be 10/10 and in the epigastric region.  She denies new chest pain or shortness of breath.  She states that when she is walking, she gets short of breath and very sweaty.  She also complains of being sweaty at night.  She notes a 1 time episode of blurry vision on Wednesday that resolved.  She notes that she has had leg edema that improves with elevating feet and orthopnea for many years.   Review Of Systems: Per HPI with the following additions:  Review of Systems  Constitutional: Positive for diaphoresis. Negative for chills and fever.  Eyes: Positive for blurred vision (one episode, resolved).       "yellow eyes" on Friday  Respiratory: Positive for shortness of breath (on exertion). Negative for cough.   Cardiovascular: Positive for orthopnea and leg swelling. Negative for chest pain.  Gastrointestinal: Positive for abdominal pain, diarrhea (yellow, watery) and nausea. Negative for blood in stool, heartburn and vomiting.  Genitourinary: Negative for dysuria.  Skin: Positive for rash (left arm).  Neurological: Positive for headaches (chronic).    Patient Active Problem List   Diagnosis Date Noted  . Unexplained night sweats 02/01/2018  . Rash 02/01/2018  . Anemia 12/06/2017  . HLD (hyperlipidemia) 12/06/2017  . Acute reaction to stress 11/12/2017  . Essential hypertension 02/09/2017  . Severe obesity (BMI >= 40) (HCC) 02/09/2017  . Sarcoidosis 02/09/2017  . Right upper quadrant abdominal tenderness   . SOB (shortness of breath)      Past Medical History: Past Medical History:  Diagnosis Date  . AKI (acute kidney injury) (HCC) 09/15/2015  . Diarrhea 09/15/2015  . Hypertension   . Sarcoidosis     Past Surgical History: Past Surgical History:  Procedure Laterality Date  . KNEE CARTILAGE SURGERY    . TUBAL LIGATION    . VAGINAL HYSTERECTOMY    . WRIST SURGERY      Social History: Social History   Tobacco Use  . Smoking status: Never Smoker  . Smokeless tobacco: Never Used  Substance Use Topics  . Alcohol use: No  . Drug use: No   Additional social history: None  Please also refer to relevant sections of EMR.  Family History: Family History  Problem Relation Age of Onset  . Heart attack Father   . Heart disease Mother   . Diabetes Mother   . Stroke Paternal Grandmother   . Breast cancer Sister     Allergies and Medications: Allergies  Allergen Reactions  . Ivp Dye [Iodinated Diagnostic Agents] Anaphylaxis and Swelling   No current facility-administered medications on file prior to encounter.  Current Outpatient Medications on File Prior to Encounter  Medication Sig Dispense Refill  . albuterol (PROVENTIL HFA;VENTOLIN HFA) 108 (90 Base) MCG/ACT inhaler Inhale 2 puffs into the lungs every 6 (six) hours as needed for wheezing or shortness of breath. (Patient not taking: Reported on 02/01/2018) 1 Inhaler 0  . indapamide (LOZOL) 2.5 MG tablet Take 1 tablet (2.5 mg total) by mouth daily. (Patient not taking: Reported on 02/01/2018) 90 tablet 1  . loperamide (IMODIUM) 2 MG capsule Take 1 capsule (2 mg total) by mouth 4 (four) times daily as needed for diarrhea or loose stools. 12 capsule 0  . ondansetron (ZOFRAN ODT) 4 MG disintegrating tablet Take 1 tablet (4 mg total) by mouth every 8 (eight) hours as needed for nausea or vomiting. 20 tablet 0  . polyethylene glycol powder (GLYCOLAX/MIRALAX) powder Please take 1 capful daily mixed in water. Increase amount as needed to have 1 soft bowel movement  daily. 500 g 1  . sucralfate (CARAFATE) 1 GM/10ML suspension Take 10 mLs (1 g total) by mouth 4 (four) times daily -  with meals and at bedtime. 420 mL 0    Objective: BP 124/81 (BP Location: Left Arm)   Pulse 90   Temp 98.6 F (37 C) (Oral)   Resp (!) 22   Ht 5\' 1"  (1.549 m)   Wt 248 lb 7.3 oz (112.7 kg)   SpO2 99%   BMI 46.95 kg/m    Physical Exam  Constitutional: She is oriented to person, place, and time. No distress.  HENT:  Head: Normocephalic and atraumatic.  Eyes: Pupils are equal, round, and reactive to light. Right eye exhibits no discharge. Left eye exhibits no discharge. No scleral icterus.  Cardiovascular: Normal rate and regular rhythm. Exam reveals no gallop and no friction rub.  No murmur (2/6 systolic) heard. Pulmonary/Chest: Effort normal and breath sounds normal. No respiratory distress. She has no wheezes. She has no rales.  Abdominal: Soft. Bowel sounds are normal. She exhibits no distension. There is tenderness (epigastric). There is no rebound and no guarding.  Negative Murphy's  Musculoskeletal: She exhibits no edema or tenderness.  Neurological: She is alert and oriented to person, place, and time.  Skin: Skin is warm and dry. No rash (dry patch of skin on L lower arm) noted. She is not diaphoretic.  Psychiatric: She has a normal mood and affect.    Labs and Imaging: CBC BMET  Recent Labs  Lab 02/17/18 1742  WBC 7.8  HGB 12.7  HCT 38.2  PLT 331   Recent Labs  Lab 02/17/18 1742  NA 139  K 4.0  CL 104  CO2 22  BUN 12  CREATININE 0.84  GLUCOSE 122*  CALCIUM 9.4    US Abdomen Complete  Result Date: 02/17/2018 CLINICAL DATA:  Epigastric pain for 2 weeks.  Diarrhea EXAM: ABDOMEN ULTRASOUND COMPLETE COMPARISON:  09/16/2015 FINDINGS: Gallbladder: No gallstones or wall thickening visualized. No sonographic Murphy sign noted by sonographer. Common bile duct: Diameter: 2 mm Liver: No focal lesion identified. Within normal limits in parenchymal  echogenicity. Portal vein is patent on color Doppler imaging with normal direction of blood flow towards the liver. IVC: No abnormality visualized. Pancreas: Limited visualization. Spleen: Size and appearance within normal limits. Right Kidney: Length: 10 cm. Echogenicity within normal limits. No mass or hydronephrosis visualized. Left Kidney: Length: 11 cm. Echogenicity within normal limits. No mass or hydronephrosis visualized. Abdominal aorta: No aneurysm visualized. IMPRESSION: 1. No acute finding. 2. The pancreas could  not be visualized. Electronically Signed   By: Marnee Spring M.D.   On: 02/17/2018 19:12    Meccariello, Solmon Ice, DO 02/20/2018, 1:33 PM PGY-1, [facility] [facility] FPTS Intern pager: [phone], text pages welcome  FPTS Upper-Level Resident Addendum   I have independently interviewed and examined the patient. I have discussed the above with the original author and agree with their documentation. My edits for correction/addition/clarification are in bold. Please see also any attending notes.    Marthenia Rolling, DO PGY-2, [facility] 02/20/2018 6:25 PM  FPTS Service pager: [phone] (text pages welcome through [facility])

## 2018-02-20 NOTE — Patient Instructions (Signed)
Patient directly admitted.

## 2018-02-20 NOTE — Progress Notes (Signed)
   CC: abdominal pain, night sweats   HPI  Abdominal pain - last time she was here she had night sweats. Had acute onset when walking in a restaurant originally. She notes she had this several years ago and was told it was viral gastro, it self resolved. Having diarrhea and sweats when here on 7/10 (had to go to the bathroom before she could get her labs drawn). Still felt bad on 7/11. Feels hot inside. Has been drinking water, but hasn't been able to eat due to abdominal pain. Constantly nauseous with pain, which has been constant. Has headache. Diarrhea got worse Friday. Been pacing the floor at night due to pain, called her pastor who called EMS this weekend. Headache pounding. No fever this time (had fever with episode in 2017).   Wt Readings from Last 3 Encounters:  02/20/18 248 lb 7.3 oz (112.7 kg)  02/20/18 248 lb 6.4 oz (112.7 kg)  02/01/18 257 lb 9.6 oz (116.8 kg)   Rash - healed up, was itchy.   ROS: Denies CP, endorses SOB, abdominal pain, denies dysuria, endorses changes in BMs.   CC, SH/smoking status, and VS noted  Objective: BP 134/82   Pulse (!) 101   Temp 98.7 F (37.1 C) (Oral)   Ht 5\' 1"  (1.549 m)   Wt 248 lb 6.4 oz (112.7 kg)   SpO2 94%   BMI 46.93 kg/m  Gen: NAD, alert, cooperative, and pleasant. Skin clammy.  HEENT: NCAT, EOMI, PERRL CV: RRR, no murmur Resp: CTAB, no wheezes, non-labored Abd: protuberant, TTP across all upper quadrants, normal BS.  Ext: No edema, warm Neuro: Alert and oriented, Speech clear, No gross deficits  Assessment and plan:  Abdominal pain: Presentation concerning for acute process, particularly in ability to take p.o. secondary to pain.  My differential includes undetected gallbladder disease (would require HIDA for evaluation given recent normal ultrasound), pancreatitis although lipase was recently normal, viral etiology, carcinoid syndrome.  Given headache, night sweats, diarrhea, concern for carcinoid relatively high.  Will  admit the patient for inability to tolerate p.o./pain control, as well as further imaging.  Discussed with inpatient team.  , MD, PGY3 02/20/2018 1:39 PM

## 2018-02-21 DIAGNOSIS — R1013 Epigastric pain: Secondary | ICD-10-CM | POA: Diagnosis not present

## 2018-02-21 LAB — BASIC METABOLIC PANEL
Anion gap: 9 (ref 5–15)
BUN: 6 mg/dL — ABNORMAL LOW (ref 8–23)
CALCIUM: 8.8 mg/dL — AB (ref 8.9–10.3)
CHLORIDE: 100 mmol/L (ref 98–111)
CO2: 25 mmol/L (ref 22–32)
Creatinine, Ser: 0.77 mg/dL (ref 0.44–1.00)
GFR calc Af Amer: >60 mL/min (ref >60)
GFR calc non Af Amer: >60 mL/min (ref >60)
Glucose, Bld: 113 mg/dL — ABNORMAL HIGH (ref 70–99)
POTASSIUM: 3.5 mmol/L (ref 3.5–5.1)
SODIUM: 134 mmol/L — AB (ref 135–145)

## 2018-02-21 LAB — CBC
HEMATOCRIT: 34.9 % — AB (ref 36.0–46.0)
Hemoglobin: 11.8 g/dL — ABNORMAL LOW (ref 12.0–15.0)
MCH: 33.2 pg (ref 26.0–34.0)
MCHC: 33.8 g/dL (ref 30.0–36.0)
MCV: 98.3 fL (ref 78.0–100.0)
Platelets: 282 10*3/uL (ref 150–400)
RBC: 3.55 MIL/uL — ABNORMAL LOW (ref 3.87–5.11)
RDW: 11.3 % — ABNORMAL LOW (ref 11.5–15.5)
WBC: 4.4 10*3/uL (ref 4.0–10.5)

## 2018-02-21 MED ORDER — SIMETHICONE 80 MG PO CHEW: 80.0000 mg | CHEWABLE_TABLET | Freq: Four times a day (QID) | ORAL | 0 refills | Status: DC | PRN

## 2018-02-21 MED ORDER — SIMETHICONE 80 MG PO CHEW
80.0000 mg | CHEWABLE_TABLET | Freq: Four times a day (QID) | ORAL | Status: DC | PRN
  Administered 2018-02-21: 80 mg via ORAL
  Filled 2018-02-21: qty 1

## 2018-02-21 NOTE — Plan of Care (Signed)

## 2018-02-21 NOTE — Progress Notes (Signed)
Nutrition Brief Note  Patient identified on the Malnutrition Screening Tool (MST) Report  Wt Readings from Last 15 Encounters:  02/20/18 248 lb 7.3 oz (112.7 kg)  02/20/18 248 lb 6.4 oz (112.7 kg)  02/01/18 257 lb 9.6 oz (116.8 kg)  01/02/18 256 lb 9.6 oz (116.4 kg)  12/01/17 257 lb (116.6 kg)  11/10/17 256 lb 6.4 oz (116.3 kg)  11/08/17 255 lb 3.2 oz (115.8 kg)  02/09/17 275 lb (124.7 kg)  06/21/16 260 lb (117.9 kg)  05/01/16 260 lb (117.9 kg)  09/15/15 260 lb (117.9 kg)   Michelle Rios is a 62 y.o. female presenting with 1 month history of abdominal pain, nausea, diarrhea, and diaphoresis . PMH is significant for sarcoidosis, HTN.  Pt admitted with nausea, vomiting, and abdominal pain.   Pt sleeping soundly at time of visit. She did not respond to voice.   Noted pt consumed 100% of Ensure supplement at bedside.   Reviewed wt hx; noted pt last experienced a 9.8% wt loss over the past year, which is not significant for time frame. Mild wt loss is favorable, given pt's obesity.   Nutrition-Focused physical exam completed. Findings are no fat depletion, no muscle depletion, and mild edema.   Per MD notes, likely discharge home later today.   Body mass index is 46.95 kg/m. Patient meets criteria for extreme obesity, class III based on current BMI.   Current diet order is regular, patient is consuming approximately n/a% of meals at this time. Labs and medications reviewed.   No nutrition interventions warranted at this time. If nutrition issues arise, please consult RD.   Michelle Rios, RD, LDN, CDE Pager: [phone] After hours Pager: [phone]

## 2018-02-21 NOTE — Progress Notes (Signed)
@  2000 Pt discharged to home accompanied by daughter. VSS. Skin intact. Pt discharged instructions given with no questions asked.

## 2018-02-22 ENCOUNTER — Institutional Professional Consult (permissible substitution): Payer: BLUE CROSS/BLUE SHIELD | Admitting: Internal Medicine

## 2018-02-23 ENCOUNTER — Telehealth: Payer: Self-pay | Admitting: Family Medicine

## 2018-02-23 NOTE — Discharge Summary (Signed)
Family Medicine Teaching [facility] Discharge Summary  Patient name: Michelle Rios Medical record number: [id number] Date of birth: 1955-12-15 Age: 62 y.o. Gender: female Date of Admission: 02/20/2018  Date of Discharge: 02/21/2018 Admitting Physician: Latrelle Dodrill, MD  Primary Care Provider: Garth Bigness, MD Consultants: None  Indication for Hospitalization: Abdominal pain, nausea, diarrhea, diaphoresis  Discharge Diagnoses/Problem List:  Abdominal pain, diarrhea, diaphoresis Hypertension Anemia Sarcoidosis  Disposition: Home  Discharge Condition: Stable  Discharge Exam:  General: 62 y.o. y.o. female in NAD Cardio: RRR, no m/r/g Lungs: CTAB, no wheezing, no rhonchi, no crackles Abdomen: Soft, mild epigastric tenderness, positive bowel sounds Skin: warm and dry Extremities: No edema   Brief Hospital Course:  Michelle Rios is a 62 y.o. female who was a direct admission from the family medicine clinic after presenting with 1 month history of abdominal pain, yellow watery diarrhea, episodes of diaphoresis.  She has had prior work-up as outpatient for the symptoms including HIV, QuantiFERON gold, TSH, blood cultures that were all negative.  She had presented to the ED 3 days prior to admission for the same symptoms.  An ultrasound of her abdomen had been performed that was negative.  Lipase, CMP, CBC, UA were all within normal limits.  At the time of presentation to the family medicine clinic, patient was experiencing 10/10 pain and said that she had not been able to eat a full meal in 3 days.  She was admitted to the hospital, BMP, CBC, LFTs were within normal limits at the time of admission.  A CT scan of the abdomen and pelvis showed only mild diverticulosis.  5-HIAA 24-hour collection was obtained.  Overnight, patient's pain resolved and she did not continue to have diarrhea.  The morning following her admission, hemoglobin had decreased to 11.8.  Patient  remained hemodynamically stable and asymptomatic.  She denied any hematuria, melena, hematochezia.  She noted that she had had a colonoscopy in the past but was unsure of when.  She denies dizziness upon standing.  FOBT was obtained and sent to the lab.  The decision was made to discharge the patient from the hospital due to resolution of symptoms and negative work-up.  Following discharge, the lab appears to have discontinued her FOBT.  She will need a repeat FOBT, GI consult, and possible colonoscopy.  At the time of discharge patient was asymptomatic and stable.  Due to resolution of symptoms, this was likely caused by a viral gastroenteritis.  Upon admission, differential diagnosis included carcinoid syndrome.  This is an unlikely diagnosis as patient's diarrhea resolved without treatment and there was no associated flushing with the patient's symptoms.  Will need follow-up 5-HIAA urine test.  On discharge, the patient's hydrochlorothiazide was also discontinued.  She stated that she was not supposed to be on this medication and had received it from the pharmacy in error.  Chart review supported this statement from the patient.  Issues for Follow Up:  1. Will need follow-up of 5-HIAA results. 2. Will need repeat FOBT, GI consult, possible colonoscopy. 3. Will need repeat CBC for follow-up of anemia.  Significant Procedures: None  Significant Labs and Imaging:  Recent Labs  Lab 02/20/18 1438 02/20/18 1606 02/21/18 0634  WBC 5.8 5.5 4.4  HGB 12.8 11.9* 11.8*  HCT 37.6 34.5* 34.9*  PLT 250 289 282   Recent Labs  Lab 02/17/18 1742 02/20/18 1606 02/21/18 0634  NA 139 141 134*  K 4.0 3.8 3.5  CL 104 106 100  CO2 22 28 25   GLUCOSE 122* 97 113*  BUN 12 8 6*  CREATININE 0.84 0.80 0.77  CALCIUM 9.4 9.3 8.8*  ALKPHOS 76 72  --   AST 21 18  --   ALT 13 13  --   ALBUMIN 4.0 3.8  --     Ct Abdomen Pelvis Wo Contrast  Result Date: 02/21/2018 CLINICAL DATA:  Upper abdominal pain and  nausea. Diarrhea. Symptoms for 3 weeks. EXAM: CT ABDOMEN AND PELVIS WITHOUT CONTRAST TECHNIQUE: Multidetector CT imaging of the abdomen and pelvis was performed following the standard protocol without IV contrast. COMPARISON:  Abdominal ultrasound yesterday. FINDINGS: Lower chest: Scattered subpleural ground-glass opacities in both lower lobes, likely secondary to scarring. No confluent consolidation. No pleural fluid. Upper normal heart size. Atherosclerosis of thoracic aorta. Hepatobiliary: Calcified granuloma in the posterior right lobe of the liver. No focal hepatic lesion on noncontrast exam. Gallbladder physiologically distended, no calcified stone. No biliary dilatation. Pancreas: No ductal dilatation or inflammation. Spleen: Normal in size without focal abnormality. Adrenals/Urinary Tract: Adrenal glands. No hydronephrosis. No perinephric edema. No urolithiasis. Urinary bladder is nondistended, no bladder wall thickening. Stomach/Bowel: Stomach is within normal limits. No evidence of bowel wall thickening, distention, or inflammatory changes. Formed stool in the colon. No colonic inflammation. Minimal diverticulosis of the descending colon without diverticulitis. Cecum located in the midline pelvis, normal appendix at the midline. Vascular/Lymphatic: Aortic atherosclerosis without aneurysm. No abdominopelvic adenopathy. Reproductive: Post hysterectomy. Ovaries are quiescent. Incidental phleboliths in the right ovarian vein. Other: No free air, free fluid, or intra-abdominal fluid collection. Minimal air in the lower anterior abdominal wall subcutaneous fat in a pattern consistent with subcutaneous injections. Musculoskeletal: Degenerative change in the spine with large anterior osteophytes in the lower thoracic spine and facet hypertrophy the lower lumbar spine. There are no acute or suspicious osseous abnormalities. IMPRESSION: 1. No acute findings or explanation for abdominal pain. 2. Mild colonic  diverticulosis without diverticulitis. 3.  Aortic Atherosclerosis (ICD10-I70.0). Electronically Signed   By: 02/23/2018 M.D.   On: 02/21/2018 00:25   Results/Tests Pending at Time of Discharge: 5-HIAA  Discharge Medications:  Allergies as of 02/21/2018      Reactions   Ivp Dye [iodinated Diagnostic Agents] Anaphylaxis, Swelling      Medication List    STOP taking these medications   hydrochlorothiazide 12.5 MG tablet Commonly known as:  HYDRODIURIL   loperamide 2 MG capsule Commonly known as:  IMODIUM   ondansetron 4 MG disintegrating tablet Commonly known as:  ZOFRAN ODT     TAKE these medications   albuterol 108 (90 Base) MCG/ACT inhaler Commonly known as:  PROVENTIL HFA;VENTOLIN HFA Inhale 2 puffs into the lungs every 6 (six) hours as needed for wheezing or shortness of breath.   indapamide 2.5 MG tablet Commonly known as:  LOZOL Take 1 tablet (2.5 mg total) by mouth daily. What changed:  how much to take   simethicone 80 MG chewable tablet Commonly known as:  MYLICON Chew 1 tablet (80 mg total) by mouth 4 (four) times daily as needed for flatulence.       Discharge Instructions: Please refer to Patient Instructions section of EMR for full details.  Patient was counseled important signs and symptoms that should prompt return to medical care, changes in medications, dietary instructions, activity restrictions, and follow up appointments.   Follow-Up Appointments:  Patient will need to call her PCP to schedule an appointment in 1-2 weeks.  Meccariello, 02/23/2018, DO 02/23/2018, 10:54  AM PGY-1, [place]

## 2018-02-23 NOTE — Telephone Encounter (Signed)
Called patient to let her know that her Hospital F/U appointment has been made for 8/5 at 1:55pm with Dr. Georga Hacking, D.O.  PGY-1 Family Medicine  02/23/2018 4:21 PM

## 2018-02-24 LAB — 5 HIAA, QUANTITATIVE, URINE, 24 HOUR
5 HIAA UR: 2.6 mg/L
5-HIAA,QUANT.,24 HR URINE: 2.9 mg/(24.h) (ref 0.0–14.9)
Total Volume: 1100

## 2018-02-26 NOTE — Progress Notes (Signed)
Subjective:    Patient ID: Michelle Rios, female    DOB: [birth year], 62 y.o.   MRN: [id number]   CC: hosp f/u for viral gastroenteritis  HPI: Hospital follow up for viral gastroenteritis Patient admitted on 7/29 and discharged on 7/30 after having abdominal pain and diarrhea.  Patient was directly admitted from clinic for concerns regarding abdominal pain and diarrhea. While admitted patient's workup was negative (including BMP, CBC, LFTs) and symptoms resolved. CT scan of abdomen and pelvis showing only mild diverticulosis. Patient was discharged with diagnosis of viral gastroenteritis. 5-HIAA which resulted after discharge was normal.   Today reports that all symptoms have resolved.  She says she feels "a lot better".  Denies any diarrhea.  Denies any abdominal pain today.  Says she did have some abdominal pain on Saturday but not resolved.  Patient denies any blood in stools, emesis, or urine.  Patient denies any dizziness or lightheadedness.  Patient denies any chest pain or palpitations.  Patient denies any shortness of breath.  Patient does report a headache today but says this is constant.  States she used to be on medications but can no longer afford them due to to insurance reasons.  Issues for Follow Up recommended by hospital primary team:  1. Will need follow-up of 5-HIAA results. 2. Will need repeat FOBT, GI consult, possible colonoscopy. 3. Will need repeat CBC for follow-up of anemia.  Objective:  BP (!) 144/82   Pulse 89   Temp 98.9 F (37.2 C) (Oral)   Ht 5\' 1"  (1.549 m)   Wt 248 lb 9.6 oz (112.8 kg)   SpO2 97%   BMI 46.97 kg/m  Vitals and nursing note reviewed  General: well nourished, in no acute distress HEENT: normocephalic, PERRL, moist mucous membranes, good dentition without erythema or discharge noted in posterior oropharynx Neck: supple, non-tender, without lymphadenopathy Cardiac: RRR, clear S1 and S2, no murmurs, rubs, or gallops Respiratory: clear  to auscultation bilaterally, no increased work of breathing Abdomen: soft, nontender, nondistended, no masses or organomegaly. Bowel sounds present Extremities: no edema or cyanosis. Warm, well perfused. 2+ radial pulses bilaterally Skin: warm and dry, no rashes noted Neuro: alert and oriented, no focal deficits Rectal exam: negative without mass, lesions or tenderness, stool guaiac negative.   Assessment & Plan:    Abdominal pain Abdominal pain has resolved.  Given that patient is no longer symptomatic will not need further work-up.  Work-up in hospital including BMP, CBC, LFTs, and abdominal CT have all been negative which is reassuring.  Have instructed patient that if symptoms reappear that she should follow-up. -Follow-up as needed  Anemia While admitted patient found to have drop in hemoglobin to 11.8 from 12.8 the previous day.  Given this 1 point drop in hemoglobin it was recommended that patient have work-up for possible GI bleed.  Patient denies any blood in stools or emesis.  Denies any hematuria.  Patient reports that she has a history of anemia in the past.  Given recommendations by primary hospital team have repeated FOBT in clinic.  Hemoccult was negative and patient aware.  Have placed GI consultation for follow-up.  Have repeated CBC today to ensure no further drop in hemoglobin.  Patient today denies any dizziness or lightheadedness.  Denies any chest pain or palpitations.  Denies any shortness of breath. -Follow-up as needed  Essential hypertension Patient with elevated blood pressure today.  Per patient she has not been able to afford any of her medications.  Also under a lot of stress today.  Recommended that she follow-up with her PCP within a week to recheck blood pressures and then consider medical management at that point.    Return in about 1 week (around 03/06/2018), or if symptoms worsen or fail to improve, for Hypertension management with PCP.   Oralia Manis,  DO, PGY-2

## 2018-02-27 ENCOUNTER — Other Ambulatory Visit: Payer: Self-pay

## 2018-02-27 ENCOUNTER — Encounter: Payer: Self-pay | Admitting: Family Medicine

## 2018-02-27 ENCOUNTER — Ambulatory Visit (INDEPENDENT_AMBULATORY_CARE_PROVIDER_SITE_OTHER): Payer: BLUE CROSS/BLUE SHIELD | Admitting: Family Medicine

## 2018-02-27 VITALS — BP 144/82 | HR 89 | Temp 98.9°F | Ht 61.0 in | Wt 248.6 lb

## 2018-02-27 DIAGNOSIS — I10 Essential (primary) hypertension: Secondary | ICD-10-CM

## 2018-02-27 DIAGNOSIS — R1084 Generalized abdominal pain: Secondary | ICD-10-CM

## 2018-02-27 DIAGNOSIS — D649 Anemia, unspecified: Secondary | ICD-10-CM | POA: Diagnosis not present

## 2018-02-27 LAB — HEMOCCULT GUIAC POC 1CARD (OFFICE): Fecal Occult Blood, POC: NEGATIVE

## 2018-02-27 NOTE — Assessment & Plan Note (Signed)
While admitted patient found to have drop in hemoglobin to 11.8 from 12.8 the previous day.  Given this 1 point drop in hemoglobin it was recommended that patient have work-up for possible GI bleed.  Patient denies any blood in stools or emesis.  Denies any hematuria.  Patient reports that she has a history of anemia in the past.  Given recommendations by primary hospital team have repeated FOBT in clinic.  Hemoccult was negative and patient aware.  Have placed GI consultation for follow-up.  Have repeated CBC today to ensure no further drop in hemoglobin.  Patient today denies any dizziness or lightheadedness.  Denies any chest pain or palpitations.  Denies any shortness of breath. -Follow-up as needed

## 2018-02-27 NOTE — Assessment & Plan Note (Signed)
Abdominal pain has resolved.  Given that patient is no longer symptomatic will not need further work-up.  Work-up in hospital including BMP, CBC, LFTs, and abdominal CT have all been negative which is reassuring.  Have instructed patient that if symptoms reappear that she should follow-up. -Follow-up as needed

## 2018-02-27 NOTE — Patient Instructions (Addendum)
It was a pleasure seeing you today.   Today we discussed her hospital follow-up  For your follow-up: I am so glad to hear you are doing better!  If you have any blood in your stools, vomiting, or urine please follow-up.  If you have recurrent abdominal pain or diarrhea please follow-up.  Today I will obtain a test to see if there is blood in your stool as well as a CBC to check your blood counts.  I will either call or send a letter with these results.  The urine test you had in the hospital was negative.  Please follow up if becoming symptomatic or sooner if symptoms persist or worsen. Please call the clinic immediately if you have any concerns.   Our clinic's number is [phone]. Please call with questions or concerns.   I recommend you follow-up in 1 to 2 weeks for blood pressure.  I know you are not on any medications but your blood pressure seems to be elevated.  Please go to the emergency room if you have frank blood in your stool, vomit or urine  Thank you,  Oralia Manis, DO

## 2018-02-27 NOTE — Assessment & Plan Note (Signed)
Patient with elevated blood pressure today.  Per patient she has not been able to afford any of her medications.  Also under a lot of stress today.  Recommended that she follow-up with her PCP within a week to recheck blood pressures and then consider medical management at that point.

## 2018-02-28 ENCOUNTER — Telehealth: Payer: Self-pay

## 2018-02-28 ENCOUNTER — Telehealth: Payer: Self-pay | Admitting: *Deleted

## 2018-02-28 LAB — CBC
Hematocrit: 35.1 % (ref 34.0–46.6)
Hemoglobin: 12.1 g/dL (ref 11.1–15.9)
MCH: 33.2 pg — AB (ref 26.6–33.0)
MCHC: 34.5 g/dL (ref 31.5–35.7)
MCV: 96 fL (ref 79–97)
Platelets: 340 10*3/uL (ref 150–450)
RBC: 3.65 x10E6/uL — ABNORMAL LOW (ref 3.77–5.28)
RDW: 12.9 % (ref 12.3–15.4)
WBC: 5.6 10*3/uL (ref 3.4–10.8)

## 2018-02-28 NOTE — Telephone Encounter (Signed)
-----   Message from Oralia Manis, DO sent at 02/28/2018  8:57 AM EDT ----- Please inform patient that her hemoglobin has improved to 12.1 which is in the normal range

## 2018-02-28 NOTE — Telephone Encounter (Signed)
Returned pts phone call about recent lab work. Informed pt her hemoglobin is within normal range. Pt was appreciative of the call and had no further questions.

## 2018-02-28 NOTE — Telephone Encounter (Signed)
Pt informed. Denny Lave, CMA  

## 2018-03-01 ENCOUNTER — Encounter: Payer: Self-pay | Admitting: Gastroenterology

## 2018-03-20 ENCOUNTER — Ambulatory Visit (INDEPENDENT_AMBULATORY_CARE_PROVIDER_SITE_OTHER)
Admission: RE | Admit: 2018-03-20 | Discharge: 2018-03-20 | Disposition: A | Discharge status: Home / Self Care | Payer: BLUE CROSS/BLUE SHIELD | Source: Ambulatory Visit | Attending: Internal Medicine | Admitting: Internal Medicine

## 2018-03-20 ENCOUNTER — Encounter: Payer: Self-pay | Admitting: Internal Medicine

## 2018-03-20 ENCOUNTER — Ambulatory Visit (INDEPENDENT_AMBULATORY_CARE_PROVIDER_SITE_OTHER): Payer: BLUE CROSS/BLUE SHIELD | Admitting: Internal Medicine

## 2018-03-20 VITALS — BP 128/76 | HR 101 | Ht 60.0 in | Wt 249.0 lb

## 2018-03-20 DIAGNOSIS — D869 Sarcoidosis, unspecified: Secondary | ICD-10-CM

## 2018-03-20 DIAGNOSIS — R0602 Shortness of breath: Secondary | ICD-10-CM

## 2018-03-20 NOTE — Patient Instructions (Addendum)
Please remember to go to the  x-ray department downstairs in the basement  for your tests - we will call you with the results when they are available.     There is no evidence of sarcoid or any other lung problem at this point   Only use your albuterol as a rescue medication to be used if you can't catch your breath by resting or doing a relaxed purse lip breathing pattern.  - The less you use it, the better it will work when you need it. - Ok to use up to 2 puffs  every 4 hours if you must but call for immediate appointment if use goes up over your usual need - Don't leave home without it !!  (think of it like the spare tire for your car)    Pulmonary follow up is as needed

## 2018-03-20 NOTE — Progress Notes (Signed)
Mack Guise, female    DOB: [birth year], 62 y.o.   MRN: [id number]    Brief patient profile:  89 yobf  With morbid obesity remote sarcoid/ with  last previous resp problems sometime in 2017 then "fine for sev years" with onset variable sob since March 2019 with >>> admit    Date of Admission: 02/20/2018                      Date of Discharge: 02/21/2018   Discharge Diagnoses/Problem List:  Abdominal pain, diarrhea, diaphoresis Hypertension Anemia Sarcoidosis   Brief Hospital Course:  KEAUNNA SKIPPER is a 62 y.o. female who was a direct admission from the family medicine clinic after presenting with 1 month history of abdominal pain, yellow watery diarrhea, episodes of diaphoresis.  She has had prior work-up as outpatient for the symptoms including HIV, QuantiFERON gold, TSH, blood cultures that were all negative.  She had presented to the ED 3 days prior to admission for the same symptoms.  An ultrasound of her abdomen had been performed that was negative.  Lipase, CMP, CBC, UA were all within normal limits.  At the time of presentation to the family medicine clinic, patient was experiencing 10/10 pain and said that she had not been able to eat a full meal in 3 days.  She was admitted to the hospital, BMP, CBC, LFTs were within normal limits at the time of admission.  A CT scan of the abdomen and pelvis showed only mild diverticulosis.  5-HIAA 24-hour collection was obtained.  Overnight, patient's pain resolved and she did not continue to have diarrhea.  The morning following her admission, hemoglobin had decreased to 11.8.  Patient remained hemodynamically stable and asymptomatic.  She denied any hematuria, melena, hematochezia.  She noted that she had had a colonoscopy in the past but was unsure of when.  She denies dizziness upon standing.  FOBT was obtained and sent to the lab.  The decision was made to discharge the patient from the hospital due to resolution of symptoms and negative  work-up.  Following discharge, the lab appears to have discontinued her FOBT.  She will need a repeat FOBT, GI consult, and possible colonoscopy.  At the time of discharge patient was asymptomatic and stable.  Due to resolution of symptoms, this was likely caused by a viral gastroenteritis.  Upon admission, differential diagnosis included carcinoid syndrome.  This is an unlikely diagnosis as patient's diarrhea resolved without treatment and there was no associated flushing with the patient's symptoms.  Will need follow-up 5-HIAA urine test.  On discharge, the patient's hydrochlorothiazide was also discontinued.  She stated that she was not supposed to be on this medication and had received it from the pharmacy in error.  Chart review supported this statement from the patient.  Issues for Follow Up:  1. Will need follow-up of 5-HIAA results. 2. Will need repeat FOBT, GI consult, possible colonoscopy. 3. Will need repeat CBC for follow-up of anemia.     03/20/2018  Pulmonary consultation  Chief Complaint  Patient presents with  . Consult    Referred by Dr. Abelardo Diesel due to sarcoidosis, cough, and a rash that began 4 months ago. Pt states she was also hospitalized.  Dyspnea:  50 ft  Most days but quite variable/ no assoc cp  Cough: some cough in am wakes up with and also before suppoer, keeping her up some nocts Sleep: 30 degrees x years  SABA use: 1-2  x daily, not sure they help     No obvious pattern in day to day or daytime variability or assoc excess/ purulent sputum or mucus plugs or hemoptysis or   chest tightness, subjective wheeze or overt sinus or hb symptoms.   Sleeping as above  without nocturnal  or early am exacerbation  of respiratory  c/o's or need for noct saba. Also denies any obvious fluctuation of symptoms with weather or environmental changes or other aggravating or alleviating factors except as outlined above   No unusual exposure hx or h/o childhood pna/ asthma or  knowledge of premature birth.  Current Allergies, Complete Past Medical History, Past Surgical History, Family History, and Social History were reviewed in [employer] record.  ROS  The following are not active complaints unless bolded Hoarseness, sore throat, dysphagia, dental problems, itching, sneezing,  nasal congestion or discharge of excess mucus or purulent secretions, ear ache,   fever, chills, sweats, unintended wt loss or wt gain, classically pleuritic or exertional cp,  orthopnea pnd or arm/hand swelling  or leg swelling, presyncope, palpitations, abdominal pain, anorexia, nausea, vomiting, diarrhea  or change in bowel habits or change in bladder habits, change in stools or change in urine, dysuria, hematuria,  Rash, iching R forearm only , arthralgias, visual complaints, headache, numbness, weakness or ataxia or problems with walking or coordination,  change in mood or  memory.                 Past Medical History:  Diagnosis Date  . AKI (acute kidney injury) (HCC) 09/15/2015  . Anemia   . Arthritis    "knees" (02/20/2018)  . Asthma   . Daily headache   . Diarrhea 09/15/2015  . History of blood transfusion 1979   w/childbirth  . Hypertension   . Pulmonary embolism (HCC) 1990s X 1  . Sarcoidosis of lung [facility])     Outpatient Medications Prior to Visit  Medication Sig Dispense Refill  . albuterol (PROVENTIL HFA;VENTOLIN HFA) 108 (90 Base) MCG/ACT inhaler Inhale 2 puffs into the lungs every 6 (six) hours as needed for wheezing or shortness of breath. (Patient not taking: Reported on 02/01/2018) 1 Inhaler 0  . indapamide (LOZOL) 2.5 MG tablet Take 1 tablet (2.5 mg total) by mouth daily. (Patient taking differently: Take 1.25 mg by mouth daily. ) 90 tablet 1  . simethicone (MYLICON) 80 MG chewable tablet Chew 1 tablet (80 mg total) by mouth 4 (four) times daily as needed for flatulence. 30 tablet 0   No facility-administered medications prior to visit.              Objective:     BP 128/76 (BP Location: Right Wrist, Cuff Size: Normal)   Pulse (!) 101   Ht 5' (1.524 m)   Wt 249 lb (112.9 kg)   SpO2 100%   BMI 48.63 kg/m   SpO2: 100 %  RA  Wt Readings from Last 3 Encounters:  03/20/18 249 lb (112.9 kg)  02/27/18 248 lb 9.6 oz (112.8 kg)  02/20/18 248 lb 7.3 oz (112.7 kg)        Obese amb anxious  bf jumping from complaint to complaint s finishing answering questions about the prior complaint  HEENT: Poor dentition, turbinates bilaterally, and oropharynx. Nl external ear canals without cough reflex   NECK :  without JVD/Nodes/TM/ nl carotid upstrokes bilaterally   LUNGS: no acc muscle use,  Nl contour chest which is clear to A and P  bilaterally x for minimal pseudowheeze  without cough on insp or exp maneuvers  CV:  RRR  no s3 or murmur or increase in P2, and no edema   ABD:  Massively obese soft and nontender with very limited inspiratory excursion in the supine position. No bruits or organomegaly appreciated, bowel sounds nl  MS:  Nl gait/ ext warm without deformities, calf tenderness, cyanosis or clubbing No obvious joint restrictions   SKIN: warm and dry - maculopapular min hypopigmented rash over L forearm only,  No EN lesions or purlplish papules  NEURO:  alert, approp, nl sensorium with  no motor or cerebellar deficits apparent.      CXR PA and Lateral:   03/20/2018 :    I personally reviewed images and   impression as follows:    min chronic adenopathy/ no ild or acute changes, lots of gas in abd   Labs   reviewed:      Chemistry      Component Value Date/Time   NA 134 (L) 02/21/2018 0634   NA 141 12/13/2017 1358   K 3.5 02/21/2018 0634   CL 100 02/21/2018 0634   CO2 25 02/21/2018 0634   BUN 6 (L) 02/21/2018 0634   BUN 14 12/13/2017 1358   CREATININE 0.77 02/21/2018 0634      Component Value Date/Time   CALCIUM 8.8 (L) 02/21/2018 0634   ALKPHOS 72 02/20/2018 1606   AST 18 02/20/2018 1606    ALT 13 02/20/2018 1606   BILITOT 0.7 02/20/2018 1606   BILITOT 0.3 12/01/2017 1611    Protein  7.2/  Alb 3.8                        12/01/17        Lab Results  Component Value Date   WBC 5.6 02/27/2018   HGB 12.1 02/27/2018   HCT 35.1 02/27/2018   MCV 96 02/27/2018   PLT 340 02/27/2018        Lab Results  Component Value Date   TSH 2.330 02/02/2018             Assessment   Sarcoidosis A good rule of thumb is that >95% of pts with active sarcoid in any organ will have some plain cxr changes - on the other hand  if there are active pulmonary symptoms the cxr will look much worse than the patient:  No evidence of either scenario here/ strongly doubt active dz    SOB (shortness of breath) 03/20/2018   Walked RA x one lap @ 185 stopped due to  Dizzy, knees hurt min sob/ no desats at slow pace  - Spirometry 03/20/2018  Very poor tracings with f/v loop typical of vcd, not true airflow obst    Symptoms are markedly disproportionate to objective findings and not clear to what extent this is actually a pulmonary  problem but pt does appear to have difficult to sort out respiratory symptoms of unknown origin for which  DDX  = almost all start with A and  include Adherence, Ace Inhibitors, Acid Reflux, Active Sinus Disease, Alpha 1 Antitripsin deficiency, Anxiety masquerading as Airways dz,  ABPA,  Allergy(esp in young), Aspiration (esp in elderly), Adverse effects of meds,  Active smokers, A bunch of PE's/clot burden (a few small clots can't cause this syndrome unless there is already severe underlying pulm or vascular dz with poor reserve),  Anemia or thyroid disorder, plus two Bs  = Bronchiectasis and  Beta blocker use..and one C= CHF    Adherence is always the initial "prime suspect" and is a multilayered concern that requires a "trust but verify" approach in every patient - starting with knowing how to use medications, especially inhalers, correctly, keeping up with refills and  understanding the fundamental difference between maintenance and prns vs those medications only taken for a very short course and then stopped and not refilled.  - would def do a trust but verify approach here to med reconciliation and asked pt to return for all further ov's with all meds in hand using a trust but verify approach to confirm accurate Medication  Reconciliation The principal here is that until we are certain that the  patients are doing what we've asked, it makes no sense to ask them to do more.    ? Allergy / asthma > nothing to suggest this dx but ok to have saba on hand prn > usual restrictions reviewed - - The proper method of use, as well as anticipated side effects, of a metered-dose inhaler are discussed and demonstrated to the patient. Rule of two's reviewed.  Distinction between doe from obesity/ deconditioning and asthma reviewed also   ? Anxiety > usually at the bottom of this list of usual suspects but should be much higher on this pt's based on H and P and may interfere with adherence and also interpretation of response or lack thereof to symptom management which can be quite subjective.   Anemia/ thyroid dz > ruled out   ? chf > no evidence for this    I think her abd compartment is also a major source of her sense of sob both related to wt and retained gas based on her her cxr and exam today > f/u planned     Severe obesity (BMI >= 40) (HCC) Body mass index is 48.63 kg/m.    Lab Results  Component Value Date   TSH 2.330 02/02/2018     Contributing to gerd risk/ doe/reviewed the need and the process to achieve and maintain neg calorie balance > defer f/u primary care including intermittently monitoring thyroid status         Total time devoted to counseling  > 50 % of initial 60 min office visit:  review case with pt/ discussion of options/alternatives/ personally creating written customized instructions  in presence of pt  then going over those specific   Instructions directly with the pt including how to use all of the meds but in particular covering each new medication in detail and the difference between the maintenance= "automatic" meds and the prns using an action plan format for the latter (If this problem/symptom => do that organization reading Left to right).  Please see AVS from this visit for a full list of these instructions which I personally wrote for this pt and  are unique to this visit.   See device teaching which extended face to face time for this visit      Sandrea Hughs, MD 03/20/2018

## 2018-03-21 ENCOUNTER — Encounter: Payer: Self-pay | Admitting: Internal Medicine

## 2018-03-21 ENCOUNTER — Telehealth: Payer: Self-pay | Admitting: Internal Medicine

## 2018-03-21 NOTE — Assessment & Plan Note (Signed)
Body mass index is 48.63 kg/m.    Lab Results  Component Value Date   TSH 2.330 02/02/2018     Contributing to gerd risk/ doe/reviewed the need and the process to achieve and maintain neg calorie balance > defer f/u primary care including intermittently monitoring thyroid status         Total time devoted to counseling  > 50 % of initial 60 min office visit:  review case with pt/ discussion of options/alternatives/ personally creating written customized instructions  in presence of pt  then going over those specific  Instructions directly with the pt including how to use all of the meds but in particular covering each new medication in detail and the difference between the maintenance= "automatic" meds and the prns using an action plan format for the latter (If this problem/symptom => do that organization reading Left to right).  Please see AVS from this visit for a full list of these instructions which I personally wrote for this pt and  are unique to this visit.   See device teaching which extended face to face time for this visit

## 2018-03-21 NOTE — Assessment & Plan Note (Signed)
03/20/2018   Walked RA x one lap @ 185 stopped due to  Dizzy, knees hurt min sob/ no desats at slow pace  - Spirometry 03/20/2018  Very poor tracings with f/v loop typical of vcd, not true airflow obst    Symptoms are markedly disproportionate to objective findings and not clear to what extent this is actually a pulmonary  problem but pt does appear to have difficult to sort out respiratory symptoms of unknown origin for which  DDX  = almost all start with A and  include Adherence, Ace Inhibitors, Acid Reflux, Active Sinus Disease, Alpha 1 Antitripsin deficiency, Anxiety masquerading as Airways dz,  ABPA,  Allergy(esp in young), Aspiration (esp in elderly), Adverse effects of meds,  Active smokers, A bunch of PE's/clot burden (a few small clots can't cause this syndrome unless there is already severe underlying pulm or vascular dz with poor reserve),  Anemia or thyroid disorder, plus two Bs  = Bronchiectasis and Beta blocker use..and one C= CHF    Adherence is always the initial "prime suspect" and is a multilayered concern that requires a "trust but verify" approach in every patient - starting with knowing how to use medications, especially inhalers, correctly, keeping up with refills and understanding the fundamental difference between maintenance and prns vs those medications only taken for a very short course and then stopped and not refilled.  - would def do a trust but verify approach here to med reconciliation and asked pt to return for all further ov's with all meds in hand using a trust but verify approach to confirm accurate Medication  Reconciliation The principal here is that until we are certain that the  patients are doing what we've asked, it makes no sense to ask them to do more.    ? Allergy / asthma > nothing to suggest this dx but ok to have saba on hand prn > usual restrictions reviewed - - The proper method of use, as well as anticipated side effects, of a metered-dose inhaler are  discussed and demonstrated to the patient. Rule of two's reviewed.  Distinction between doe from obesity/ deconditioning and asthma reviewed also   ? Anxiety > usually at the bottom of this list of usual suspects but should be much higher on this pt's based on H and P and may interfere with adherence and also interpretation of response or lack thereof to symptom management which can be quite subjective.   Anemia/ thyroid dz > ruled out   ? chf > no evidence for this    I think her abd compartment is also a major source of her sense of sob both related to wt and retained gas based on her her cxr and exam today > f/u planned

## 2018-03-21 NOTE — Assessment & Plan Note (Signed)
A good rule of thumb is that >95% of pts with active sarcoid in any organ will have some plain cxr changes - on the other hand  if there are active pulmonary symptoms the cxr will look much worse than the patient:  No evidence of either scenario here/ strongly doubt active dz   

## 2018-03-21 NOTE — Telephone Encounter (Signed)
Called patient, made aware of results per MD Wert. Voiced understanding. Nothing further needed at this time.

## 2018-03-28 ENCOUNTER — Encounter (INDEPENDENT_AMBULATORY_CARE_PROVIDER_SITE_OTHER): Payer: Self-pay

## 2018-04-01 ENCOUNTER — Ambulatory Visit (HOSPITAL_COMMUNITY)
Admission: EM | Admit: 2018-04-01 | Discharge: 2018-04-01 | Disposition: A | Discharge status: Home / Self Care | Payer: BLUE CROSS/BLUE SHIELD | Attending: Internal Medicine | Admitting: Internal Medicine

## 2018-04-01 ENCOUNTER — Ambulatory Visit (INDEPENDENT_AMBULATORY_CARE_PROVIDER_SITE_OTHER): Payer: BLUE CROSS/BLUE SHIELD

## 2018-04-01 ENCOUNTER — Encounter (HOSPITAL_COMMUNITY): Payer: Self-pay | Admitting: *Deleted

## 2018-04-01 ENCOUNTER — Other Ambulatory Visit: Payer: Self-pay

## 2018-04-01 DIAGNOSIS — S7001XA Contusion of right hip, initial encounter: Secondary | ICD-10-CM

## 2018-04-01 DIAGNOSIS — M25559 Pain in unspecified hip: Secondary | ICD-10-CM

## 2018-04-01 DIAGNOSIS — M25571 Pain in right ankle and joints of right foot: Secondary | ICD-10-CM

## 2018-04-01 DIAGNOSIS — S93401A Sprain of unspecified ligament of right ankle, initial encounter: Secondary | ICD-10-CM | POA: Diagnosis not present

## 2018-04-01 DIAGNOSIS — W19XXXA Unspecified fall, initial encounter: Secondary | ICD-10-CM

## 2018-04-01 MED ORDER — IBUPROFEN 600 MG PO TABS: 600.0000 mg | ORAL_TABLET | Freq: Four times a day (QID) | ORAL | 0 refills | Status: DC | PRN

## 2018-04-01 MED ORDER — KETOROLAC TROMETHAMINE 60 MG/2ML IM SOLN
INTRAMUSCULAR | Status: AC
  Filled 2018-04-01: qty 2

## 2018-04-01 MED ORDER — KETOROLAC TROMETHAMINE 60 MG/2ML IM SOLN
60.0000 mg | Freq: Once | INTRAMUSCULAR | Status: AC
  Administered 2018-04-01: 60 mg via INTRAMUSCULAR

## 2018-04-01 NOTE — Discharge Instructions (Signed)
We gave you a shot of toradol today Use anti-inflammatories for pain/swelling. You may take up to 600-800 mg Ibuprofen every 8 hours with food. You may supplement Ibuprofen with Tylenol [phone] mg every 8 hours.  Please ice these areas on your hip, knee and ankle. Please wear ankle brace for support over the next 1 to 2 weeks I expect pain to gradually improve over the next 1 to 2 weeks  Please follow-up if pain worsening, not improving, developing new symptoms, loss of bowel or bladder control, numbness or tingling, weakness, persistent symptoms.

## 2018-04-01 NOTE — ED Provider Notes (Signed)
[facility]    CSN: [id number] Arrival date & time: 04/01/18  1216     History   Chief Complaint Chief Complaint  Patient presents with  . Fall    HPI BRYANNE RIQUELME is a 62 y.o. female history of hypertension, hyperlipidemia presenting today for evaluation of right lower extremity pain secondary to fall.  Patient states that she was at the nail salon 2 to 3 days ago, she bent over as she went to sit down, her ankle rolled and she landed on her right side.  Denies hitting head or loss of consciousness.  Denies any dizziness, vision changes or nausea or vomiting.  She needed help with standing back up, and since has had pain in her left gluteal area, knee and ankle.  Pain with weightbearing, pain with sitting.  Denies numbness or tingling.  Denies loss of bowel or bladder control.  HPI  Past Medical History:  Diagnosis Date  . AKI (acute kidney injury) (HCC) 09/15/2015  . Anemia   . Arthritis    "knees" (02/20/2018)  . Asthma   . Daily headache   . Diarrhea 09/15/2015  . History of blood transfusion 1979   w/childbirth  . Hypertension   . Pulmonary embolism (HCC) 1990s X 1  . Sarcoidosis of lung [facility])     Patient Active Problem List   Diagnosis Date Noted  . Abdominal pain 02/20/2018  . Diarrhea 02/20/2018  . Unexplained night sweats 02/01/2018  . Rash 02/01/2018  . Anemia 12/06/2017  . HLD (hyperlipidemia) 12/06/2017  . Acute reaction to stress 11/12/2017  . Essential hypertension 02/09/2017  . Severe obesity (BMI >= 40) (HCC) 02/09/2017  . Sarcoidosis 02/09/2017  . Right upper quadrant abdominal tenderness   . SOB (shortness of breath)     Past Surgical History:  Procedure Laterality Date  . FRACTURE SURGERY    . KNEE ARTHROSCOPY Right 1990s  . TUBAL LIGATION  1990s  . VAGINAL HYSTERECTOMY  1990s  . WRIST FRACTURE SURGERY Left 2001    OB History   None      Home Medications    Prior to Admission medications   Medication Sig Start Date  End Date Taking? Authorizing Provider  simethicone (MYLICON) 80 MG chewable tablet Chew 1 tablet (80 mg total) by mouth 4 (four) times daily as needed for flatulence. 02/21/18  Yes Talbert Forest, [place], DO  ibuprofen (ADVIL,MOTRIN) 600 MG tablet Take 1 tablet (600 mg total) by mouth every 6 (six) hours as needed. 04/01/18   Shaquill Iseman, Junius Creamer, PA-C    Family History Family History  Problem Relation Age of Onset  . Heart attack Father   . Heart disease Mother   . Diabetes Mother   . Stroke Paternal Grandmother   . Breast cancer Sister     Social History Social History   Tobacco Use  . Smoking status: Never Smoker  . Smokeless tobacco: Never Used  Substance Use Topics  . Alcohol use: No  . Drug use: No     Allergies   Ivp dye [iodinated diagnostic agents]   Review of Systems Review of Systems  Constitutional: Negative for activity change, chills, diaphoresis and fatigue.  HENT: Negative for ear pain, tinnitus and trouble swallowing.   Eyes: Negative for photophobia and visual disturbance.  Respiratory: Negative for cough, chest tightness and shortness of breath.   Cardiovascular: Negative for chest pain and leg swelling.  Gastrointestinal: Negative for abdominal pain, blood in stool, nausea and vomiting.  Musculoskeletal: Positive  for arthralgias, gait problem and myalgias. Negative for back pain, neck pain and neck stiffness.  Skin: Positive for color change. Negative for wound.  Neurological: Negative for dizziness, weakness, light-headedness, numbness and headaches.     Physical Exam Triage Vital Signs ED Triage Vitals  Enc Vitals Group     BP 04/01/18 1313 (!) 135/96     Pulse Rate 04/01/18 1313 86     Resp 04/01/18 1313 18     Temp 04/01/18 1313 98 F (36.7 C)     Temp Source 04/01/18 1313 Oral     SpO2 04/01/18 1313 98 %     Weight --      Height 04/01/18 1318 5' (1.524 m)     Head Circumference --      Peak Flow --      Pain Score 04/01/18 1315 9     Pain Loc  --      Pain Edu? --      Excl. in GC? --    No data found.  Updated Vital Signs BP (!) 135/96   Pulse 86   Temp 98 F (36.7 C) (Oral)   Resp 18   Ht 5' (1.524 m)   SpO2 98%   BMI 48.63 kg/m   Visual Acuity Right Eye Distance:   Left Eye Distance:   Bilateral Distance:    Right Eye Near:   Left Eye Near:    Bilateral Near:     Physical Exam  Constitutional: She is oriented to person, place, and time. She appears well-developed and well-nourished.  No acute distress  HENT:  Head: Normocephalic and atraumatic.  Nose: Nose normal.  Eyes: Conjunctivae are normal.  Neck: Neck supple.  Cardiovascular: Normal rate.  Pulmonary/Chest: Effort normal. No respiratory distress.  Abdominal: She exhibits no distension.  Musculoskeletal: Normal range of motion.  Patient standing with forward flexion of hips and avoiding pressure on right buttock area.  Tender to palpation overlying lateral malleolus of right ankle, tenderness overlying medial joint line of right knee, bruising to lower right gluteal area, tenderness specifically overlying bruise, patient has full range of motion at ankle, and knee and hips  Nontender to palpation diffusely throughout cervical, thoracic and lumbar spine midline, nontender to palpation over mid sacral area.  Neurological: She is alert and oriented to person, place, and time.  Skin: Skin is warm and dry.  Psychiatric: She has a normal mood and affect.  Nursing note and vitals reviewed.    UC Treatments / Results  Labs (all labs ordered are listed, but only abnormal results are displayed) Labs Reviewed - No data to display  EKG None  Radiology Dg Ankle Complete Right  Result Date: 04/01/2018 CLINICAL DATA:  62 year old female with a history of fall EXAM: RIGHT ANKLE - COMPLETE 3+ VIEW COMPARISON:  None. FINDINGS: No acute displaced fracture identified no evidence of joint effusion. No focal soft tissue swelling. Ankle mortise is congruent.  Degenerative changes of the hindfoot. IMPRESSION: Negative for acute bony abnormality. Electronically Signed   By: Gilmer Mor D.O.   On: 04/01/2018 14:41   Dg Hip Unilat With Pelvis 2-3 Views Right  Result Date: 04/01/2018 CLINICAL DATA:  62 year old female with a history of fall and right leg pain EXAM: DG HIP (WITH OR WITHOUT PELVIS) 2-3V RIGHT COMPARISON:  None. FINDINGS: Bony pelvic ring is intact with no acute displaced fracture identified. Bilateral hips projects normally over the acetabula. Unremarkable appearance the proximal femurs. Mild degenerative changes. IMPRESSION: No acute  bony abnormality Electronically Signed   By: Gilmer Mor D.O.   On: 04/01/2018 14:46    Procedures Procedures (including critical care time)  Medications Ordered in UC Medications  ketorolac (TORADOL) injection 60 mg (60 mg Intramuscular Given 04/01/18 1450)    Initial Impression / Assessment and Plan / UC Course  I have reviewed the triage vital signs and the nursing notes.  Pertinent labs & imaging results that were available during my care of the patient were reviewed by me and considered in my medical decision making (see chart for details).     X-ray of ankle and hip obtained, no fracture or acute bony abnormality, likely ankle sprain and hip contusion/bruising along with bruising of the knee as well.  Recommending treatment with anti-inflammatories, ice and rest.  ASO for ankle support.  Expect gradual resolution over the next 1 to 2 weeks.Discussed strict return precautions. Patient verbalized understanding and is agreeable with plan.  Final Clinical Impressions(s) / UC Diagnoses   Final diagnoses:  Hip pain  Sprain of right ankle, unspecified ligament, initial encounter  Contusion of right hip, initial encounter  Fall, initial encounter     Discharge Instructions     We gave you a shot of toradol today Use anti-inflammatories for pain/swelling. You may take up to 600-800 mg Ibuprofen  every 8 hours with food. You may supplement Ibuprofen with Tylenol [phone] mg every 8 hours.  Please ice these areas on your hip, knee and ankle. Please wear ankle brace for support over the next 1 to 2 weeks I expect pain to gradually improve over the next 1 to 2 weeks  Please follow-up if pain worsening, not improving, developing new symptoms, loss of bowel or bladder control, numbness or tingling, weakness, persistent symptoms.    ED Prescriptions    Medication Sig Dispense Auth. Provider   ibuprofen (ADVIL,MOTRIN) 600 MG tablet Take 1 tablet (600 mg total) by mouth every 6 (six) hours as needed. 30 tablet Sloan Takagi, [place] C, PA-C     Controlled Substance Prescriptions [place] Controlled Substance Registry consulted? Not Applicable   Lew Dawes, [place] 04/01/18 1456

## 2018-04-01 NOTE — ED Triage Notes (Signed)
Pt states she fell on Thursday. Pt c/o right "foot, ankle, knee, and tailbone" pain of 8/10.

## 2018-04-11 ENCOUNTER — Ambulatory Visit (INDEPENDENT_AMBULATORY_CARE_PROVIDER_SITE_OTHER): Payer: Self-pay | Admitting: Bariatrics

## 2018-04-11 ENCOUNTER — Encounter (INDEPENDENT_AMBULATORY_CARE_PROVIDER_SITE_OTHER): Payer: Self-pay

## 2018-04-25 ENCOUNTER — Encounter: Payer: Self-pay | Admitting: Gastroenterology

## 2018-04-25 ENCOUNTER — Ambulatory Visit (INDEPENDENT_AMBULATORY_CARE_PROVIDER_SITE_OTHER): Payer: BLUE CROSS/BLUE SHIELD | Admitting: Gastroenterology

## 2018-04-25 ENCOUNTER — Telehealth: Payer: Self-pay | Admitting: Internal Medicine

## 2018-04-25 VITALS — BP 160/104 | HR 104 | Ht 60.0 in | Wt 244.2 lb

## 2018-04-25 DIAGNOSIS — R1013 Epigastric pain: Secondary | ICD-10-CM | POA: Diagnosis not present

## 2018-04-25 DIAGNOSIS — R110 Nausea: Secondary | ICD-10-CM | POA: Diagnosis not present

## 2018-04-25 DIAGNOSIS — Z1211 Encounter for screening for malignant neoplasm of colon: Secondary | ICD-10-CM | POA: Diagnosis not present

## 2018-04-25 DIAGNOSIS — G8929 Other chronic pain: Secondary | ICD-10-CM

## 2018-04-25 NOTE — Telephone Encounter (Signed)
Called and verified with patient medication and pharmacy. The medication was already sent to the correct pharmacy.   Nothing further needed.

## 2018-04-25 NOTE — Patient Instructions (Signed)
If you are age 62 or older, your body mass index should be between 23-30. Your Body mass index is 47.7 kg/m. If this is out of the aforementioned range listed, please consider follow up with your Primary Care Provider.  If you are age 78 or younger, your body mass index should be between 19-25. Your Body mass index is 47.7 kg/m. If this is out of the aformentioned range listed, please consider follow up with your Primary Care Provider.   It has been recommended to you by your physician that you have a(n) colonoscopy completed. Per your request, we did not schedule the procedure(s) today. Please contact our office at [phone] should you decide to have the procedure completed.  It was a pleasure to see you today!  Dr. Myrtie Neither

## 2018-04-25 NOTE — Progress Notes (Signed)
[facility] Consult Note:  History: Michelle Rios 04/25/2018  Referring physician: Garth Bigness, MD  Reason for consult/chief complaint: Anemia; Abdominal Pain (epigastric/upper abd pain 3 weeks ago with ED visit); and Nausea ( weeks ago)   Subjective  HPI:  Michelle Rios is a pleasant 62 year old woman referred by medicine clinic for abdominal pain and diarrhea that were going on with the last couple of months.  She had been at family medicine clinic 02/01/2018 with night sweats and diarrhea. She was in the emergency department 02/17/2018 with about a week of epigastric pain, nausea and diarrhea.  Symptoms reportedly improved with GI cocktail, some labs and imaging were done and she was given a referral for outpatient follow-up.  02/20/2018 office visit with family medicine clinic patient still not feeling well and was directly admitted. Discharge summary indicates negative testing for HIV, QuantiFERON, TSH, blood cultures, normal ultrasound of the abdomen, normal lipase, CMP, CBC, urinalysis. CT abdomen and pelvis showed only mild diverticulosis.  Her pain reportedly resolved overnight in the hospital and she did not have ongoing diarrhea, so an initial plan for urine 5 HIAA test and was discontinued.   Michelle Rios tells me that her abdominal pain resolved at least 3 weeks ago.  She has no further diarrhea, her bowel habits are back to 1 or 2 formed stools per day, there was never any rectal bleeding.  She denies nausea or vomiting early satiety, dysphagia or weight loss.  ROS:  Review of Systems  Constitutional: Negative for appetite change and unexpected weight change.  HENT: Negative for mouth sores and voice change.   Eyes: Negative for pain and redness.  Respiratory: Positive for cough. Negative for shortness of breath.   Cardiovascular: Negative for chest pain and palpitations.  Genitourinary: Negative for dysuria and hematuria.  Musculoskeletal:  Positive for arthralgias and back pain. Negative for myalgias.  Skin: Negative for pallor and rash.  Neurological: Negative for weakness and headaches.  Hematological: Negative for adenopathy.   Still has chronic sweating   Past Medical History: Past Medical History:  Diagnosis Date  . AKI (acute kidney injury) (HCC) 09/15/2015  . Anemia   . Arthritis    "knees" (02/20/2018)  . Asthma   . Daily headache   . Diarrhea 09/15/2015  . Hiatal hernia   . History of blood transfusion 1979   w/childbirth  . Hypertension   . Pulmonary embolism (HCC) 1990s X 1  . Sarcoidosis of lung [facility])      Past Surgical History: Past Surgical History:  Procedure Laterality Date  . ANKLE FRACTURE SURGERY Right   . KNEE ARTHROSCOPY Right 1990s  . TUBAL LIGATION  1990s  . VAGINAL HYSTERECTOMY  1990s  . WRIST FRACTURE SURGERY Left 2001     Family History: Family History  Problem Relation Age of Onset  . Heart attack Father   . Diabetes Mother   . Stroke Paternal Grandmother   . Breast cancer Sister   . Diabetes Brother   . Kidney cancer Sister     Social History: Social History   Socioeconomic History  . Marital status: Widowed    Spouse name: Not on file  . Number of children: 3  . Years of education: Not on file  . Highest education level: Not on file  Occupational History  . Occupation: CNA  Social Needs  . Financial resource strain: Not on file  . Food insecurity:    Worry: Not on file    Inability: Not  on file  . Transportation needs:    Medical: Not on file    Non-medical: Not on file  Tobacco Use  . Smoking status: Never Smoker  . Smokeless tobacco: Never Used  Substance and Sexual Activity  . Alcohol use: No  . Drug use: No  . Sexual activity: Not Currently  Lifestyle  . Physical activity:    Days per week: Not on file    Minutes per session: Not on file  . Stress: Not on file  Relationships  . Social connections:    Talks on phone: Not on file    Gets  together: Not on file    Attends religious service: Not on file    Active member of club or organization: Not on file    Attends meetings of clubs or organizations: Not on file    Relationship status: Not on file  Other Topics Concern  . Not on file  Social History Narrative  . Not on file    Allergies: Allergies  Allergen Reactions  . Ivp Dye [Iodinated Diagnostic Agents] Anaphylaxis and Swelling    Outpatient Meds: Current Outpatient Medications  Medication Sig Dispense Refill  . albuterol (PROVENTIL HFA;VENTOLIN HFA) 108 (90 Base) MCG/ACT inhaler Inhale 2 puffs into the lungs every 4 (four) hours as needed for wheezing or shortness of breath.    Marland Kitchen ibuprofen (ADVIL,MOTRIN) 600 MG tablet Take 1 tablet (600 mg total) by mouth every 6 (six) hours as needed. 30 tablet 0  . indapamide (LOZOL) 1.25 MG tablet Take 1 tablet by mouth daily.    . simethicone (MYLICON) 80 MG chewable tablet Chew 1 tablet (80 mg total) by mouth 4 (four) times daily as needed for flatulence. 30 tablet 0  . Sodium Hyaluronate (VISCO-3) 25 MG/2.5ML SOSY Inject into the articular space every 3 (three) months. In knees and ankles     No current facility-administered medications for this visit.       ___________________________________________________________________ Objective   Exam:  BP (!) 160/104 (BP Location: Left Arm, Patient Position: Sitting, Cuff Size: Large)   Pulse (!) 104   Ht 5' (1.524 m) Comment: height measured without shoes  Wt 244 lb 4 oz (110.8 kg)   BMI 47.70 kg/m    General: this is a(n) obese woman,  not acutely ill-appearing but is perspiring  Eyes: sclera anicteric, no redness  ENT: oral mucosa moist without lesions, no cervical or supraclavicular lymphadenopathy, good dentition  CV: RRR without murmur, S1/S2, no JVD, no peripheral edema  Resp: clear to auscultation bilaterally, normal RR and effort noted  GI: soft, no tenderness, with active bowel sounds. No guarding or  palpable organomegaly noted.  Skin; warm and dry, no rash or jaundice noted  Neuro: awake, alert and oriented x 3. Normal gross motor function and fluent speech  Labs:  CBC Latest Ref Rng & Units 02/27/2018 02/21/2018 02/20/2018  WBC 3.4 - 10.8 x10E3/uL 5.6 4.4 5.5  Hemoglobin 11.1 - 15.9 g/dL 16.1 11.8(L) 11.9(L)  Hematocrit 34.0 - 46.6 % 35.1 34.9(L) 34.5(L)  Platelets 150 - 450 x10E3/uL 340 282 289   CMP Latest Ref Rng & Units 02/21/2018 02/20/2018 02/17/2018  Glucose 70 - 99 mg/dL 096(E) 97 454(U)  BUN 8 - 23 mg/dL 6(L) 8 12  Creatinine 9.81 - 1.00 mg/dL 1.91 4.78 2.95  Sodium 135 - 145 mmol/L 134(L) 141 139  Potassium 3.5 - 5.1 mmol/L 3.5 3.8 4.0  Chloride 98 - 111 mmol/L 100 106 104  CO2 22 - 32  mmol/L 25 28 22   Calcium 8.9 - 10.3 mg/dL ) 9.3 9.4  Total Protein 6.5 - 8.1 g/dL - 7.2 7.8  Total Bilirubin 0.3 - 1.2 mg/dL - 0.7 0.6  Alkaline Phos 38 - 126 U/L - 72 76  AST 15 - 41 U/L - 18 21  ALT 0 - 44 U/L - 13 13     Radiologic Studies:  Ultrasound abdomen 02/17/2018, nothing acute.    CLINICAL DATA:  Upper abdominal pain and nausea. Diarrhea. Symptoms for 3 weeks.   EXAM: CT ABDOMEN AND PELVIS WITHOUT CONTRAST   TECHNIQUE: Multidetector CT imaging of the abdomen and pelvis was performed following the standard protocol without IV contrast.   COMPARISON:  Abdominal ultrasound yesterday.   FINDINGS: Lower chest: Scattered subpleural ground-glass opacities in both lower lobes, likely secondary to scarring. No confluent consolidation. No pleural fluid. Upper normal heart size. Atherosclerosis of thoracic aorta.   Hepatobiliary: Calcified granuloma in the posterior right lobe of the liver. No focal hepatic lesion on noncontrast exam. Gallbladder physiologically distended, no calcified stone. No biliary dilatation.   Pancreas: No ductal dilatation or inflammation.   Spleen: Normal in size without focal abnormality.   Adrenals/Urinary Tract: Adrenal glands.  No hydronephrosis. No perinephric edema. No urolithiasis. Urinary bladder is nondistended, no bladder wall thickening.   Stomach/Bowel: Stomach is within normal limits. No evidence of bowel wall thickening, distention, or inflammatory changes. Formed stool in the colon. No colonic inflammation. Minimal diverticulosis of the descending colon without diverticulitis. Cecum located in the midline pelvis, normal appendix at the midline.   Vascular/Lymphatic: Aortic atherosclerosis without aneurysm. No abdominopelvic adenopathy.   Reproductive: Post hysterectomy. Ovaries are quiescent. Incidental phleboliths in the right ovarian vein.   Other: No free air, free fluid, or intra-abdominal fluid collection. Minimal air in the lower anterior abdominal wall subcutaneous fat in a pattern consistent with subcutaneous injections.   Musculoskeletal: Degenerative change in the spine with large anterior osteophytes in the lower thoracic spine and facet hypertrophy the lower lumbar spine. There are no acute or suspicious osseous abnormalities.   IMPRESSION: 1. No acute findings or explanation for abdominal pain. 2. Mild colonic diverticulosis without diverticulitis. 3.  Aortic Atherosclerosis (ICD10-I70.0).     Electronically Signed   By: 02/19/2018 M.D.   On: 02/21/2018 00:25   Assessment: Encounter Diagnoses  Name Primary?  . Abdominal pain, chronic, epigastric Yes  . Special screening for malignant neoplasms, colon   . Nausea without vomiting     She had about a month of ongoing upper abdominal pain and intermittent diarrhea with nausea, all of which is now resolved.  She may have had an initial infectious illness with a protracted postinfectious syndrome.  Discharge summary indicates her diarrhea and abdominal pain resolved within a day of admission.  She does not appear to need further diagnostic work-up for this.  It does not seem likely that she has an underlying chronic  digestive condition.  I discussed a screening colonoscopy with her.  She seems to recall having had a colonoscopy perhaps 20 years ago, so she is not up-to-date on colon cancer screening.  She initially said no, but then requested some more information.  I described the procedure in detail along with risks and benefits, she said she would "consider it and pray on it".  She was given contact information for our clinic in case she would like to schedule her routine screening colonoscopy at some point.  Thank you for the courtesy of this  consult.  Please call me with any questions or concerns.  Charlie Pitter III  CC: Garth Bigness, MD

## 2018-05-01 ENCOUNTER — Telehealth: Payer: Self-pay | Admitting: Internal Medicine

## 2018-05-01 MED ORDER — ALBUTEROL SULFATE HFA 108 (90 BASE) MCG/ACT IN AERS: 2.0000 | INHALATION_SPRAY | RESPIRATORY_TRACT | 5 refills | Status: DC | PRN

## 2018-05-01 NOTE — Telephone Encounter (Signed)
Spoke with pt. She is needing a refill on Albuterol HFA. Rx has been sent in. Nothing further was needed. 

## 2018-05-24 ENCOUNTER — Encounter: Payer: Self-pay | Admitting: Internal Medicine

## 2018-05-24 ENCOUNTER — Ambulatory Visit (INDEPENDENT_AMBULATORY_CARE_PROVIDER_SITE_OTHER): Payer: BLUE CROSS/BLUE SHIELD | Admitting: Internal Medicine

## 2018-05-24 VITALS — BP 190/110 | HR 123 | Ht 60.0 in | Wt 248.0 lb

## 2018-05-24 DIAGNOSIS — I10 Essential (primary) hypertension: Secondary | ICD-10-CM

## 2018-05-24 DIAGNOSIS — D869 Sarcoidosis, unspecified: Secondary | ICD-10-CM

## 2018-05-24 NOTE — Patient Instructions (Signed)
lozol x 2 daily until you check with your PCP office  No evidence of active sarcoid or any pulmonary problem at present    Pulmonary follow up is as needed

## 2018-05-24 NOTE — Progress Notes (Signed)
Michelle Rios, female    DOB: [birth year], 62 y.o.   MRN: [id number]    Brief patient profile:  89 yobf  With morbid obesity remote sarcoid/ with  last previous resp problems sometime in 2017 then "fine for sev years" with onset variable sob since March 2019 with >>> admit    Date of Admission: 02/20/2018                      Date of Discharge: 02/21/2018   Discharge Diagnoses/Problem List:  Abdominal pain, diarrhea, diaphoresis Hypertension Anemia Sarcoidosis   Brief Hospital Course:  Michelle Rios is a 62 y.o. female who was a direct admission from the family medicine clinic after presenting with 1 month history of abdominal pain, yellow watery diarrhea, episodes of diaphoresis.  She has had prior work-up as outpatient for the symptoms including HIV, QuantiFERON gold, TSH, blood cultures that were all negative.  She had presented to the ED 3 days prior to admission for the same symptoms.  An ultrasound of her abdomen had been performed that was negative.  Lipase, CMP, CBC, UA were all within normal limits.  At the time of presentation to the family medicine clinic, patient was experiencing 10/10 pain and said that she had not been able to eat a full meal in 3 days.  She was admitted to the hospital, BMP, CBC, LFTs were within normal limits at the time of admission.  A CT scan of the abdomen and pelvis showed only mild diverticulosis.  5-HIAA 24-hour collection was obtained.  Overnight, patient's pain resolved and she did not continue to have diarrhea.  The morning following her admission, hemoglobin had decreased to 11.8.  Patient remained hemodynamically stable and asymptomatic.  She denied any hematuria, melena, hematochezia.  She noted that she had had a colonoscopy in the past but was unsure of when.  She denies dizziness upon standing.  FOBT was obtained and sent to the lab.  The decision was made to discharge the patient from the hospital due to resolution of symptoms and negative  work-up.  Following discharge, the lab appears to have discontinued her FOBT.  She will need a repeat FOBT, GI consult, and possible colonoscopy.  At the time of discharge patient was asymptomatic and stable.  Due to resolution of symptoms, this was likely caused by a viral gastroenteritis.  Upon admission, differential diagnosis included carcinoid syndrome.  This is an unlikely diagnosis as patient's diarrhea resolved without treatment and there was no associated flushing with the patient's symptoms.  Will need follow-up 5-HIAA urine test.  On discharge, the patient's hydrochlorothiazide was also discontinued.  She stated that she was not supposed to be on this medication and had received it from the pharmacy in error.  Chart review supported this statement from the patient.  Issues for Follow Up:  1. Will need follow-up of 5-HIAA results. 2. Will need repeat FOBT, GI consult, possible colonoscopy. 3. Will need repeat CBC for follow-up of anemia.     03/20/2018  Pulmonary consultation  Chief Complaint  Patient presents with  . Consult    Referred by Dr. Abelardo Diesel due to sarcoidosis, cough, and a rash that began 4 months ago. Pt states she was also hospitalized.  Dyspnea:  50 ft  Most days but quite variable/ no assoc cp  Cough: some cough in am wakes up with and also before suppoer, keeping her up some nocts Sleep: 30 degrees x years  SABA use: 1-2  x daily, not sure they help   Rec saba prn Return with meds    05/24/2018  f/u ov/Michelle Rios re: doe / both knees L > R arthritis bette injections  Chief Complaint  Patient presents with  . Follow-up    Breathing is unchanged "depending on what I am doing". She is coughing more at night- prod with greyish Pretlow sputum.  She rarely uses her albuterol inhaler.   Dyspnea:  Limited by knees L > R  Cough: variable  Sleeping: 30 degrees with pillow  SABA use: mininimal use/ benefit    No obvious pattern in  day to day or daytime variability or  assoc  mucus plugs or hemoptysis or cp or chest tightness, subjective wheeze or overt sinus or hb symptoms.    Also denies any obvious fluctuation of symptoms with weather or environmental changes or other aggravating or alleviating factors except as outlined above   No unusual exposure hx or h/o childhood pna/ asthma or knowledge of premature birth.  Current Allergies, Complete Past Medical History, Past Surgical History, Family History, and Social History were reviewed in [employer] record.  ROS  The following are not active complaints unless bolded Hoarseness, sore throat, dysphagia, dental problems, itching, sneezing,  nasal congestion or discharge of excess mucus or purulent secretions, ear ache,   fever, chills, sweats, unintended wt loss or wt gain, classically pleuritic or exertional cp,  Orthopnea chronically  pnd or arm/hand swelling  or leg swelling, presyncope, palpitations, abdominal pain, anorexia, nausea, vomiting, diarrhea  or change in bowel habits or change in bladder habits, change in stools or change in urine, dysuria, hematuria,  rash, arthralgias, visual complaints, headache, numbness, weakness or ataxia or problems with walking or coordination,  change in mood or  memory.        Current Meds  Medication Sig  . albuterol (PROVENTIL HFA;VENTOLIN HFA) 108 (90 Base) MCG/ACT inhaler Inhale 2 puffs into the lungs every 4 (four) hours as needed for wheezing or shortness of breath.  Marland Kitchen ibuprofen (ADVIL,MOTRIN) 600 MG tablet Take 1 tablet (600 mg total) by mouth every 6 (six) hours as needed.  . indapamide (LOZOL) 1.25 MG tablet Take 1 tablet by mouth daily.  . simethicone (MYLICON) 80 MG chewable tablet Chew 1 tablet (80 mg total) by mouth 4 (four) times daily as needed for flatulence.  . Sodium Hyaluronate (VISCO-3) 25 MG/2.5ML SOSY Inject into the articular space every 3 (three) months. In knees and ankles                        Objective:       amb bf nad  ? Belle affect    05/24/2018     248   03/20/18 249 lb (112.9 kg)  02/27/18 248 lb 9.6 oz (112.8 kg)  02/20/18 248 lb 7.3 oz (112.7 kg)    Vital signs reviewed - Note on arrival 02 sats  98% on RA and BP  190/110      HEENT: Poor  dentition, turbinates bilaterally, and oropharynx. Nl external ear canals without cough reflex   NECK :  without JVD/Nodes/TM/ nl carotid upstrokes bilaterally   LUNGS: no acc muscle use,  Nl contour chest with mild pseudowheeze only   CV:  RRR  no s3 or murmur or increase in P2, and no edema   ABD:  soft and nontender with nl inspiratory excursion in the supine position. No bruits or organomegaly appreciated, bowel  sounds nl  MS:  Nl gait/ ext warm without deformities, calf tenderness, cyanosis or clubbing No obvious joint restrictions   SKIN: warm and dry without lesions    NEURO:  alert, approp, nl sensorium with  no motor or cerebellar deficits apparent.       I personally reviewed images and agree with radiology impression as follows:  CXR:   03/20/18  1. Prominence of the ascending thoracic aorta noted. Ascending thoracic aortic aneurysm cannot be excluded. Similar findings noted on prior exam. 2.  Stable cardiomegaly.  No pulmonary venous congestion. 3.  No acute pulmonary disease.                Assessment

## 2018-05-28 ENCOUNTER — Encounter: Payer: Self-pay | Admitting: Internal Medicine

## 2018-05-28 NOTE — Assessment & Plan Note (Signed)
Not optimally controlled on present regimen. I reviewed this with the patient and emphasized importance of follow-up with primary care.   In meantimre rec double doe of lozol and no nsaids

## 2018-05-28 NOTE — Assessment & Plan Note (Signed)
No eivednce of active dz/ no regular pulmonary f/u needed

## 2018-05-28 NOTE — Assessment & Plan Note (Signed)
Body mass index is 48.43 kg/m.  -  trending no change  Lab Results  Component Value Date   TSH 2.330 02/02/2018     Contributing to gerd risk/ doe/reviewed the need and the process to achieve and maintain neg calorie balance > defer f/u primary care including intermittently monitoring thyroid status

## 2018-06-06 ENCOUNTER — Encounter: Payer: Self-pay | Admitting: Internal Medicine

## 2018-06-19 ENCOUNTER — Ambulatory Visit: Payer: BLUE CROSS/BLUE SHIELD | Admitting: Internal Medicine

## 2018-06-19 ENCOUNTER — Telehealth: Payer: Self-pay | Admitting: Internal Medicine

## 2018-06-19 NOTE — Telephone Encounter (Signed)
Ov today with all meds in hand, nothing to offer in meantime

## 2018-06-19 NOTE — Telephone Encounter (Signed)
Called and spoke with patient. She is aware and verbalized understanding. Patient has been scheduled. Nothing further needed.

## 2018-06-19 NOTE — Telephone Encounter (Signed)
Called and spoke with patient she stated that she is having a dry "hard" cough that causes "severe" chest pain in the middle of her and chest and down her back. She has a headache from the cough. No other symptoms present. No nausea or numbness. Patient denies going to the ER after advising her to. Patient denies coming in for an office visit. Patient stated that she needs medication sent in today.   MW please advise, thank you.

## 2018-07-14 ENCOUNTER — Other Ambulatory Visit: Payer: Self-pay | Admitting: Podiatry

## 2018-07-14 ENCOUNTER — Telehealth: Payer: Self-pay | Admitting: *Deleted

## 2018-07-14 ENCOUNTER — Ambulatory Visit (INDEPENDENT_AMBULATORY_CARE_PROVIDER_SITE_OTHER): Payer: BLUE CROSS/BLUE SHIELD | Admitting: Podiatry

## 2018-07-14 ENCOUNTER — Ambulatory Visit (INDEPENDENT_AMBULATORY_CARE_PROVIDER_SITE_OTHER): Payer: BLUE CROSS/BLUE SHIELD

## 2018-07-14 ENCOUNTER — Encounter: Payer: Self-pay | Admitting: Podiatry

## 2018-07-14 VITALS — BP 206/105 | HR 87 | Resp 16

## 2018-07-14 DIAGNOSIS — M7750 Other enthesopathy of unspecified foot: Secondary | ICD-10-CM

## 2018-07-14 DIAGNOSIS — M779 Enthesopathy, unspecified: Secondary | ICD-10-CM | POA: Diagnosis not present

## 2018-07-14 DIAGNOSIS — T148XXA Other injury of unspecified body region, initial encounter: Secondary | ICD-10-CM

## 2018-07-14 DIAGNOSIS — M778 Other enthesopathies, not elsewhere classified: Secondary | ICD-10-CM

## 2018-07-14 DIAGNOSIS — M7751 Other enthesopathy of right foot: Secondary | ICD-10-CM

## 2018-07-14 NOTE — Telephone Encounter (Deleted)
-----   Message from Matthew R Wagoner, DPM sent at 07/14/2018  1:57 PM EST ----- Can you please order an MRI of the right ankle to rule out peroneal tendon tear? Thanks.   

## 2018-07-16 NOTE — Progress Notes (Signed)
Subjective:   Patient ID: Michelle Rios, female   DOB: 62 y.o.   MRN: [id number]   HPI 62 year old female presents the office today for concerns of pain to her right foot and ankle.  She states that she initially had an injury in 2015 and then again in 2017.  She states that she was told that it never was treated properly at the time of the injury.  She has been seen by Dr. Victorino Dike in his office since 2018 for this issue and she is been getting injections which do help.  However she wants to stop injections the only other treatment that was recommended with surgery and she was induced in the try to hold off on surgery and she wanted a second opinion.  She describes a throbbing sensation in the lateral aspect the right foot.  Difficult to determine what the initial injury was.  She has no other concerns.  Review of Systems  All other systems reviewed and are negative.  Past Medical History:  Diagnosis Date  . AKI (acute kidney injury) (HCC) 09/15/2015  . Anemia   . Arthritis    "knees" (02/20/2018)  . Asthma   . Daily headache   . Diarrhea 09/15/2015  . Hiatal hernia   . History of blood transfusion 1979   w/childbirth  . Hypertension   . Pulmonary embolism (HCC) 1990s X 1  . Sarcoidosis of lung [facility])     Past Surgical History:  Procedure Laterality Date  . ANKLE FRACTURE SURGERY Right   . KNEE ARTHROSCOPY Right 1990s  . TUBAL LIGATION  1990s  . VAGINAL HYSTERECTOMY  1990s  . WRIST FRACTURE SURGERY Left 2001     Current Outpatient Medications:  .  albuterol (PROVENTIL HFA;VENTOLIN HFA) 108 (90 Base) MCG/ACT inhaler, Inhale 2 puffs into the lungs every 4 (four) hours as needed for wheezing or shortness of breath., Disp: 1 Inhaler, Rfl: 5 .  ibuprofen (ADVIL,MOTRIN) 600 MG tablet, Take 1 tablet (600 mg total) by mouth every 6 (six) hours as needed., Disp: 30 tablet, Rfl: 0 .  indapamide (LOZOL) 1.25 MG tablet, Take 2 tablets by mouth daily. , Disp: , Rfl:  .  simethicone  (MYLICON) 80 MG chewable tablet, Chew 1 tablet (80 mg total) by mouth 4 (four) times daily as needed for flatulence., Disp: 30 tablet, Rfl: 0 .  Sodium Hyaluronate (VISCO-3) 25 MG/2.5ML SOSY, Inject into the articular space every 3 (three) months. In knees and ankles, Disp: , Rfl:   Allergies  Allergen Reactions  . Ivp Dye [Iodinated Diagnostic Agents] Anaphylaxis and Swelling         Objective:  Physical Exam  General: AAO x3, NAD  Dermatological: Skin is warm, dry and supple bilateral. Nails x 10 are well manicured; remaining integument appears unremarkable at this time. There are no open sores, no preulcerative lesions, no rash or signs of infection present.  Vascular: Dorsalis Pedis artery and Posterior Tibial artery pedal pulses are 2/4 bilateral with immedate capillary fill time. There is no pain with calf compression, swelling, warmth, erythema.   Neruologic: Grossly intact via light touch bilateral.  Protective threshold with Semmes Wienstein monofilament intact to all pedal sites bilateral.   Musculoskeletal: There is tenderness along the course the peroneal tendons posterior and inferior to lateral malleolus and towards the insertion of the fifth metatarsal base.  There is minimal edema to this area there is no erythema or warmth.  There is also tenderness along the sinus tarsi at the  lateral aspect the foot.  This is apparently where she has been getting the injections.  There is no other areas of pinpoint tenderness.  Decreased medial arch height.  Muscular strength 5/5 in all groups tested bilateral.  Gait: Unassisted, Nonantalgic.       Assessment:   62 year old female with peroneal tendinitis, rule out tear with capsulitis     Plan:  -Treatment options discussed including all alternatives, risks, and complications -Etiology of symptoms were discussed -X-rays were obtained and reviewed with the patient.  No evidence of acute fracture or stress fracture identified  today. -At this time she is had ongoing symptoms for last couple years without any significant resolution and she wants to discuss other options.  Prior to this I do think an MRI of the right ankle is warranted to rule out a peroneal tendon tear as this is where she gets majority of tenderness.  We will order this for her.  We discussed.  Treatment options will await the results of the MRI. -Her blood pressures been high.  She has been running this when she is in a contact her doctor.  She has no symptoms associated with her blood pressure.  Vivi Barrack DPM

## 2018-07-17 NOTE — Telephone Encounter (Signed)
-----   Message from Vivi Barrack, DPM sent at 07/14/2018  1:57 PM EST ----- Can you please order an MRI of the right ankle to rule out peroneal tendon tear? Thanks.

## 2018-07-17 NOTE — Telephone Encounter (Signed)
I spent over 20 minutes on the [employer] line without Associates assistance.

## 2018-07-17 NOTE — Telephone Encounter (Signed)
Faxed orders to [facility]. 

## 2018-07-20 NOTE — Telephone Encounter (Signed)
[employer] Delena Bali APPROVED MRI [id number] RIGHT ANKLE Atha Starks, PA# [id number].

## 2018-07-27 ENCOUNTER — Ambulatory Visit
Admission: RE | Admit: 2018-07-27 | Discharge: 2018-07-27 | Disposition: A | Discharge status: Home / Self Care | Payer: BLUE CROSS/BLUE SHIELD | Source: Ambulatory Visit | Attending: Podiatry | Admitting: Podiatry

## 2018-07-27 DIAGNOSIS — M779 Enthesopathy, unspecified: Secondary | ICD-10-CM

## 2018-07-27 DIAGNOSIS — T148XXA Other injury of unspecified body region, initial encounter: Secondary | ICD-10-CM

## 2018-07-27 DIAGNOSIS — M7750 Other enthesopathy of unspecified foot: Secondary | ICD-10-CM

## 2018-07-31 ENCOUNTER — Telehealth: Payer: Self-pay | Admitting: *Deleted

## 2018-07-31 NOTE — Telephone Encounter (Signed)
-----   Message from Vivi Barrack, DPM sent at 07/28/2018  1:33 PM EST ----- Please let her know that the the MRI does shoe partial tear of the peroneal tendon with inflammation. She should go into a CAM boot and please have her follow-up. Thanks.

## 2018-07-31 NOTE — Telephone Encounter (Signed)
Left message for pt to call for results  

## 2018-07-31 NOTE — Telephone Encounter (Signed)
Pt called for results and I informed of Dr. Gabriel Rung review of results and orders. Pt states she has a boot. I told pt to wear the boot as much as possible except to sleep or shower and Dr. Ardelle Anton would like to see her in 2-3 weeks. Transferred pt to schedulers.

## 2018-08-04 ENCOUNTER — Encounter: Payer: Self-pay | Admitting: Family Medicine

## 2018-08-15 ENCOUNTER — Ambulatory Visit: Payer: BLUE CROSS/BLUE SHIELD | Admitting: Podiatry

## 2018-08-15 DIAGNOSIS — M7751 Other enthesopathy of right foot: Secondary | ICD-10-CM | POA: Diagnosis not present

## 2018-08-15 DIAGNOSIS — M7750 Other enthesopathy of unspecified foot: Secondary | ICD-10-CM

## 2018-08-15 DIAGNOSIS — T148XXA Other injury of unspecified body region, initial encounter: Secondary | ICD-10-CM

## 2018-08-15 MED ORDER — MELOXICAM 15 MG PO TABS: 15.0000 mg | ORAL_TABLET | Freq: Every day | ORAL | 0 refills | Status: AC

## 2018-08-15 NOTE — Progress Notes (Signed)
Subjective: 63 year old female presents the office today for follow-up evaluation of right ankle pain and discuss MRI results.  She has gone back into the cam boot although hers is worn out she needs a new one.  He states that she still gets quite a bit of pain more to the lateral, outside aspect of the ankle.  This is been a chronic issue for her over the last couple of years.  She has no new concerns no recent injury or trauma since I last saw her. Denies any systemic complaints such as fevers, chills, nausea, vomiting. No acute changes since last appointment, and no other complaints at this time.   Objective: AAO x3, NAD DP/PT pulses palpable bilaterally, CRT less than 3 seconds There is continuation of tenderness to the lateral aspect of the ankle and foot and there is some mild edema to this area.  This is on the area of the peroneal tendons.  There is also mild discomfort on the sinus tarsi to the lateral aspect of the foot.  There is no pain with ankle joint.  No other areas of pinpoint tenderness elicited at this time.  No erythema or warmth.  No open lesions or pre-ulcerative lesions.  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  -MRI results were reviewed with her.  At this time we discussed both conservative as well as surgical options.  He does not want to have surgery.  Due to this I recommend immobilization in the cam boot and new cam boot was dispensed today.  I want her to ice as well and will continue anti-inflammatorie -Prescribed mobic. Discussed side effects of the medication and directed to stop if any are to occur and call the office.  -A follow-up in about 4 weeks and at that time she is doing better we will start physical therapy. -Patient encouraged to call the office with any  questions, concerns, change in symptoms.   swollen glands

## 2018-08-31 ENCOUNTER — Encounter (HOSPITAL_COMMUNITY): Payer: Self-pay

## 2018-08-31 ENCOUNTER — Other Ambulatory Visit: Payer: Self-pay

## 2018-08-31 ENCOUNTER — Emergency Department (HOSPITAL_COMMUNITY)
Admission: EM | Admit: 2018-08-31 | Discharge: 2018-08-31 | Disposition: A | Discharge status: Home / Self Care | Payer: BLUE CROSS/BLUE SHIELD | Attending: Emergency Medicine | Admitting: Emergency Medicine

## 2018-08-31 DIAGNOSIS — Z79899 Other long term (current) drug therapy: Secondary | ICD-10-CM | POA: Insufficient documentation

## 2018-08-31 DIAGNOSIS — I10 Essential (primary) hypertension: Secondary | ICD-10-CM | POA: Insufficient documentation

## 2018-08-31 DIAGNOSIS — R1013 Epigastric pain: Secondary | ICD-10-CM | POA: Insufficient documentation

## 2018-08-31 DIAGNOSIS — R300 Dysuria: Secondary | ICD-10-CM | POA: Insufficient documentation

## 2018-08-31 DIAGNOSIS — K644 Residual hemorrhoidal skin tags: Secondary | ICD-10-CM

## 2018-08-31 DIAGNOSIS — J45909 Unspecified asthma, uncomplicated: Secondary | ICD-10-CM | POA: Insufficient documentation

## 2018-08-31 DIAGNOSIS — K921 Melena: Secondary | ICD-10-CM | POA: Insufficient documentation

## 2018-08-31 LAB — CBC WITH DIFFERENTIAL/PLATELET
Abs Immature Granulocytes: 0.01 10*3/uL (ref 0.00–0.07)
BASOS PCT: 0 %
Basophils Absolute: 0 10*3/uL (ref 0.0–0.1)
Eosinophils Absolute: 0 10*3/uL (ref 0.0–0.5)
Eosinophils Relative: 1 %
HCT: 36.6 % (ref 36.0–46.0)
Hemoglobin: 12.3 g/dL (ref 12.0–15.0)
Immature Granulocytes: 0 %
Lymphocytes Relative: 32 %
Lymphs Abs: 1.9 10*3/uL (ref 0.7–4.0)
MCH: 33.2 pg (ref 26.0–34.0)
MCHC: 33.6 g/dL (ref 30.0–36.0)
MCV: 98.9 fL (ref 80.0–100.0)
Monocytes Absolute: 0.4 10*3/uL (ref 0.1–1.0)
Monocytes Relative: 7 %
Neutro Abs: 3.7 10*3/uL (ref 1.7–7.7)
Neutrophils Relative %: 60 %
PLATELETS: 343 10*3/uL (ref 150–400)
RBC: 3.7 MIL/uL — ABNORMAL LOW (ref 3.87–5.11)
RDW: 11.1 % — ABNORMAL LOW (ref 11.5–15.5)
WBC: 6.1 10*3/uL (ref 4.0–10.5)
nRBC: 0 % (ref 0.0–0.2)

## 2018-08-31 LAB — COMPREHENSIVE METABOLIC PANEL
ALT: 17 U/L (ref 0–44)
AST: 21 U/L (ref 15–41)
Albumin: 3.7 g/dL (ref 3.5–5.0)
Alkaline Phosphatase: 64 U/L (ref 38–126)
Anion gap: 13 (ref 5–15)
BUN: 14 mg/dL (ref 8–23)
CO2: 26 mmol/L (ref 22–32)
Calcium: 9.6 mg/dL (ref 8.9–10.3)
Chloride: 101 mmol/L (ref 98–111)
Creatinine, Ser: 0.89 mg/dL (ref 0.44–1.00)
GFR calc Af Amer: >60 mL/min (ref >60)
Glucose, Bld: 99 mg/dL (ref 70–99)
Potassium: 3.9 mmol/L (ref 3.5–5.1)
Sodium: 140 mmol/L (ref 135–145)
Total Bilirubin: 0.8 mg/dL (ref 0.3–1.2)
Total Protein: 7.6 g/dL (ref 6.5–8.1)

## 2018-08-31 LAB — URINALYSIS, ROUTINE W REFLEX MICROSCOPIC
Bilirubin Urine: NEGATIVE
Glucose, UA: NEGATIVE mg/dL
HGB URINE DIPSTICK: NEGATIVE
Ketones, ur: NEGATIVE mg/dL
Leukocytes, UA: NEGATIVE
Nitrite: NEGATIVE
Protein, ur: NEGATIVE mg/dL
Specific Gravity, Urine: 1.017 (ref 1.005–1.030)
pH: 5 (ref 5.0–8.0)

## 2018-08-31 LAB — POC OCCULT BLOOD, ED: Fecal Occult Bld: POSITIVE — AB

## 2018-08-31 LAB — LIPASE, BLOOD: Lipase: 21 U/L (ref 11–51)

## 2018-08-31 MED ORDER — MORPHINE SULFATE (PF) 4 MG/ML IV SOLN
6.0000 mg | Freq: Once | INTRAVENOUS | Status: AC
  Administered 2018-08-31: 6 mg via INTRAVENOUS
  Filled 2018-08-31: qty 2

## 2018-08-31 MED ORDER — DICYCLOMINE HCL 20 MG PO TABS
20.0000 mg | ORAL_TABLET | Freq: Once | ORAL | Status: AC
  Administered 2018-08-31: 20 mg via ORAL
  Filled 2018-08-31: qty 1

## 2018-08-31 MED ORDER — ONDANSETRON 4 MG PO TBDP: 4.0000 mg | ORAL_TABLET | Freq: Three times a day (TID) | ORAL | 0 refills | Status: DC | PRN

## 2018-08-31 MED ORDER — ALUM & MAG HYDROXIDE-SIMETH 200-200-20 MG/5ML PO SUSP
30.0000 mL | Freq: Once | ORAL | Status: AC
  Administered 2018-08-31: 30 mL via ORAL
  Filled 2018-08-31: qty 30

## 2018-08-31 MED ORDER — FAMOTIDINE 20 MG PO TABS
40.0000 mg | ORAL_TABLET | Freq: Once | ORAL | Status: AC
  Administered 2018-08-31: 40 mg via ORAL
  Filled 2018-08-31: qty 2

## 2018-08-31 MED ORDER — SODIUM CHLORIDE 0.9 % IV BOLUS
1000.0000 mL | Freq: Once | INTRAVENOUS | Status: AC
  Administered 2018-08-31: 1000 mL via INTRAVENOUS

## 2018-08-31 MED ORDER — LIDOCAINE VISCOUS HCL 2 % MT SOLN
15.0000 mL | Freq: Once | OROMUCOSAL | Status: AC
  Administered 2018-08-31: 15 mL via ORAL
  Filled 2018-08-31: qty 15

## 2018-08-31 MED ORDER — ONDANSETRON HCL 4 MG/2ML IJ SOLN: 4.0000 mg | Freq: Once | INTRAMUSCULAR | Status: DC

## 2018-08-31 MED ORDER — METOCLOPRAMIDE HCL 5 MG/ML IJ SOLN
10.0000 mg | Freq: Once | INTRAMUSCULAR | Status: AC
  Administered 2018-08-31: 10 mg via INTRAVENOUS
  Filled 2018-08-31: qty 2

## 2018-08-31 NOTE — ED Notes (Signed)
This RN assisted pt to restroom. 

## 2018-08-31 NOTE — ED Triage Notes (Signed)
Pt arrives with complaints of a small amount of light colored blood in her loose stool this morning, one episode at 0400. Pt also has lower abdominal cramping. Pt placed in position of comfort with call bell in reach.

## 2018-08-31 NOTE — Discharge Instructions (Addendum)
You were seen in the ER for epigastric abdominal pain, nausea, bright red blood in stool, diarrhea, burning with urination.   Labs and urine looked normal.   You felt better after medicines.   I suspect your symptoms may have been due to either acid reflux or gastritis.  Follow up with gastroenterology in 1 week for persistent or return of symptoms to rule out other things such as ulcers, etc.  Return to the ER for fever, worsening constant pain to the right upper or right lower abdomen, dark or black blood in stool or vomit, chest pain, shortness of breath.   Take omeprazole 40 mg every morning on an empty stomach, wait 20-30 min before eating.  Take 20 mg famotidine every morning and night (twice a day).  Take sucralfate suspension 30 min before meals and once at night time. Zofran 4 mg every 8 hours for nausea.   Avoid irritating foods or drinks such as alcohol, coffee, greasy fatty or acidic foods.  Avoid ibuprofen or meloxicam containing products.

## 2018-08-31 NOTE — ED Notes (Signed)
Patient verbalizes understanding of discharge instructions. Opportunity for questioning and answers were provided. Armband removed by staff, pt discharged from ED.  

## 2018-08-31 NOTE — ED Notes (Signed)
Unable to start IV or obtain blood work at this time, IV team consulted

## 2018-08-31 NOTE — ED Provider Notes (Signed)
[facility]   CSN: [id number] Arrival date & time: 08/31/18  [id number]     History   Chief Complaint Chief Complaint  Patient presents with  . Abdominal Pain  . Rectal Bleeding    HPI Michelle Rios is a 63 y.o. female with h/o anemia, chronic epigastric abdominal pain, obesity, HTN, HLD, acid reflux, sarcoidosis, if here for evaluation of blood in her stool and diarrhea.  This morning she woke up and had 3 BM, initially soft then became watery.  She noticed bright streaks of blood after the third bowel movement on the toilet paper and drops in the toilet. She brought a sample of toilet water that looks like diluted bloody water.  BM were painless.  Associated with nausea, epigastric abdominal, sweats, fevers (99-100 orally), chills.  Reports chronic epigastric abdominal pain > 6 months, but has worsened in the last 2 days .  Has noticed dysuria. She has had epigastric abdominal pain, nausea, sweats, diarrhea in the past and was told it was colitis.  Her doctors recommended colonoscopy but she has refused because she doesn't want to be sedated for the procedure.  Chart review shows last year she presented to ER for n/v, epigastric abdominal pain that resolved with GI cocktail.  She was then admitted for this and had normal CT AP and symptoms resolved.  She f/u with GI 04/2018 but symptoms resolved and no further recommendations given. She takes tylenol for abdominal pain without relief.  Recently prescribed meloxicam by podiatry that she took for 1 month. She used to take zantac but not anymore. Denies ETOH abuse or h/o GI bleeds.  No associated CP, SOB, palpitations, vomiting.    HPI  Past Medical History:  Diagnosis Date  . AKI (acute kidney injury) (HCC) 09/15/2015  . Anemia   . Arthritis    "knees" (02/20/2018)  . Asthma   . Daily headache   . Diarrhea 09/15/2015  . Hiatal hernia   . History of blood transfusion 1979   w/childbirth    . Hypertension   . Pulmonary embolism (HCC) 1990s X 1  . Sarcoidosis of lung [facility](HCC)     Patient Active Problem List   Diagnosis Date Noted  . Abdominal pain 02/20/2018  . Diarrhea 02/20/2018  . Unexplained night sweats 02/01/2018  . Rash 02/01/2018  . Anemia 12/06/2017  . HLD (hyperlipidemia) 12/06/2017  . Acute reaction to stress 11/12/2017  . Pain in right knee 09/23/2017  . Essential hypertension 02/09/2017  . Morbid obesity due to excess calories (HCC) 02/09/2017  . Sarcoidosis 02/09/2017  . Right upper quadrant abdominal tenderness   . SOB (shortness of breath)     Past Surgical History:  Procedure Laterality Date  . ANKLE FRACTURE SURGERY Right   . KNEE ARTHROSCOPY Right 1990s  . TUBAL LIGATION  1990s  . VAGINAL HYSTERECTOMY  1990s  . WRIST FRACTURE SURGERY Left 2001     OB History   No obstetric history on file.      Home Medications    Prior to Admission medications   Medication Sig Start Date End Date Taking? Authorizing Provider  acetaminophen (TYLENOL) 650 MG CR tablet Take 650 mg by mouth every 8 (eight) hours as needed for pain.   Yes [provider]  albuterol (PROVENTIL HFA;VENTOLIN HFA) 108 (90 Base) MCG/ACT inhaler Inhale 2 puffs into the lungs every 4 (four) hours as needed for wheezing or shortness of breath. 05/01/18  Yes Nyoka CowdenWert, Michael B,  MD  indapamide (LOZOL) 1.25 MG tablet Take 1.25 mg by mouth 2 (two) times daily.    Yes [provider]  meloxicam (MOBIC) 15 MG tablet Take 1 tablet (15 mg total) by mouth daily. 08/15/18 08/15/19 Yes Vivi Barrack, DPM  ibuprofen (ADVIL,MOTRIN) 600 MG tablet Take 1 tablet (600 mg total) by mouth every 6 (six) hours as needed. Patient not taking: Reported on 08/31/2018 04/01/18   Wieters, Hallie C, PA-C  ondansetron (ZOFRAN ODT) 4 MG disintegrating tablet Take 1 tablet (4 mg total) by mouth every 8 (eight) hours as needed for nausea or vomiting. 08/31/18   Liberty Handy, PA-C  simethicone  (MYLICON) 80 MG chewable tablet Chew 1 tablet (80 mg total) by mouth 4 (four) times daily as needed for flatulence. Patient not taking: Reported on 08/31/2018 02/21/18   Shirley, [place], DO    Family History Family History  Problem Relation Age of Onset  . Heart attack Father   . Diabetes Mother   . Stroke Paternal Grandmother   . Breast cancer Sister   . Diabetes Brother   . Kidney cancer Sister     Social History Social History   Tobacco Use  . Smoking status: Never Smoker  . Smokeless tobacco: Never Used  Substance Use Topics  . Alcohol use: No  . Drug use: No     Allergies   Ivp dye [iodinated diagnostic agents]   Review of Systems Review of Systems  Gastrointestinal: Positive for abdominal pain, blood in stool, diarrhea and nausea.  Genitourinary: Positive for dysuria.  All other systems reviewed and are negative.    Physical Exam Updated Vital Signs BP 116/60   Pulse 63   Temp 98.1 F (36.7 C) (Oral)   Resp 12   Ht 5\' 1"  (1.549 m)   Wt 112.5 kg   SpO2 98%   BMI 46.86 kg/m   Physical Exam Vitals signs and nursing note reviewed. Exam conducted with a chaperone present.  Constitutional:      Appearance: She is well-developed.     Comments: Non toxic  HENT:     Head: Normocephalic and atraumatic.     Nose: Nose normal.  Eyes:     Conjunctiva/sclera: Conjunctivae normal.     Pupils: Pupils are equal, round, and reactive to light.  Neck:     Musculoskeletal: Normal range of motion.  Cardiovascular:     Rate and Rhythm: Normal rate and regular rhythm.  Pulmonary:     Effort: Pulmonary effort is normal.     Breath sounds: Normal breath sounds.  Abdominal:     General: Bowel sounds are normal.     Palpations: Abdomen is soft.     Tenderness: There is abdominal tenderness in the epigastric area.     Comments: No G/R/R. No suprapubic or CVA tenderness. Negative Murphy's and McBurney's. Active BS to lower quadrants.   Genitourinary:    Rectum: Guaiac  result positive. External hemorrhoid present.     Comments: Large, non tender hemorrhoid at 6 o'clock.  No obvious fissures. Large body habitus makes exam difficult.  Scant amount of stool on finger is light [place].  Mild pain reported with DRE.  Perianal skin without obvious abscess.  Musculoskeletal: Normal range of motion.  Skin:    General: Skin is warm and dry.     Capillary Refill: Capillary refill takes less than 2 seconds.  Neurological:     Mental Status: She is alert and oriented to person, place, and time.  Psychiatric:        Behavior: Behavior normal.      ED Treatments / Results  Labs (all labs ordered are listed, but only abnormal results are displayed) Labs Reviewed  CBC WITH DIFFERENTIAL/PLATELET - Abnormal; Notable for the following components:      Result Value   RBC 3.70 (*)    RDW 11.1 (*)    All other components within normal limits  URINALYSIS, ROUTINE W REFLEX MICROSCOPIC - Abnormal; Notable for the following components:   APPearance HAZY (*)    All other components within normal limits  POC OCCULT BLOOD, ED - Abnormal; Notable for the following components:   Fecal Occult Bld POSITIVE (*)    All other components within normal limits  COMPREHENSIVE METABOLIC PANEL  LIPASE, BLOOD    EKG None  Radiology No results found.  Procedures Procedures (including critical care time)  Medications Ordered in ED Medications  sodium chloride 0.9 % bolus 1,000 mL (1,000 mLs Intravenous New Bag/Given 08/31/18 1140)  alum & mag hydroxide-simeth (MAALOX/MYLANTA) 200-200-20 MG/5ML suspension 30 mL (30 mLs Oral Given 08/31/18 0923)    And  lidocaine (XYLOCAINE) 2 % viscous mouth solution 15 mL (15 mLs Oral Given 08/31/18 0923)  dicyclomine (BENTYL) tablet 20 mg (20 mg Oral Given 08/31/18 0925)  metoCLOPramide (REGLAN) injection 10 mg (10 mg Intravenous Given 08/31/18 0925)  famotidine (PEPCID) tablet 40 mg (40 mg Oral Given 08/31/18 0925)  morphine 4 MG/ML injection 6 mg (6 mg  Intravenous Given 08/31/18 1134)     Initial Impression / Assessment and Plan / ED Course  I have reviewed the triage vital signs and the nursing notes.  Pertinent labs & imaging results that were available during my care of the patient were reviewed by me and considered in my medical decision making (see chart for details).  Clinical Course as of Aug 31 1224  Thu Aug 31, 2018  1021 Labs unremarkable.  Discussed labs with patient.  She is received Bentyl, GI cocktail, Pepcid, Reglan and pain is decreased from a 10 to a 9.  Nausea has resolved.  She does not look significantly uncomfortable on exam.   [CG]    Clinical Course User Index [CG] Liberty HandyGibbons, Arnella Pralle J, PA-C   63 year old obese female with acute on chronic epigastric abdominal pain, diarrhea, hematochezia, subjective diaphoresis and fevers.  Nontoxic.  No peritonitis.  Negative Murphy's.  Chronic abd pain evaluated by ultrasound and CT A/P in the past.  Followed up with GI in 04/2018. Recent increase of meloxicam and discontinuation of Zantac.  High suspicion is for gastritis versus PUD/GERD.  Also considering pancreatitis, biliary colic.  She has no fever, tachycardia and based on abd exam unlikely to be cholecystitis, appendicitis.  She has no lower abdominal tenderness but has history of diverticulosis per CT A/P so colitis/diverticulitis also considered.  On exam she has a nontender nonthrombosed external hemorrhoid and mild tenderness with DRE, hematochezia could also just be from bleeding internal hemorrhoid.  We will obtain labs, provide analgesia.  UA for reports of dysuria.  1030: Labs unremarkable.  Given age, risk factors, location of pain EKG was obtained and this shows no ischemia, I doubt ACS referred pain.  Her pain has minimally improved from a 10 to a 9.  On exam however she does not look very uncomfortable and pain is out of proportion to exam.  We will give morphine, IV fluids, await urine and reassess.  Consider higher  degree of imaging if persistent/refractory pain.  1215: Symptoms completely resolved. Tolerating fluids. I repeated abd exam and now benign. Will dc with presumed dx gastritis vs PUD/GERD, will dc with omeprazole, pepcid, carafate, diet modification and f/u with GI for persistent symptoms. Return precautions given. Pt comfortable with this plan.    Final Clinical Impressions(s) / ED Diagnoses   Final diagnoses:  Hematochezia  Epigastric abdominal pain  External hemorrhoid  Dysuria    ED Discharge Orders         Ordered    ondansetron (ZOFRAN ODT) 4 MG disintegrating tablet  Every 8 hours PRN     08/31/18 1225           Jerrell Mylar 08/31/18 1226    Lorre Nick, MD 09/02/18 1122

## 2018-08-31 NOTE — ED Notes (Signed)
Nurse is starting a IV

## 2018-09-05 ENCOUNTER — Ambulatory Visit: Payer: BLUE CROSS/BLUE SHIELD | Admitting: Podiatry

## 2018-09-05 DIAGNOSIS — M7750 Other enthesopathy of unspecified foot: Secondary | ICD-10-CM | POA: Diagnosis not present

## 2018-09-05 DIAGNOSIS — T148XXA Other injury of unspecified body region, initial encounter: Secondary | ICD-10-CM

## 2018-09-05 MED ORDER — DICLOFENAC SODIUM 1 % TD GEL: 2.0000 g | Freq: Four times a day (QID) | TRANSDERMAL | 2 refills | Status: DC

## 2018-09-05 NOTE — Progress Notes (Signed)
Subjective: 63 year old female presents the office today for follow-up evaluation of peroneal tendon tearing on the right side.  She states that she has been doing well overall her pain has improved however she still gets some discomfort.  She is been in the cam boot.  Still also she is had quite a bit of stomach issues.  She cannot pick up the meloxicam.  Denies any recent injury or trauma since I last saw her and she has no new concerns. Denies any systemic complaints such as fevers, chills, nausea, vomiting. No acute changes since last appointment, and no other complaints at this time.   Objective: AAO x3, NAD DP/PT pulses palpable bilaterally, CRT less than 3 seconds There is improved but still area of tenderness on the course the peroneal tendons just posterior and inferior to the lateral malleolus.  Overall disease appears grossly intact.  There is minimal swelling but there is no erythema or warmth.  There is no other areas of pinpoint tenderness.  No pallor Achilles tendon, plantar fascia or other tendons. No open lesions or pre-ulcerative lesions.  No pain with calf compression, swelling, warmth, erythema  Assessment: Right peroneal partial tearing  Plan: -All treatment options discussed with the patient including all alternatives, risks, complications.  -Overall she states that she is doing better and there is decreased tenderness however she still having some tenderness.  Due to the amount of tenderness she is having little more upon physical therapy where to continue mobilization of the cam boot.  I will see her back next couple weeks at that time likely start physical therapy if she is improving.  Renal hold off any oral medication at this time I prescribed Voltaren gel.  Continue ice the area as well. -Patient encouraged to call the office with any questions, concerns, change in symptoms.   Vivi BarrackMatthew R Marisue Canion DPM

## 2018-09-26 ENCOUNTER — Ambulatory Visit (INDEPENDENT_AMBULATORY_CARE_PROVIDER_SITE_OTHER): Payer: BLUE CROSS/BLUE SHIELD | Admitting: Podiatry

## 2018-09-26 ENCOUNTER — Other Ambulatory Visit: Payer: Self-pay | Admitting: Family Medicine

## 2018-09-26 DIAGNOSIS — M7750 Other enthesopathy of unspecified foot: Secondary | ICD-10-CM

## 2018-09-26 DIAGNOSIS — M7751 Other enthesopathy of right foot: Secondary | ICD-10-CM

## 2018-09-26 DIAGNOSIS — T148XXA Other injury of unspecified body region, initial encounter: Secondary | ICD-10-CM

## 2018-09-26 NOTE — Progress Notes (Signed)
Subjective: 63 year old female presents the office today for follow-up evaluation of peroneal tendon tearing on the right side.  Overall she states that she is doing much better.  She is back to regular shoe.  She still gets some discomfort and her max pain level is 5/10.  She denies recent injury or trauma since I last saw her.  She states that she had a reaction to using the Voltaren gel and it caused sores in her mouth and discoloration so she stopped using this.  She has no new concerns. Denies any systemic complaints such as fevers, chills, nausea, vomiting. No acute changes since last appointment, and no other complaints at this time.   Objective: AAO x3, NAD DP/PT pulses palpable bilaterally, CRT less than 3 seconds There is still some mild tenderness palpation of the course the peroneal tendon states mostly just inferior to the lateral malleolus and proximal fifth metatarsal base.  Minimal discomfort with eversion.  Overall the tendon appears to be intact.  There is no other area of tenderness identified today. No open lesions or pre-ulcerative lesions.  No pain with calf compression, swelling, warmth, erythema  Assessment: Right peroneal partial tearing; with improvement   Plan: -All treatment options discussed with the patient including all alternatives, risks, complications.  -Overall she is improving.  Subjectively regular shoe but she still tender.  Given her MRI consistent with a tear of the tendinopathy medical history to a Tri-Lock ankle brace today.  I want her to start some range of motion exercises we discussed to start slowly and gradually increase.  Also ice to the area as well.  She was able to -RTC 4 weeks if needed or sooner if any issues are to arise.  Vivi Barrack DPM

## 2018-09-26 NOTE — Telephone Encounter (Signed)
Contacted pt and gave her the below information and scheduled her an appointment on 10/05/18 @ 2:30pm. Lamonte Sakai, April D, CMA

## 2018-09-26 NOTE — Telephone Encounter (Signed)
Will refill BP med, but patient overdue for appt with me. Please call and have patient seen as soon as they can for BP follow up.

## 2018-09-26 NOTE — Patient Instructions (Signed)
Peroneal Tendinopathy Rehab  Ask your health care provider which exercises are safe for you. Do exercises exactly as told by your health care provider and adjust them as directed. It is normal to feel mild stretching, pulling, tightness, or discomfort as you do these exercises, but you should stop right away if you feel sudden pain or your pain gets worse. Do not begin these exercises until told by your health care provider.  Stretching and range of motion exercises  These exercises warm up your muscles and joints and improve the movement and flexibility of your ankle. These exercises also help to relieve pain and stiffness.  Exercise A: Gastroc and soleus, standing  1. Stand on the edge of a step on the balls of your feet. The ball of your foot is on the walking surface, right under your toes.  2. Hold onto the railing for balance.  3. Slowly lift your left / right foot, allowing your body weight to press your left / right heel down over the edge of the step. You should feel a stretch in your left / right calf.  4. Hold this position for __________ seconds.  Repeat __________ times with your left / right knee straight and __________ times with your left / right knee bent. Complete this stretch __________ times per day.  Strengthening exercises  These exercises improve the strength and endurance of your foot and ankle. Endurance is the ability to use your muscles for a long time, even after they get tired.  Exercise B: Dorsiflexors    1. Secure a rubber exercise band or tube to an object, like a table leg, that will not move if it is pulled on.  2. Secure the other end of the band around your left / right foot.  3. Sit on the floor, facing the object with your left / right foot extended. The band or tube should be slightly tense when your foot is relaxed.  4. Slowly flex your left / right ankle and toes to bring your foot toward you.  5. Hold this position for __________ seconds.  6. Slowly return your foot to the  starting position.  Repeat __________ times. Complete this exercise __________ times per day.  Exercise C: Evertors  1. Sit on the floor with your legs straight out in front of you.  2. Loop a rubber exercise or band or tube around the ball of your left / right foot. The ball of your foot is on the walking surface, right under your toes.  3. Hold the ends of the band in your hands, or secure the band to a stable object.  4. Slowly push your foot outward, away from your other leg.  5. Hold this position for __________ seconds.  6. Slowly return your foot to the starting position.  Repeat __________ times. Complete this exercise __________ times per day.  Exercise D: Standing heel raise (plantar flexion)  1. Stand with your feet shoulder-width apart with the balls of your feet on a step. The ball of your foot is on the walking surface, right under your toes.  2. Keep your weight spread evenly over the width of your feet while you rise up on your toes. Use a wall or railing to steady yourself, but try not to use it for support.  3. If this exercise is too easy, try these options:  ? Shift your weight toward your left / right leg until you feel challenged.  ? If told by your health   care provider, stand on your left / right leg only.  4. Hold this position for __________ seconds.  Repeat __________ times. Complete this exercise __________ times per day.  Exercise E: Single leg stand  1. Without shoes, stand near a railing or in a doorway. You may hold onto the railing or door frame as needed.  2. Stand on your left / right foot. Keep your big toe down on the floor and try to keep your arch lifted.  ? Do not roll to the outside of your foot.  ? If this exercise is too easy, you can try it with your eyes closed or while standing on a pillow.  3. Hold this position for __________ seconds.  Repeat __________ times. Complete this exercise __________ times per day.  This information is not intended to replace advice given to  you by your health care provider. Make sure you discuss any questions you have with your health care provider.  Document Released: 07/12/2005 Document Revised: 03/18/2016 Document Reviewed: 05/31/2015  Elsevier Interactive Patient Education © 2019 [employer]

## 2018-09-29 ENCOUNTER — Ambulatory Visit (INDEPENDENT_AMBULATORY_CARE_PROVIDER_SITE_OTHER): Payer: BLUE CROSS/BLUE SHIELD

## 2018-09-29 ENCOUNTER — Ambulatory Visit (HOSPITAL_COMMUNITY)
Admission: EM | Admit: 2018-09-29 | Discharge: 2018-09-29 | Disposition: A | Discharge status: Home / Self Care | Payer: BLUE CROSS/BLUE SHIELD | Attending: Family Medicine | Admitting: Family Medicine

## 2018-09-29 ENCOUNTER — Encounter (HOSPITAL_COMMUNITY): Payer: Self-pay | Admitting: Emergency Medicine

## 2018-09-29 DIAGNOSIS — R062 Wheezing: Secondary | ICD-10-CM

## 2018-09-29 DIAGNOSIS — J40 Bronchitis, not specified as acute or chronic: Secondary | ICD-10-CM

## 2018-09-29 DIAGNOSIS — R05 Cough: Secondary | ICD-10-CM | POA: Diagnosis not present

## 2018-09-29 MED ORDER — PREDNISONE 10 MG PO TABS: 40.0000 mg | ORAL_TABLET | Freq: Every day | ORAL | 0 refills | Status: AC

## 2018-09-29 MED ORDER — ALBUTEROL SULFATE (2.5 MG/3ML) 0.083% IN NEBU: 2.5000 mg | INHALATION_SOLUTION | Freq: Four times a day (QID) | RESPIRATORY_TRACT | 12 refills | Status: DC | PRN

## 2018-09-29 MED ORDER — IPRATROPIUM-ALBUTEROL 0.5-2.5 (3) MG/3ML IN SOLN
3.0000 mL | Freq: Once | RESPIRATORY_TRACT | Status: AC
  Administered 2018-09-29: 3 mL via RESPIRATORY_TRACT

## 2018-09-29 MED ORDER — IPRATROPIUM-ALBUTEROL 0.5-2.5 (3) MG/3ML IN SOLN
RESPIRATORY_TRACT | Status: AC
  Filled 2018-09-29: qty 3

## 2018-09-29 MED ORDER — BENZONATATE 100 MG PO CAPS: 100.0000 mg | ORAL_CAPSULE | Freq: Three times a day (TID) | ORAL | 0 refills | Status: DC

## 2018-09-29 NOTE — ED Notes (Signed)
Nebulizer machine given to patient.

## 2018-09-29 NOTE — Discharge Instructions (Signed)
Your x-ray did not show any pneumonia We will treat you for bronchitis Prednisone daily for 5 days.  Sending you home with a nebulizer machine and solution sent to the pharmacy.  Tessalon pearls for cough.  Follow up as needed for continued or worsening symptoms

## 2018-09-29 NOTE — ED Triage Notes (Signed)
Pt c/o coughing and wheezing x3 days, audible wheezing noted. Pt states shes used her inhaler without relief.

## 2018-09-29 NOTE — ED Provider Notes (Addendum)
[facility]    CSN: [id number] Arrival date & time: 09/29/18  1541     History   Chief Complaint Chief Complaint  Patient presents with  . Cough  . Wheezing    HPI Michelle Rios is a 63 y.o. female.   Pt is a 63 year old female with past medical history of anemia, arthritis, asthma, PE, sarcoidosis, hypertension that presents with cough, congestion and wheezing worsening over the past 3 days. She has been using her albuterol inhaler without relief. She has not been coughing up any mucous.  Denies any associated fevers, chills, nasal congestion, rhinorrhea, sore throat, ear pain.  Denies any recent sick contacts or recent traveling.  ROS per HPI      Past Medical History:  Diagnosis Date  . AKI (acute kidney injury) (HCC) 09/15/2015  . Anemia   . Arthritis    "knees" (02/20/2018)  . Asthma   . Daily headache   . Diarrhea 09/15/2015  . Hiatal hernia   . History of blood transfusion 1979   w/childbirth  . Hypertension   . Pulmonary embolism (HCC) 1990s X 1  . Sarcoidosis of lung [facility])     Patient Active Problem List   Diagnosis Date Noted  . Abdominal pain 02/20/2018  . Diarrhea 02/20/2018  . Unexplained night sweats 02/01/2018  . Rash 02/01/2018  . Anemia 12/06/2017  . HLD (hyperlipidemia) 12/06/2017  . Acute reaction to stress 11/12/2017  . Pain in right knee 09/23/2017  . Essential hypertension 02/09/2017  . Morbid obesity due to excess calories (HCC) 02/09/2017  . Sarcoidosis 02/09/2017  . Right upper quadrant abdominal tenderness   . SOB (shortness of breath)     Past Surgical History:  Procedure Laterality Date  . ANKLE FRACTURE SURGERY Right   . KNEE ARTHROSCOPY Right 1990s  . TUBAL LIGATION  1990s  . VAGINAL HYSTERECTOMY  1990s  . WRIST FRACTURE SURGERY Left 2001    OB History   No obstetric history on file.      Home Medications    Prior to Admission medications   Medication Sig Start Date End Date Taking? Authorizing  Provider  acetaminophen (TYLENOL) 650 MG CR tablet Take 650 mg by mouth every 8 (eight) hours as needed for pain.    [provider]  albuterol (PROVENTIL HFA;VENTOLIN HFA) 108 (90 Base) MCG/ACT inhaler Inhale 2 puffs into the lungs every 4 (four) hours as needed for wheezing or shortness of breath. 05/01/18   Nyoka Cowden, MD  albuterol (PROVENTIL) (2.5 MG/3ML) 0.083% nebulizer solution Take 3 mLs (2.5 mg total) by nebulization every 6 (six) hours as needed for wheezing or shortness of breath. 09/29/18   Letesha Klecker, Gloris Manchester A, NP  benzonatate (TESSALON) 100 MG capsule Take 1 capsule (100 mg total) by mouth every 8 (eight) hours. 09/29/18   Dahlia Byes A, NP  diclofenac sodium (VOLTAREN) 1 % GEL Apply 2 g topically 4 (four) times daily. Rub into affected area of foot 2 to 4 times daily Patient not taking: Reported on 09/29/2018 09/05/18   Vivi Barrack, DPM  ibuprofen (ADVIL,MOTRIN) 600 MG tablet Take 1 tablet (600 mg total) by mouth every 6 (six) hours as needed. 04/01/18   Wieters, Hallie C, PA-C  indapamide (LOZOL) 1.25 MG tablet Take 1.25 mg by mouth 2 (two) times daily.     [provider]  indapamide (LOZOL) 2.5 MG tablet Take 1 tablet by mouth once daily 09/26/18   Garth Bigness, MD  meloxicam (  MOBIC) 15 MG tablet Take 1 tablet (15 mg total) by mouth daily. Patient not taking: Reported on 09/29/2018 08/15/18 08/15/19  Vivi BarrackWagoner, Matthew R, DPM  ondansetron (ZOFRAN ODT) 4 MG disintegrating tablet Take 1 tablet (4 mg total) by mouth every 8 (eight) hours as needed for nausea or vomiting. 08/31/18   Liberty HandyGibbons, Claudia J, PA-C  predniSONE (DELTASONE) 10 MG tablet Take 4 tablets (40 mg total) by mouth daily for 5 days. 09/29/18 10/04/18  Janace ArisBast, Shaynah Hund A, NP  simethicone (MYLICON) 80 MG chewable tablet Chew 1 tablet (80 mg total) by mouth 4 (four) times daily as needed for flatulence. Patient not taking: Reported on 09/29/2018 02/21/18   Shirley, [place], DO    Family History Family History  Problem  Relation Age of Onset  . Heart attack Father   . Diabetes Mother   . Stroke Paternal Grandmother   . Breast cancer Sister   . Diabetes Brother   . Kidney cancer Sister     Social History Social History   Tobacco Use  . Smoking status: Never Smoker  . Smokeless tobacco: Never Used  Substance Use Topics  . Alcohol use: No  . Drug use: No     Allergies   Ivp dye [iodinated diagnostic agents] and Voltaren [diclofenac sodium]   Review of Systems Review of Systems  Constitutional: Positive for activity change. Negative for chills, diaphoresis and unexpected weight change.  Respiratory: Positive for cough, chest tightness, shortness of breath and wheezing.   Cardiovascular: Negative for chest pain, palpitations and leg swelling.     Physical Exam Triage Vital Signs ED Triage Vitals  Enc Vitals Group     BP 09/29/18 1553 (!) 152/85     Pulse Rate 09/29/18 1553 (!) 115     Resp 09/29/18 1553 (!) 22     Temp 09/29/18 1553 99 F (37.2 C)     Temp src --      SpO2 09/29/18 1553 97 %     Weight --      Height --      Head Circumference --      Peak Flow --      Pain Score 09/29/18 1554 0     Pain Loc --      Pain Edu? --      Excl. in GC? --    No data found.  Updated Vital Signs BP 134/84   Pulse (!) 107   Temp 99 F (37.2 C)   Resp (!) 50   SpO2 97%   Visual Acuity Right Eye Distance:   Left Eye Distance:   Bilateral Distance:    Right Eye Near:   Left Eye Near:    Bilateral Near:     Physical Exam Vitals signs and nursing note reviewed.  Constitutional:      General: She is not in acute distress.    Appearance: Normal appearance. She is not ill-appearing, toxic-appearing or diaphoretic.  HENT:     Head: Normocephalic and atraumatic.     Right Ear: Tympanic membrane and ear canal normal.     Left Ear: Tympanic membrane and ear canal normal.     Nose: Nose normal.     Mouth/Throat:     Pharynx: Oropharynx is clear.  Eyes:      Conjunctiva/sclera: Conjunctivae normal.  Neck:     Musculoskeletal: Normal range of motion.  Cardiovascular:     Rate and Rhythm: Regular rhythm. Tachycardia present.     Heart sounds: Normal heart  sounds.  Pulmonary:     Breath sounds: Wheezing present.     Comments: Mild dyspnea Musculoskeletal: Normal range of motion.  Skin:    General: Skin is warm and dry.  Neurological:     Mental Status: She is alert.  Psychiatric:        Mood and Affect: Mood normal.      UC Treatments / Results  Labs (all labs ordered are listed, but only abnormal results are displayed) Labs Reviewed - No data to display  EKG None  Radiology Dg Chest 2 View  Result Date: 09/29/2018 CLINICAL DATA:  Cough and wheezing, short of breath EXAM: CHEST - 2 VIEW COMPARISON:  03/20/2018, 02/02/2018 FINDINGS: Mild chronic interstitial disease. No focal opacity or pleural effusion. Stable cardiomediastinal silhouette with bilateral fullness which may be due to nodes or prominent central pulmonary artery. Tortuous aorta. No pneumothorax. IMPRESSION: No active cardiopulmonary disease. Similar appearance of mild diffuse interstitial opacity and enlarged hilar shadows bilaterally. No acute airspace disease. Electronically Signed   By: Jasmine PangKim  Fujinaga M.D.   On: 09/29/2018 17:04    Procedures Procedures (including critical care time)  Medications Ordered in UC Medications  ipratropium-albuterol (DUONEB) 0.5-2.5 (3) MG/3ML nebulizer solution 3 mL (3 mLs Nebulization Given 09/29/18 1600)    Initial Impression / Assessment and Plan / UC Course  I have reviewed the triage vital signs and the nursing notes.  Pertinent labs & imaging results that were available during my care of the patient were reviewed by me and considered in my medical decision making (see chart for details).     Patient is a 63 year old female with past medical history of asthma, sarcoidosis that presents with 3 days of worsening cough, congestion,  wheezing. DuoNeb given here in clinic with some improvement in lung sounds.  She is  still having diffuse expiratory wheezing. She does have a low-grade fever here in clinic. She reports that she feels slightly better from the breathing treatment X ray did not reveal any PNA We will go ahead and treat for bronchitis. Will have her take prednisone and send home with nebulizer machine. I feel this would work better than her inhaler.   VS rechecked and pt HR decreased and RR decreased HR 107 and RR 20 upon reassessment.  Able to speak in full sentences.  Stable to send home and have her do nebs at home.  Strict precautions and instructions that if her symptoms worsen she needs to go to the hospital Pt understanding and agrees.    Final Clinical Impressions(s) / UC Diagnoses   Final diagnoses:  Bronchitis     Discharge Instructions     Your x-ray did not show any pneumonia We will treat you for bronchitis Prednisone daily for 5 days.  Sending you home with a nebulizer machine and solution sent to the pharmacy.  Tessalon pearls for cough.  Follow up as needed for continued or worsening symptoms     ED Prescriptions    Medication Sig Dispense Auth. Provider   benzonatate (TESSALON) 100 MG capsule Take 1 capsule (100 mg total) by mouth every 8 (eight) hours. 21 capsule Adrianna Dudas A, NP   predniSONE (DELTASONE) 10 MG tablet Take 4 tablets (40 mg total) by mouth daily for 5 days. 20 tablet Argelia Formisano A, NP   albuterol (PROVENTIL) (2.5 MG/3ML) 0.083% nebulizer solution Take 3 mLs (2.5 mg total) by nebulization every 6 (six) hours as needed for wheezing or shortness of breath. 75 mL Jaci LazierBast, Jurline Folger  A, NP     Controlled Substance Prescriptions [place] Controlled Substance Registry consulted? Not Applicable        Janace Aris, NP 09/29/18 1826

## 2018-10-04 ENCOUNTER — Telehealth: Payer: Self-pay | Admitting: *Deleted

## 2018-10-04 NOTE — Telephone Encounter (Signed)
Received message from front office stating I needed to call pt and explain to her that our office no longer accepts [facility].  I told her to check to see who was in her network and to establish with one of them.Michelle Rios, Michelle Rios D, [place]

## 2018-10-05 ENCOUNTER — Ambulatory Visit: Payer: BLUE CROSS/BLUE SHIELD | Admitting: Family Medicine

## 2018-10-24 ENCOUNTER — Ambulatory Visit: Payer: BLUE CROSS/BLUE SHIELD | Admitting: Podiatry

## 2019-10-11 IMAGING — MR MR ANKLE*R* W/O CM
4 of 6 series · 13 of 40 positions shown · non-contrast
Comparison: None.

CLINICAL DATA: Right ankle pain.

EXAM:
MRI OF THE RIGHT ANKLE WITHOUT CONTRAST
TECHNIQUE: Multiplanar, multisequence MR imaging of the ankle was performed. No
intravenous contrast was administered.

[Series 3: PD fat-sat · axial · right · 3.0mm · 0.25mm/px · z∈[-119,-8]mm · 4 of 34 slices shown]
[im 1/34]
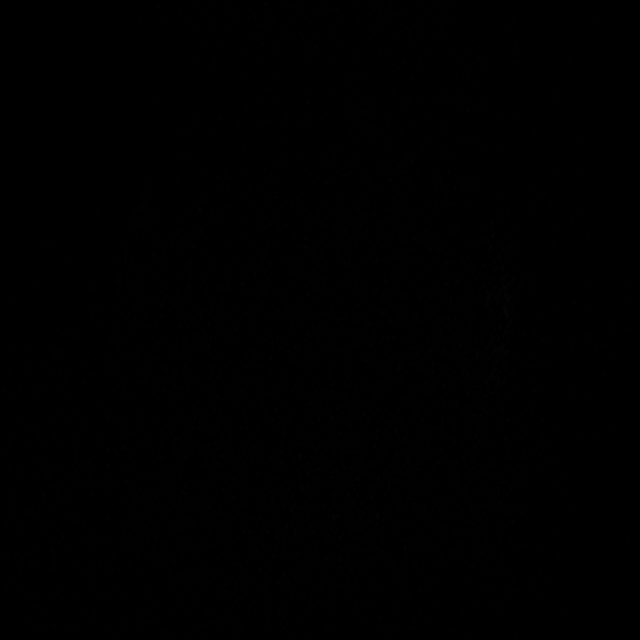
[im 5/34]
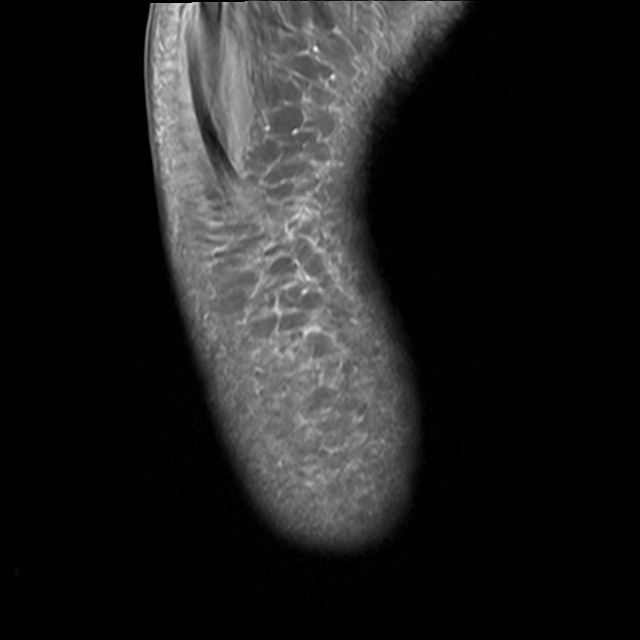
[im 19/34]
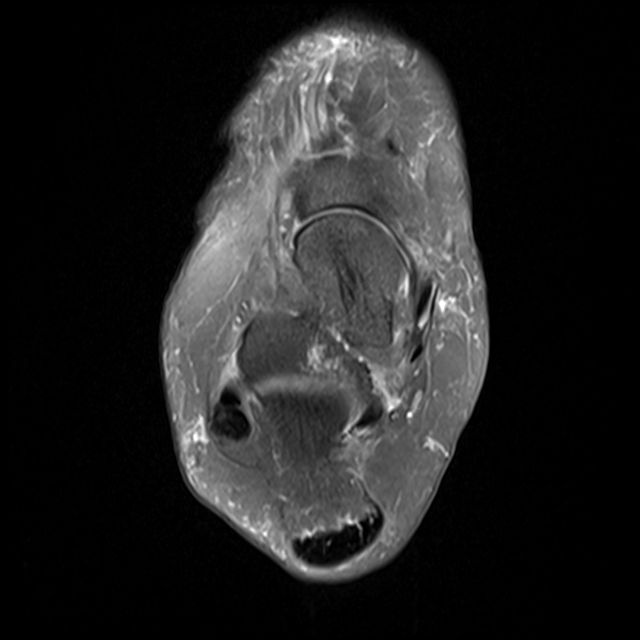
[im 29/34]
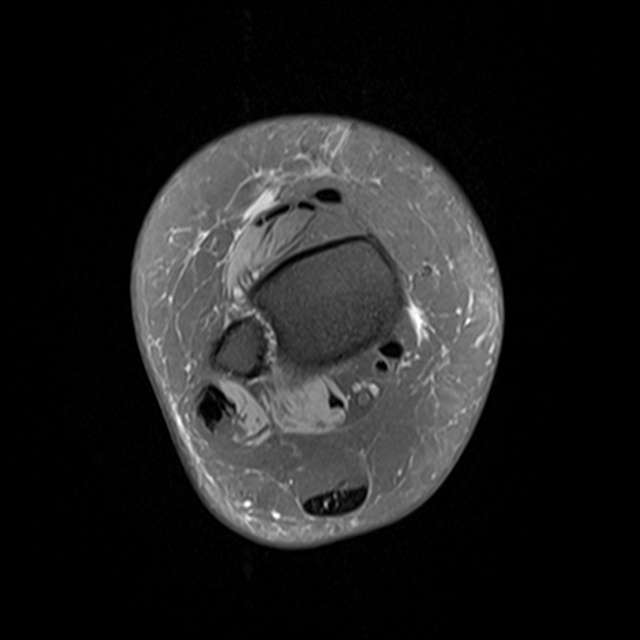

[Series 4: T2 fat-sat · axial · right · 3.0mm · 0.25mm/px · z∈[-99,-12]mm · 3 of 34 slices shown (1 of 2)]
[im 6/34]
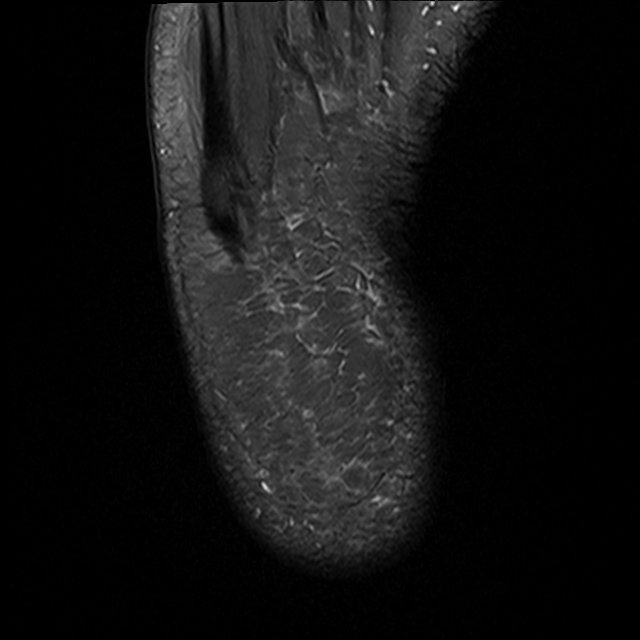
[im 17/34]
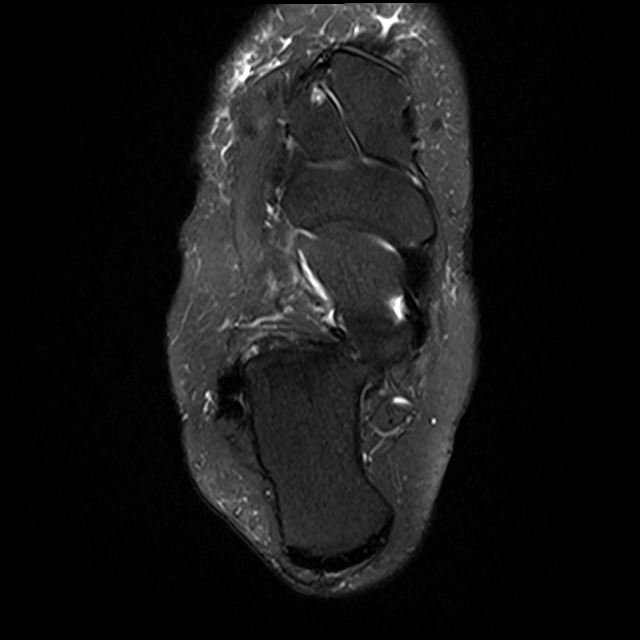
[im 28/34]
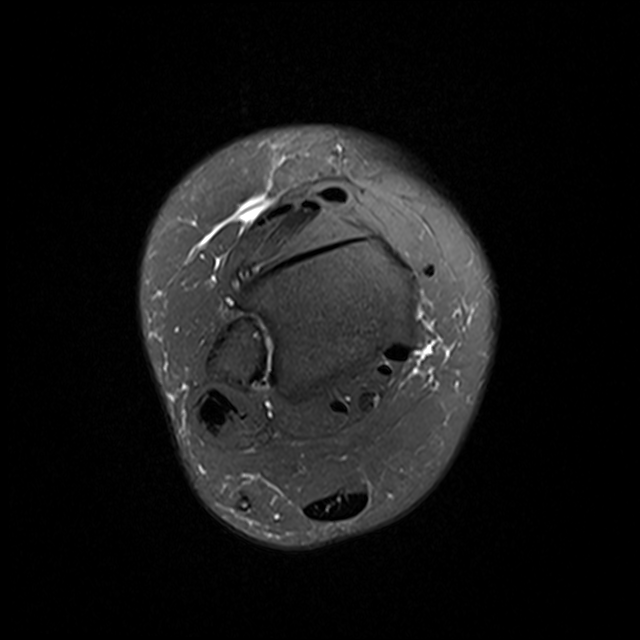

[Series 5: T1 · sagittal · right · 4.0mm · 0.27mm/px · 3 of 26 slices shown]
[im 1/26]
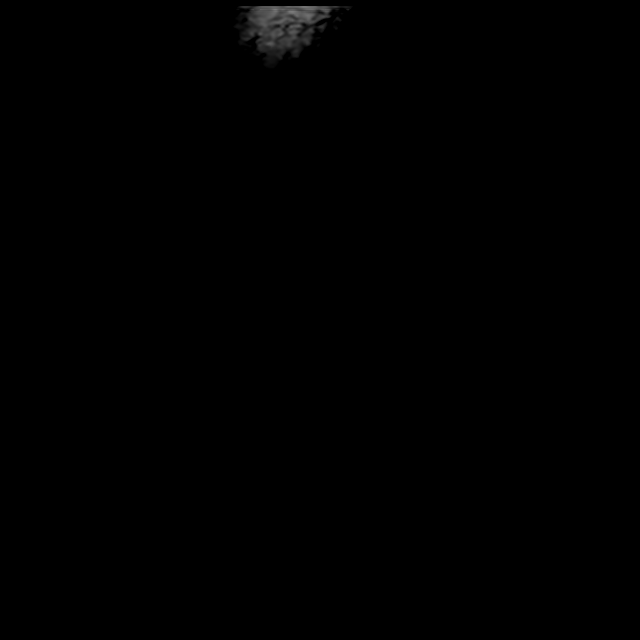
[im 13/26]
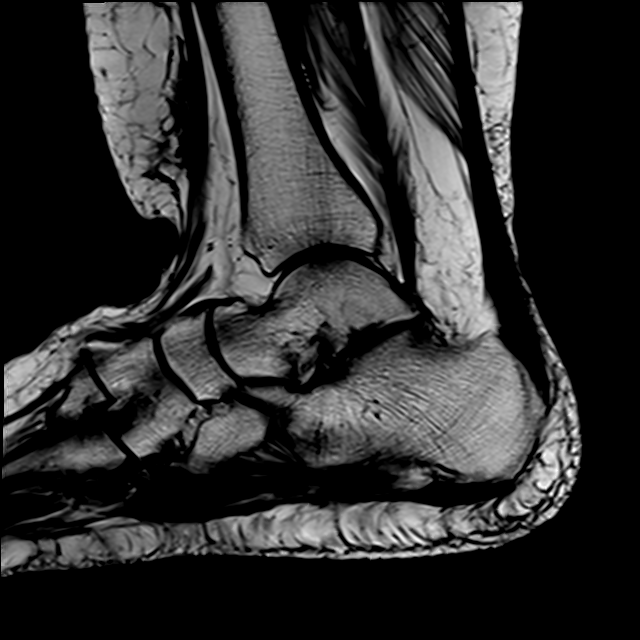
[im 26/26]
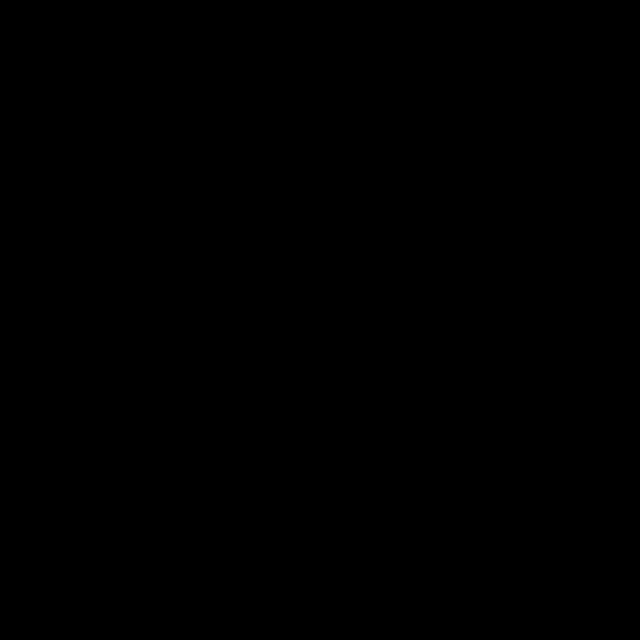

[Series 7: T2 fat-sat · coronal · right · 3.0mm · 0.25mm/px · 3 of 41 slices shown (2 of 2)]
[im 6/41]
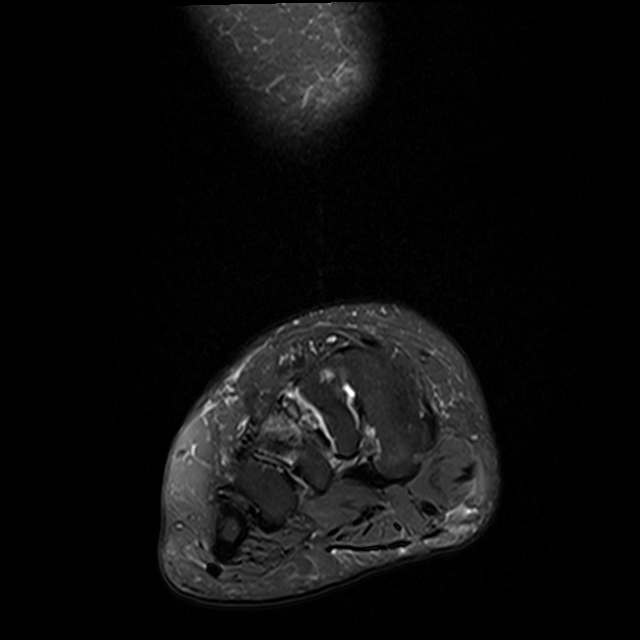
[im 23/41]
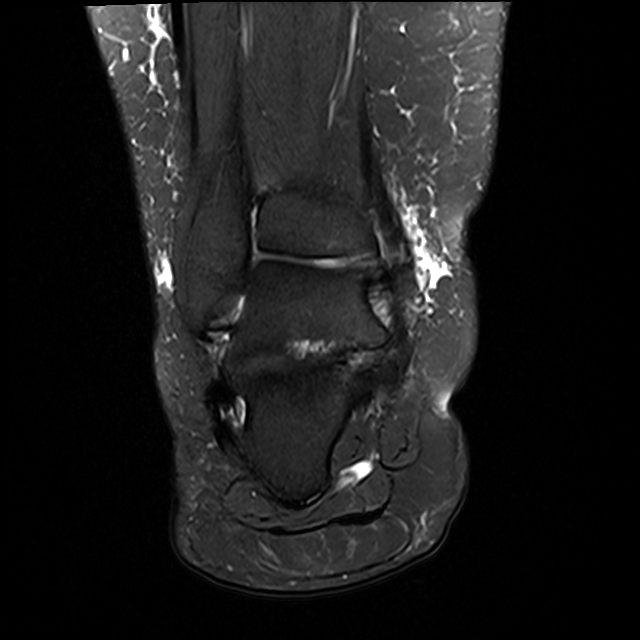
[im 35/41]
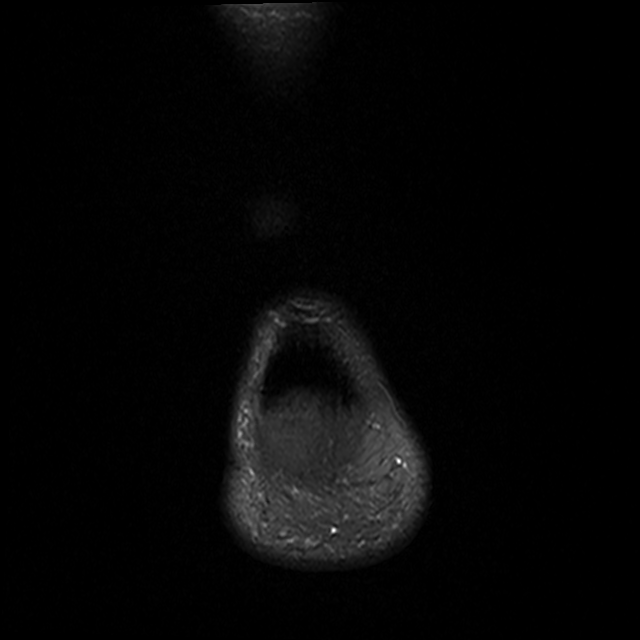

[13 of 40 positions shown; findings below may reference images not displayed]

FINDINGS: TENDONS

Peroneal: Severe tendinosis of the peroneus longus with a
interstitial tear at the level of the lateral malleolus. Peroneal
brevis intact.

Posteromedial: Posterior tibial tendon intact. Flexor hallucis
longus tendon intact. Flexor digitorum longus tendon intact.

Anterior: Tibialis anterior tendon intact. Extensor hallucis longus
tendon intact Extensor digitorum longus tendon intact.

Achilles: Minimal linear T2 and T1 hyperintense signal within the
distal Achilles tendon which may reflect prominent fasciculation and
fat deposition versus mild tendinosis.

Plantar Fascia: Intact.

LIGAMENTS

Lateral: Anterior talofibular ligament intact. Calcaneofibular
ligament intact. Posterior talofibular ligament intact. Anterior and
posterior tibiofibular ligaments intact.

Medial: Deltoid ligament intact. Spring ligament intact.

CARTILAGE

Ankle Joint: No joint effusion. Normal ankle mortise. No chondral
defect.

Subtalar Joints/Sinus Tarsi: Mild osteoarthritis of the posterior
subtalar joint with mild subchondral marrow edema in the talus. No
subtalar joint effusion. Normal sinus tarsi.

Bones: Severe osteoarthritis of the third TMT joint with subchondral
marrow edema. No aggressive osseous lesion. No acute fracture or
dislocation.

Soft Tissue: No fluid collection or hematoma.
IMPRESSION: 1. Severe tendinosis of the peroneus longus with a interstitial tear
at the level of the lateral malleolus.
2. Severe osteoarthritis of the third TMT joint.

## 2019-12-21 IMAGING — US US ABDOMEN COMPLETE
1 series · 14 of 25 positions shown · non-contrast
Comparison: 09/16/2015

CLINICAL DATA: Epigastric pain for 2 weeks.  Diarrhea

EXAM:
ABDOMEN ULTRASOUND COMPLETE

[Series 1: us abdomen complete · 0.21mm/px · 14 of 97 slices shown]
[im 1/97]
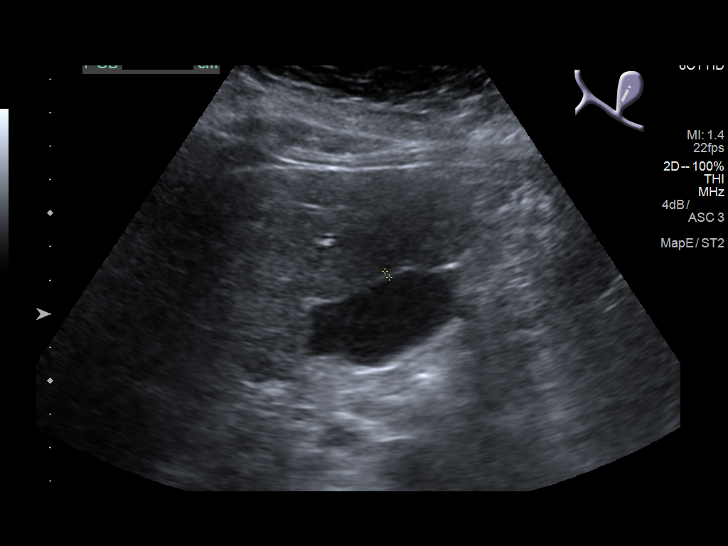
[im 9/97]
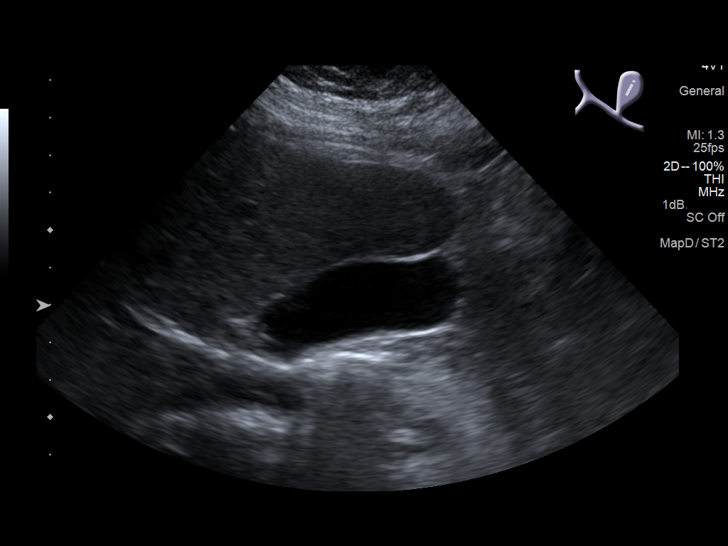
[im 17/97]
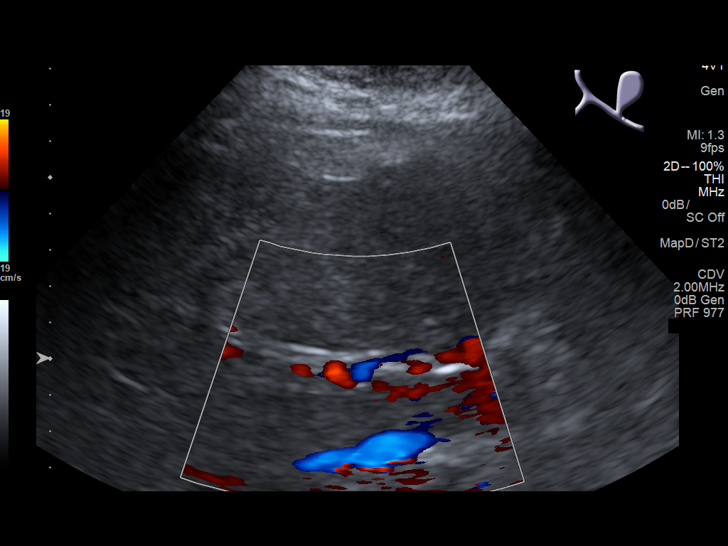
[im 25/97]
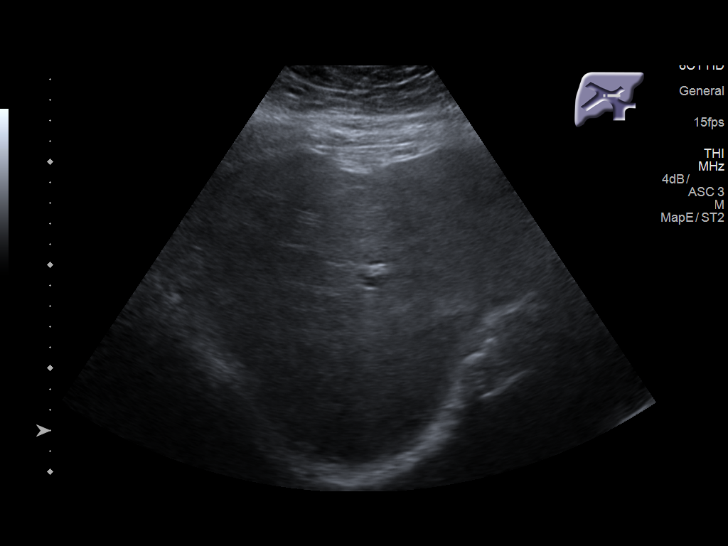
[im 33/97]
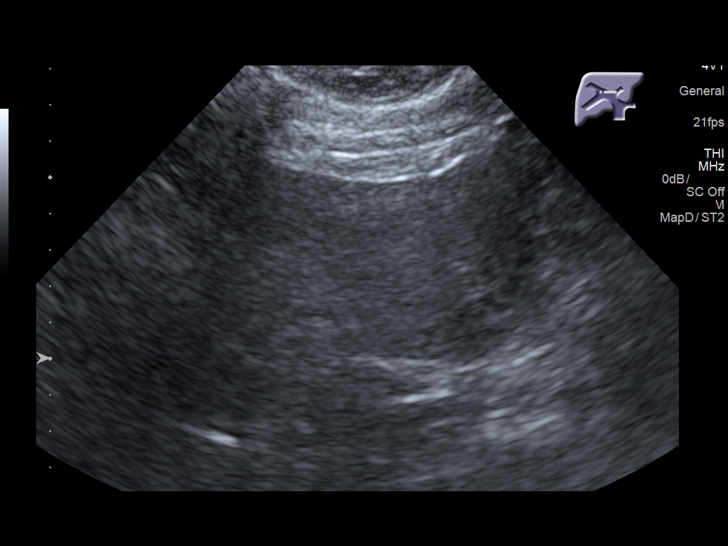
[im 37/97]
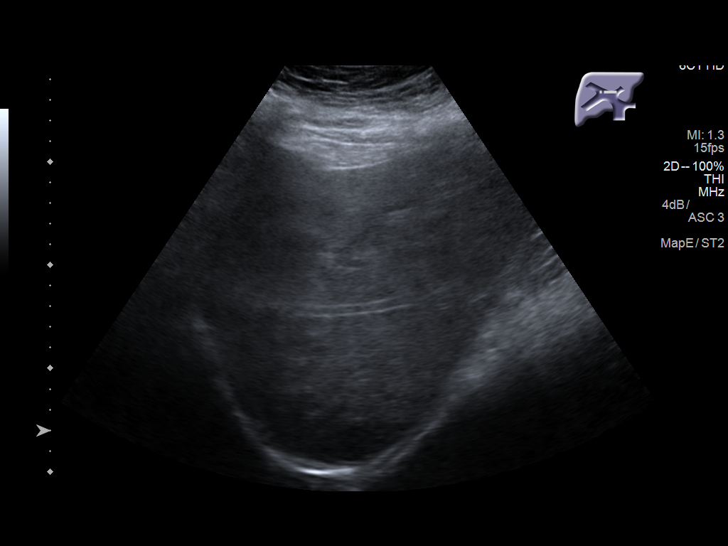
[im 45/97]
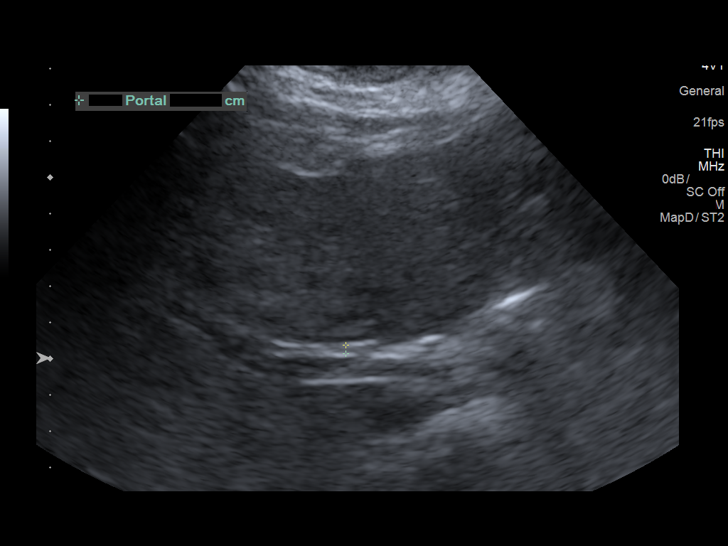
[im 53/97]
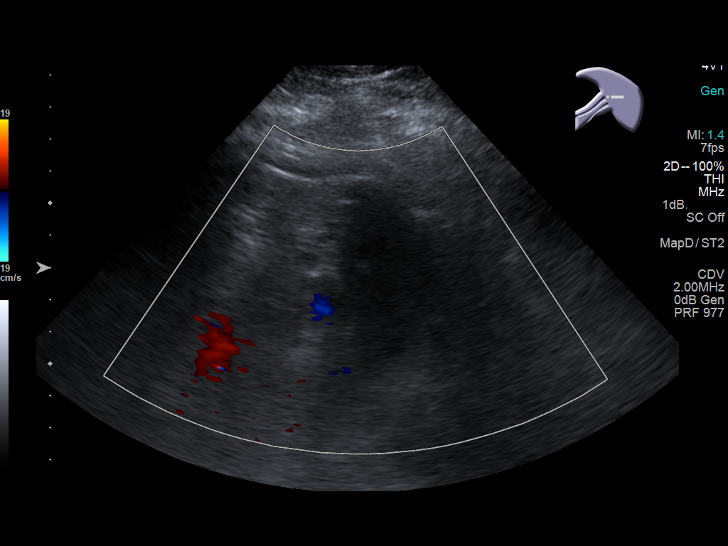
[im 61/97]
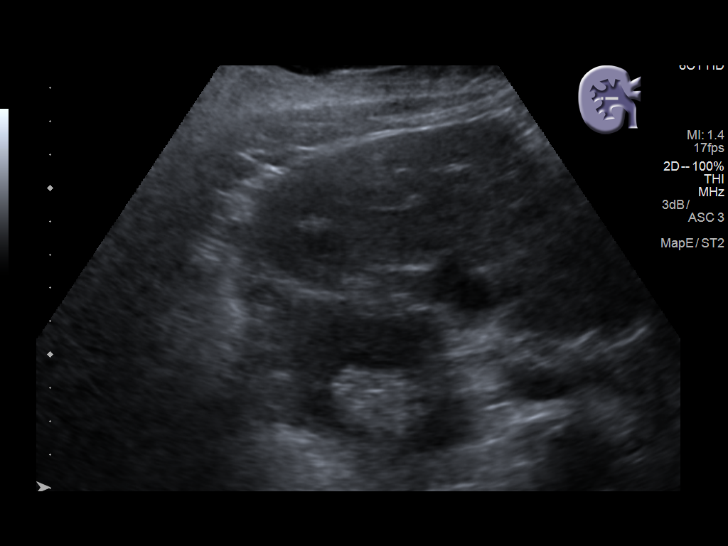
[im 65/97]
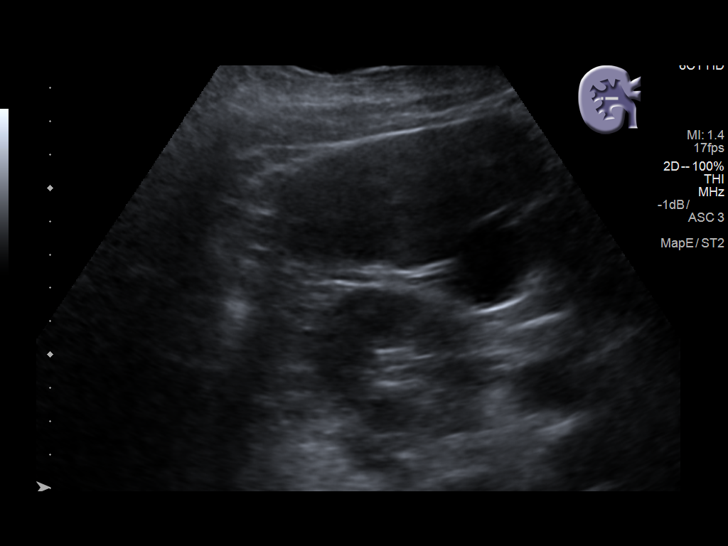
[im 73/97]
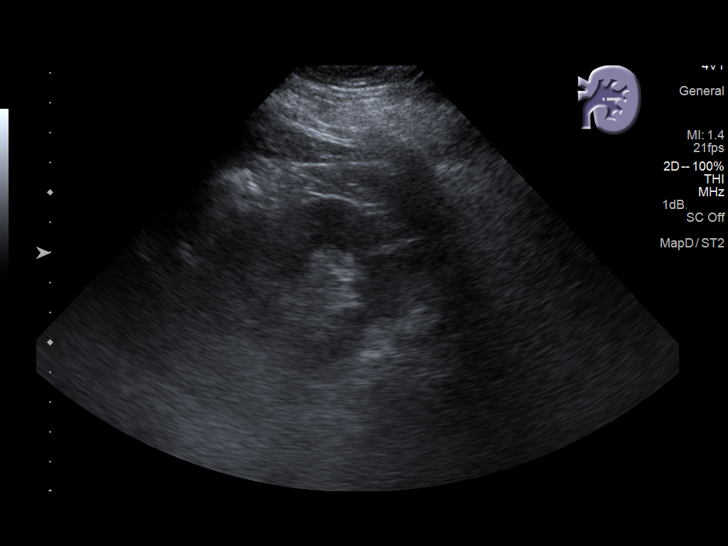
[im 81/97]
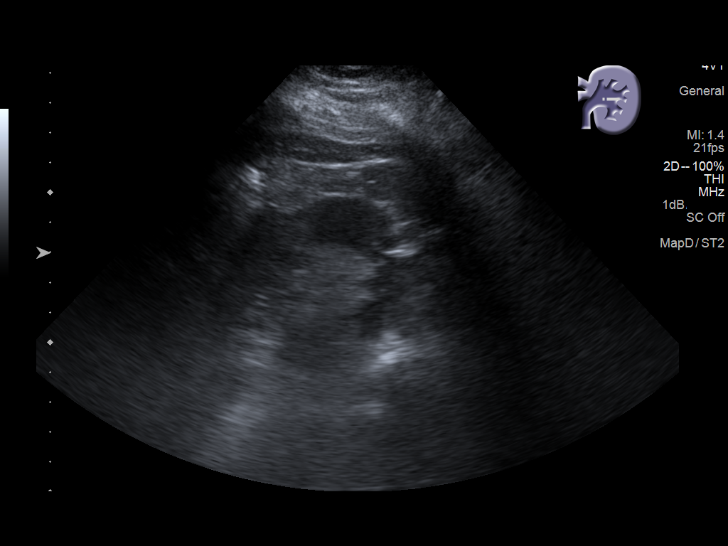
[im 89/97]
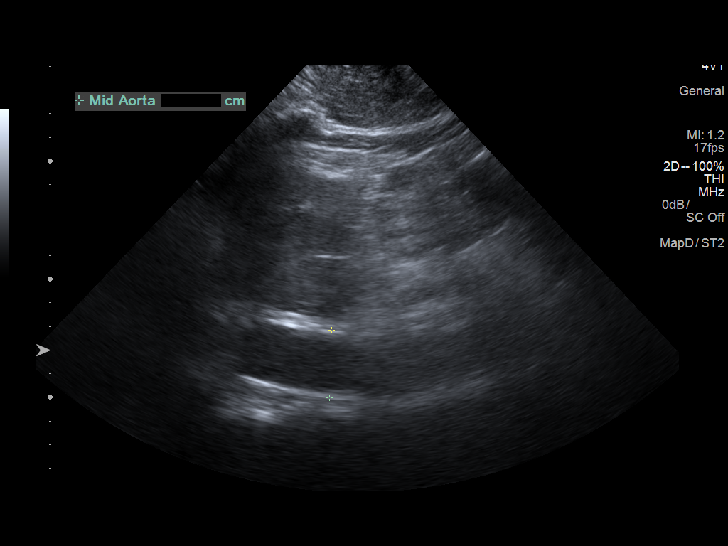
[im 97/97]
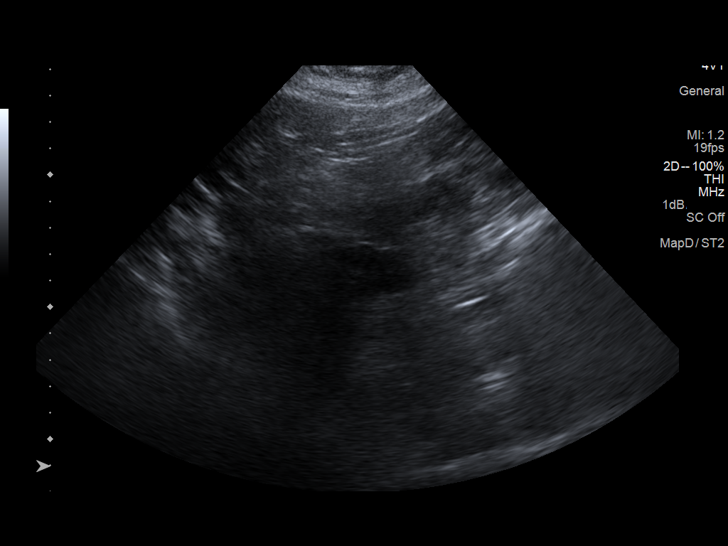

[14 of 25 positions shown; findings below may reference images not displayed]

FINDINGS: Gallbladder: No gallstones or wall thickening visualized. No
sonographic Murphy sign noted by sonographer.

Common bile duct: Diameter: 2 mm

Liver: No focal lesion identified. Within normal limits in
parenchymal echogenicity. Portal vein is patent on color Doppler
imaging with normal direction of blood flow towards the liver.

IVC: No abnormality visualized.

Pancreas: Limited visualization.

Spleen: Size and appearance within normal limits.

Right Kidney: Length: 10 cm. Echogenicity within normal limits. No
mass or hydronephrosis visualized.

Left Kidney: Length: 11 cm. Echogenicity within normal limits. No
mass or hydronephrosis visualized.

Abdominal aorta: No aneurysm visualized.
IMPRESSION: 1. No acute finding.
2. The pancreas could not be visualized.

## 2020-12-20 ENCOUNTER — Ambulatory Visit (HOSPITAL_COMMUNITY)
Admission: EM | Admit: 2020-12-20 | Discharge: 2020-12-20 | Disposition: A | Discharge status: Home / Self Care | Payer: Medicare Other

## 2020-12-20 ENCOUNTER — Encounter (HOSPITAL_COMMUNITY): Payer: Self-pay | Admitting: Emergency Medicine

## 2020-12-20 ENCOUNTER — Emergency Department (HOSPITAL_COMMUNITY)
Admission: EM | Admit: 2020-12-20 | Discharge: 2020-12-20 | Disposition: A | Discharge status: Home / Self Care | Payer: Medicare Other | Attending: Emergency Medicine | Admitting: Emergency Medicine

## 2020-12-20 ENCOUNTER — Other Ambulatory Visit: Payer: Self-pay

## 2020-12-20 ENCOUNTER — Emergency Department (HOSPITAL_COMMUNITY): Payer: Medicare Other

## 2020-12-20 DIAGNOSIS — R519 Headache, unspecified: Secondary | ICD-10-CM | POA: Diagnosis not present

## 2020-12-20 DIAGNOSIS — J45909 Unspecified asthma, uncomplicated: Secondary | ICD-10-CM | POA: Insufficient documentation

## 2020-12-20 DIAGNOSIS — I10 Essential (primary) hypertension: Secondary | ICD-10-CM | POA: Diagnosis not present

## 2020-12-20 LAB — CBC WITH DIFFERENTIAL/PLATELET
Abs Immature Granulocytes: 0.02 10*3/uL (ref 0.00–0.07)
Basophils Absolute: 0 10*3/uL (ref 0.0–0.1)
Basophils Relative: 0 %
Eosinophils Absolute: 0.1 10*3/uL (ref 0.0–0.5)
Eosinophils Relative: 1 %
HCT: 33.5 % — ABNORMAL LOW (ref 36.0–46.0)
Hemoglobin: 11.4 g/dL — ABNORMAL LOW (ref 12.0–15.0)
Immature Granulocytes: 0 %
Lymphocytes Relative: 30 %
Lymphs Abs: 1.5 10*3/uL (ref 0.7–4.0)
MCH: 33.9 pg (ref 26.0–34.0)
MCHC: 34 g/dL (ref 30.0–36.0)
MCV: 99.7 fL (ref 80.0–100.0)
Monocytes Absolute: 0.4 10*3/uL (ref 0.1–1.0)
Monocytes Relative: 8 %
Neutro Abs: 3 10*3/uL (ref 1.7–7.7)
Neutrophils Relative %: 61 %
Platelets: 300 10*3/uL (ref 150–400)
RBC: 3.36 MIL/uL — ABNORMAL LOW (ref 3.87–5.11)
RDW: 11.7 % (ref 11.5–15.5)
WBC: 5 10*3/uL (ref 4.0–10.5)
nRBC: 0 % (ref 0.0–0.2)

## 2020-12-20 LAB — COMPREHENSIVE METABOLIC PANEL
ALT: 30 U/L (ref 0–44)
AST: 27 U/L (ref 15–41)
Albumin: 3.7 g/dL (ref 3.5–5.0)
Alkaline Phosphatase: 78 U/L (ref 38–126)
Anion gap: 7 (ref 5–15)
BUN: 15 mg/dL (ref 8–23)
CO2: 28 mmol/L (ref 22–32)
Calcium: 9.2 mg/dL (ref 8.9–10.3)
Chloride: 102 mmol/L (ref 98–111)
Creatinine, Ser: 0.71 mg/dL (ref 0.44–1.00)
GFR, Estimated: >60 mL/min (ref >60)
Glucose, Bld: 94 mg/dL (ref 70–99)
Potassium: 4.1 mmol/L (ref 3.5–5.1)
Sodium: 137 mmol/L (ref 135–145)
Total Bilirubin: 0.5 mg/dL (ref 0.3–1.2)
Total Protein: 7.2 g/dL (ref 6.5–8.1)

## 2020-12-20 LAB — TROPONIN I (HIGH SENSITIVITY): Troponin I (High Sensitivity): 4 ng/L (ref <18)

## 2020-12-20 LAB — C-REACTIVE PROTEIN: CRP: <0.5 mg/dL (ref <1.0)

## 2020-12-20 LAB — SEDIMENTATION RATE: Sed Rate: 45 mm/hr — ABNORMAL HIGH (ref 0–22)

## 2020-12-20 MED ORDER — METOCLOPRAMIDE HCL 5 MG/ML IJ SOLN
10.0000 mg | Freq: Once | INTRAMUSCULAR | Status: AC
  Administered 2020-12-20: 10 mg via INTRAVENOUS
  Filled 2020-12-20: qty 2

## 2020-12-20 MED ORDER — LABETALOL HCL 5 MG/ML IV SOLN
5.0000 mg | Freq: Once | INTRAVENOUS | Status: AC
  Administered 2020-12-20: 5 mg via INTRAVENOUS
  Filled 2020-12-20: qty 4

## 2020-12-20 MED ORDER — AMLODIPINE BESYLATE 10 MG PO TABS: 10.0000 mg | ORAL_TABLET | Freq: Every day | ORAL | 1 refills | Status: DC

## 2020-12-20 MED ORDER — KETOROLAC TROMETHAMINE 15 MG/ML IJ SOLN
15.0000 mg | Freq: Once | INTRAMUSCULAR | Status: AC
  Administered 2020-12-20: 15 mg via INTRAVENOUS
  Filled 2020-12-20: qty 1

## 2020-12-20 MED ORDER — DIPHENHYDRAMINE HCL 25 MG PO CAPS
50.0000 mg | ORAL_CAPSULE | Freq: Once | ORAL | Status: AC
  Administered 2020-12-20: 50 mg via ORAL
  Filled 2020-12-20: qty 2

## 2020-12-20 MED ORDER — TETRACAINE HCL 0.5 % OP SOLN
2.0000 [drp] | Freq: Once | OPHTHALMIC | Status: AC
  Administered 2020-12-20: 2 [drp] via OPHTHALMIC
  Filled 2020-12-20: qty 4

## 2020-12-20 MED ORDER — MORPHINE SULFATE (PF) 4 MG/ML IV SOLN
4.0000 mg | Freq: Once | INTRAVENOUS | Status: AC
  Administered 2020-12-20: 4 mg via INTRAVENOUS
  Filled 2020-12-20: qty 1

## 2020-12-20 MED ORDER — FENTANYL CITRATE (PF) 100 MCG/2ML IJ SOLN
50.0000 ug | Freq: Once | INTRAMUSCULAR | Status: AC
  Administered 2020-12-20: 50 ug via INTRAVENOUS
  Filled 2020-12-20: qty 2

## 2020-12-20 NOTE — Discharge Instructions (Signed)
You were evaluated in the Emergency Department and after careful evaluation, we did not find any emergent condition requiring admission or further testing in the hospital.  It is very important that you start taking your blood pressure medicine as directed.  It is also very important that you establish with a primary care doctor.  Please call the phone number in your discharge paperwork in order to establish with 1.  You will need reevaluation of your blood pressures and possibly further management of your blood pressure medicines.  Please return to the Emergency Department if you experience any worsening of your condition.  We encourage you to follow up with a primary care provider.  Thank you for allowing [place] to be a part of your care.

## 2020-12-20 NOTE — ED Provider Notes (Signed)
[facility]-EMERGENCY DEPT Provider Note   CSN: [id number] Arrival date & time: 12/20/20  1232     History Chief Complaint  Patient presents with  . Headache    Michelle Rios is a 65 y.o. female presenting for evaluation of head pain.  Patient states around 8:00 today she developed severe acute onset right-sided headache.  It is mostly in her temple.  She was already awake at the time. She has associated eye pain.  No associated dizziness, lightheadedness, vision changes, slurred speech, numbness, tingling.  No chest pain or shortness of breath.  She was seen at urgent care, found to have extremely elevated blood pressure.  She has a history of hypertension, used to be on medication but has not taken it for several years, she states she was doing better.  She does not check her blood pressure regularly.  Patient states she had some blurry vision the other day, but none today.   Additional history obtained from chart review.  History of anemia, headache, hypertension, sarcoidosis.   HPI     Past Medical History:  Diagnosis Date  . AKI (acute kidney injury) (HCC) 09/15/2015  . Anemia   . Arthritis    "knees" (02/20/2018)  . Asthma   . Daily headache   . Diarrhea 09/15/2015  . Hiatal hernia   . History of blood transfusion 1979   w/childbirth  . Hypertension   . Pulmonary embolism (HCC) 1990s X 1  . Sarcoidosis of lung [facility])     Patient Active Problem List   Diagnosis Date Noted  . Abdominal pain 02/20/2018  . Diarrhea 02/20/2018  . Unexplained night sweats 02/01/2018  . Rash 02/01/2018  . Anemia 12/06/2017  . HLD (hyperlipidemia) 12/06/2017  . Acute reaction to stress 11/12/2017  . Pain in right knee 09/23/2017  . Essential hypertension 02/09/2017  . Morbid obesity due to excess calories (HCC) 02/09/2017  . Sarcoidosis 02/09/2017  . Right upper quadrant abdominal tenderness   . SOB (shortness of breath)     Past Surgical History:   Procedure Laterality Date  . ANKLE FRACTURE SURGERY Right   . KNEE ARTHROSCOPY Right 1990s  . TUBAL LIGATION  1990s  . VAGINAL HYSTERECTOMY  1990s  . WRIST FRACTURE SURGERY Left 2001     OB History   No obstetric history on file.     Family History  Problem Relation Age of Onset  . Heart attack Father   . Diabetes Mother   . Stroke Paternal Grandmother   . Breast cancer Sister   . Diabetes Brother   . Kidney cancer Sister     Social History   Tobacco Use  . Smoking status: Never Smoker  . Smokeless tobacco: Never Used  Vaping Use  . Vaping Use: Never used  Substance Use Topics  . Alcohol use: No  . Drug use: No    Home Medications Prior to Admission medications   Medication Sig Start Date End Date Taking? Authorizing Provider  acetaminophen (TYLENOL) 650 MG CR tablet Take 650 mg by mouth every 8 (eight) hours as needed for pain.    [provider]  albuterol (PROVENTIL HFA;VENTOLIN HFA) 108 (90 Base) MCG/ACT inhaler Inhale 2 puffs into the lungs every 4 (four) hours as needed for wheezing or shortness of breath. Patient not taking: Reported on 12/20/2020 05/01/18   Nyoka Cowden, MD  albuterol (PROVENTIL) (2.5 MG/3ML) 0.083% nebulizer solution Take 3 mLs (2.5 mg total) by nebulization every 6 (six) hours  as needed for wheezing or shortness of breath. Patient not taking: Reported on 12/20/2020 09/29/18   Dahlia Byes A, NP  benzonatate (TESSALON) 100 MG capsule Take 1 capsule (100 mg total) by mouth every 8 (eight) hours. Patient not taking: Reported on 12/20/2020 09/29/18   Dahlia Byes A, NP  diclofenac sodium (VOLTAREN) 1 % GEL Apply 2 g topically 4 (four) times daily. Rub into affected area of foot 2 to 4 times daily Patient not taking: Reported on 09/29/2018 09/05/18   Vivi Barrack, DPM  ibuprofen (ADVIL,MOTRIN) 600 MG tablet Take 1 tablet (600 mg total) by mouth every 6 (six) hours as needed. Patient not taking: Reported on 12/20/2020 04/01/18   Wieters, Hallie  C, PA-C  indapamide (LOZOL) 1.25 MG tablet Take 1.25 mg by mouth 2 (two) times daily.  Patient not taking: Reported on 12/20/2020    [provider]  indapamide (LOZOL) 2.5 MG tablet Take 1 tablet by mouth once daily Patient not taking: Reported on 12/20/2020 09/26/18   Shon Hale, MD  ondansetron (ZOFRAN ODT) 4 MG disintegrating tablet Take 1 tablet (4 mg total) by mouth every 8 (eight) hours as needed for nausea or vomiting. Patient not taking: Reported on 12/20/2020 08/31/18   Liberty Handy, PA-C  simethicone [facility]) 80 MG chewable tablet Chew 1 tablet (80 mg total) by mouth 4 (four) times daily as needed for flatulence. Patient not taking: Reported on 09/29/2018 02/21/18   Shirley, [place], DO    Allergies    Ivp dye [iodinated diagnostic agents] and Voltaren [diclofenac sodium]  Review of Systems   Review of Systems  Eyes: Positive for visual disturbance ("the other day" blurry vision, resolved).  Neurological: Positive for headaches.  All other systems reviewed and are negative.   Physical Exam Updated Vital Signs BP (!) 174/122   Pulse 68   Temp 98.2 F (36.8 C) (Oral)   Resp 15   SpO2 99%   Physical Exam Vitals and nursing note reviewed.  Constitutional:      General: She is not in acute distress.    Appearance: She is well-developed. She is obese.     Comments: Resting in the bed in NAD  HENT:     Head: Normocephalic and atraumatic.  Eyes:     Extraocular Movements: Extraocular movements intact.     Conjunctiva/sclera: Conjunctivae normal.     Pupils: Pupils are equal, round, and reactive to light.     Comments: EOMI, PERRLA. No gross visual field deficits. IOP of R eye 23/24, IOP of L eye 19/20 TTP of the R temple  Cardiovascular:     Rate and Rhythm: Normal rate and regular rhythm.     Pulses: Normal pulses.  Pulmonary:     Effort: Pulmonary effort is normal. No respiratory distress.     Breath sounds: Normal breath sounds. No wheezing.   Abdominal:     General: There is no distension.     Palpations: Abdomen is soft. There is no mass.     Tenderness: There is no abdominal tenderness. There is no guarding or rebound.  Musculoskeletal:        General: Normal range of motion.     Cervical back: Normal range of motion and neck supple.  Skin:    General: Skin is warm and dry.     Capillary Refill: Capillary refill takes less than 2 seconds.  Neurological:     Mental Status: She is alert and oriented to person, place, and time.  ED Results / Procedures / Treatments   Labs (all labs ordered are listed, but only abnormal results are displayed) Labs Reviewed  CBC WITH DIFFERENTIAL/PLATELET - Abnormal; Notable for the following components:      Result Value   RBC 3.36 (*)    Hemoglobin 11.4 (*)    HCT 33.5 (*)    All other components within normal limits  COMPREHENSIVE METABOLIC PANEL  SEDIMENTATION RATE  C-REACTIVE PROTEIN  TROPONIN I (HIGH SENSITIVITY)    EKG EKG Interpretation  Date/Time:  Saturday Dec 20 2020 13:26:20 EDT Ventricular Rate:  70 PR Interval:  177 QRS Duration: 88 QT Interval:  418 QTC Calculation: 451 R Axis:   -8 Text Interpretation: Sinus rhythm Low voltage, precordial leads Abnormal R-wave progression, early transition no acute ST/T changes Confirmed by Pricilla Loveless [id number]) on 12/20/2020 1:31:35 PM   Radiology CT Head Wo Contrast  Result Date: 12/20/2020 CLINICAL DATA:  Headache for several days with blurry vision. Hypertension. EXAM: CT HEAD WITHOUT CONTRAST TECHNIQUE: Contiguous axial images were obtained from the base of the skull through the vertex without intravenous contrast. COMPARISON:  None. FINDINGS: Brain: No evidence of acute infarction, hemorrhage, hydrocephalus, extra-axial collection or mass lesion/mass effect. Vascular: No hyperdense vessel or unexpected calcification. Skull: Normal. Negative for fracture or focal lesion. Sinuses/Orbits: Globes and orbits are  unremarkable. Visualized sinuses are clear. Other: None. IMPRESSION: Negative unenhanced CT scan of the brain. Electronically Signed   By: Amie Portland M.D.   On: 12/20/2020 13:54    Procedures Procedures   Medications Ordered in ED Medications  tetracaine (PONTOCAINE) 0.5 % ophthalmic solution 2 drop (2 drops Both Eyes Given by Other 12/20/20 1350)  morphine 4 MG/ML injection 4 mg (4 mg Intravenous Given 12/20/20 1409)  metoCLOPramide (REGLAN) injection 10 mg (10 mg Intravenous Given 12/20/20 1419)  diphenhydrAMINE (BENADRYL) capsule 50 mg (50 mg Oral Given 12/20/20 1419)    ED Course  I have reviewed the triage vital signs and the nursing notes.  Pertinent labs & imaging results that were available during my care of the patient were reviewed by me and considered in my medical decision making (see chart for details).    MDM Rules/Calculators/A&P                          Patient presenting for evaluation of headache.  On exam, patient appears nontoxic.  She is found to be extremely hypertensive with a systolic blood pressure in the 200s.  No obvious neurologic deficits, doubt stroke.  Less likely intracranial bleed, however will obtain head CT in the setting of severe head pain.  Consider GCA versus acute angle glaucoma, labs ordered and will measure IOP's.  Consider migraine versus hypertensive emergency.  Will treat for pain and reassess.  CT head negative for acute findings. As I have very low suspicion for intracranial bleed, I do not believe she is lumbar puncture at this time. Case discussed with attending, Dr. Criss Alvine evaluated the pt.  IOPs equal bilaterally.  Labs interpreted by me, overall reassuring.  No sign of endorgan damage at this time.  On reevaluation after headache cocktail, patient reports mild improvement of symptoms, however she still has a lot of pain and is still very hypertensive.  Will treat further with antihypertensives and pain control.   Pt signed out to Carma Lair, PA-C for f/u on BP and pain control.   Final Clinical Impression(s) / ED Diagnoses Final diagnoses:  None  Rx / DC Orders ED Discharge Orders    None       Alveria Apley, PA-C 12/20/20 1521    Pricilla Loveless, MD 12/21/20 [phone]

## 2020-12-20 NOTE — ED Notes (Signed)
John, pa spoke to patient in the intake room.  Instructed to go to ED

## 2020-12-20 NOTE — ED Triage Notes (Signed)
Patient woke with sharp pain in the right side of her head. No nausea, no dizziness.  No changes in vision.  No runny nose or and new cough

## 2020-12-20 NOTE — ED Triage Notes (Signed)
Patient reports headache since waking at 0800 today. Sent from [facility] for further evaluation of headache and hypertension. No PCP at this time, states ran out of BP medications 2-3 years ago and not been seen since that time.

## 2020-12-20 NOTE — ED Notes (Signed)
Patient is being discharged from the Urgent Care and sent to the Emergency Department via pov . Per Jonny Ruiz, [name], patient is in need of higher level of care due to extreme headache and hypertension. Patient is aware and verbalizes understanding of plan of care.  Vitals:   12/20/20 1144  BP: (!) 177/102  Pulse: 69  Resp: 20  Temp: 98.8 F (37.1 C)  SpO2: 100%

## 2020-12-20 NOTE — ED Provider Notes (Signed)
Care of the patient received from [place] PA-C.  Please see her note for full HPI  In short, 65 year old female who presented with development of acute right-sided temporal headache.  Seen in urgent care was noted to be hypertensive.  Work-up by prior provider overall reassuring, CT of the head without any evidence of intracranial bleed, she does have a listed IV dye allergy, however suspicion for intracranial bleed is low as she had no other focal neurologic deficits.  Intraocular pressures were normal, eye exam was reassuring.  Prior provider did consider GCA/acute angle-closure glaucoma, CRP normal, sed rate slightly elevated at 45, however she did have an elevated sed rate of 41 5 years ago.  I believe this is nonspecific to her symptoms.  She was noted to be significantly hypertensive in the emergency room, however her lab work was overall reassuring and did not show any signs of endorgan damage.  She received a migraine cocktail, Toradol, labetalol for blood pressure.  Care of the patient received pending reevaluation after interventions.  I reevaluated the patient who overall notes significant improvement in her symptoms.  She is feeling well enough to go home.  We will start her on 10 mg of amlodipine, given strict follow-up orders to with PCP.  Patient does not have a PCP, she was instructed to call the phone number on her discharge paperwork and establish with an immediately.  We did discuss return precautions.  She voiced her standing and is agreeable.  Stable for discharge  Case discussed with Dr. Delford Field who is agreeable to the above plan and disposition  Results for orders placed or performed during the hospital encounter of 12/20/20  CBC with Differential  Result Value Ref Range   WBC 5.0 4.0 - 10.5 K/uL   RBC 3.36 (L) 3.87 - 5.11 MIL/uL   Hemoglobin 11.4 (L) 12.0 - 15.0 g/dL   HCT 51.8 (L) 84.1 - 66.0 %   MCV 99.7 80.0 - 100.0 fL   MCH 33.9 26.0 - 34.0 pg   MCHC 34.0 30.0 - 36.0  g/dL   RDW 63.0 16.0 - 10.9 %   Platelets 300 150 - 400 K/uL   nRBC 0.0 0.0 - 0.2 %   Neutrophils Relative % 61 %   Neutro Abs 3.0 1.7 - 7.7 K/uL   Lymphocytes Relative 30 %   Lymphs Abs 1.5 0.7 - 4.0 K/uL   Monocytes Relative 8 %   Monocytes Absolute 0.4 0.1 - 1.0 K/uL   Eosinophils Relative 1 %   Eosinophils Absolute 0.1 0.0 - 0.5 K/uL   Basophils Relative 0 %   Basophils Absolute 0.0 0.0 - 0.1 K/uL   Immature Granulocytes 0 %   Abs Immature Granulocytes 0.02 0.00 - 0.07 K/uL  Comprehensive metabolic panel  Result Value Ref Range   Sodium 137 135 - 145 mmol/L   Potassium 4.1 3.5 - 5.1 mmol/L   Chloride 102 98 - 111 mmol/L   CO2 28 22 - 32 mmol/L   Glucose, Bld 94 70 - 99 mg/dL   BUN 15 8 - 23 mg/dL   Creatinine, Ser 3.23 0.44 - 1.00 mg/dL   Calcium 9.2 8.9 - 55.7 mg/dL   Total Protein 7.2 6.5 - 8.1 g/dL   Albumin 3.7 3.5 - 5.0 g/dL   AST 27 15 - 41 U/L   ALT 30 0 - 44 U/L   Alkaline Phosphatase 78 38 - 126 U/L   Total Bilirubin 0.5 0.3 - 1.2 mg/dL   GFR, Estimated >  60 >60 mL/min   Anion gap 7 5 - 15  Sedimentation rate  Result Value Ref Range   Sed Rate 45 (H) 0 - 22 mm/hr  C-reactive protein  Result Value Ref Range   CRP <0.5 <1.0 mg/dL  Troponin I (High Sensitivity)  Result Value Ref Range   Troponin I (High Sensitivity) 4 <18 ng/L   CT Head Wo Contrast  Result Date: 12/20/2020 CLINICAL DATA:  Headache for several days with blurry vision. Hypertension. EXAM: CT HEAD WITHOUT CONTRAST TECHNIQUE: Contiguous axial images were obtained from the base of the skull through the vertex without intravenous contrast. COMPARISON:  None. FINDINGS: Brain: No evidence of acute infarction, hemorrhage, hydrocephalus, extra-axial collection or mass lesion/mass effect. Vascular: No hyperdense vessel or unexpected calcification. Skull: Normal. Negative for fracture or focal lesion. Sinuses/Orbits: Globes and orbits are unremarkable. Visualized sinuses are clear. Other: None. IMPRESSION:  Negative unenhanced CT scan of the brain. Electronically Signed   By: Amie Portland M.D.   On: 12/20/2020 13:54      Leone Brand 12/20/20 1630    Koleen Distance, MD 12/20/20 1650

## 2020-12-30 DIAGNOSIS — Z1152 Encounter for screening for COVID-19: Secondary | ICD-10-CM | POA: Diagnosis not present

## 2020-12-30 DIAGNOSIS — Z9189 Other specified personal risk factors, not elsewhere classified: Secondary | ICD-10-CM | POA: Diagnosis not present

## 2021-02-12 DIAGNOSIS — M170 Bilateral primary osteoarthritis of knee: Secondary | ICD-10-CM | POA: Diagnosis not present

## 2021-02-12 DIAGNOSIS — M25561 Pain in right knee: Secondary | ICD-10-CM | POA: Diagnosis not present

## 2021-02-12 DIAGNOSIS — M25562 Pain in left knee: Secondary | ICD-10-CM | POA: Diagnosis not present

## 2021-02-12 DIAGNOSIS — M1712 Unilateral primary osteoarthritis, left knee: Secondary | ICD-10-CM | POA: Diagnosis not present

## 2021-02-18 DIAGNOSIS — M1711 Unilateral primary osteoarthritis, right knee: Secondary | ICD-10-CM | POA: Diagnosis not present

## 2021-02-18 DIAGNOSIS — M25561 Pain in right knee: Secondary | ICD-10-CM | POA: Diagnosis not present

## 2021-02-19 DIAGNOSIS — M25562 Pain in left knee: Secondary | ICD-10-CM | POA: Diagnosis not present

## 2021-02-19 DIAGNOSIS — M1712 Unilateral primary osteoarthritis, left knee: Secondary | ICD-10-CM | POA: Diagnosis not present

## 2021-02-25 DIAGNOSIS — M25561 Pain in right knee: Secondary | ICD-10-CM | POA: Diagnosis not present

## 2021-02-25 DIAGNOSIS — M1711 Unilateral primary osteoarthritis, right knee: Secondary | ICD-10-CM | POA: Diagnosis not present

## 2021-02-26 DIAGNOSIS — M25562 Pain in left knee: Secondary | ICD-10-CM | POA: Diagnosis not present

## 2021-02-26 DIAGNOSIS — M1712 Unilateral primary osteoarthritis, left knee: Secondary | ICD-10-CM | POA: Diagnosis not present

## 2021-03-04 DIAGNOSIS — M25562 Pain in left knee: Secondary | ICD-10-CM | POA: Diagnosis not present

## 2021-03-04 DIAGNOSIS — M1712 Unilateral primary osteoarthritis, left knee: Secondary | ICD-10-CM | POA: Diagnosis not present

## 2021-06-01 ENCOUNTER — Ambulatory Visit: Payer: Medicare Other | Admitting: Nurse Practitioner

## 2021-06-03 ENCOUNTER — Ambulatory Visit: Payer: Medicare Other | Admitting: Nurse Practitioner

## 2021-07-06 DIAGNOSIS — Z0289 Encounter for other administrative examinations: Secondary | ICD-10-CM

## 2021-08-04 ENCOUNTER — Other Ambulatory Visit: Payer: Self-pay

## 2021-08-04 ENCOUNTER — Ambulatory Visit (INDEPENDENT_AMBULATORY_CARE_PROVIDER_SITE_OTHER): Payer: BLUE CROSS/BLUE SHIELD | Admitting: Family Medicine

## 2021-08-04 ENCOUNTER — Encounter (INDEPENDENT_AMBULATORY_CARE_PROVIDER_SITE_OTHER): Payer: Self-pay | Admitting: Family Medicine

## 2021-08-04 VITALS — BP 123/80 | HR 78 | Temp 97.6°F | Ht 60.0 in | Wt 241.0 lb

## 2021-08-04 DIAGNOSIS — I10 Essential (primary) hypertension: Secondary | ICD-10-CM

## 2021-08-04 DIAGNOSIS — R0602 Shortness of breath: Secondary | ICD-10-CM

## 2021-08-04 DIAGNOSIS — E669 Obesity, unspecified: Secondary | ICD-10-CM

## 2021-08-04 DIAGNOSIS — E7849 Other hyperlipidemia: Secondary | ICD-10-CM

## 2021-08-04 DIAGNOSIS — Z6841 Body Mass Index (BMI) 40.0 and over, adult: Secondary | ICD-10-CM

## 2021-08-04 DIAGNOSIS — R7401 Elevation of levels of liver transaminase levels: Secondary | ICD-10-CM

## 2021-08-04 DIAGNOSIS — M170 Bilateral primary osteoarthritis of knee: Secondary | ICD-10-CM

## 2021-08-04 DIAGNOSIS — Z1331 Encounter for screening for depression: Secondary | ICD-10-CM

## 2021-08-04 DIAGNOSIS — D649 Anemia, unspecified: Secondary | ICD-10-CM

## 2021-08-04 DIAGNOSIS — R739 Hyperglycemia, unspecified: Secondary | ICD-10-CM

## 2021-08-04 DIAGNOSIS — R5383 Other fatigue: Secondary | ICD-10-CM

## 2021-08-05 DIAGNOSIS — I10 Essential (primary) hypertension: Secondary | ICD-10-CM | POA: Diagnosis not present

## 2021-08-05 DIAGNOSIS — R739 Hyperglycemia, unspecified: Secondary | ICD-10-CM | POA: Diagnosis not present

## 2021-08-05 DIAGNOSIS — R5383 Other fatigue: Secondary | ICD-10-CM | POA: Diagnosis not present

## 2021-08-05 DIAGNOSIS — D649 Anemia, unspecified: Secondary | ICD-10-CM | POA: Diagnosis not present

## 2021-08-06 LAB — ANEMIA PANEL
Ferritin: 250 ng/mL — ABNORMAL HIGH (ref 15–150)
Folate, Hemolysate: 300 ng/mL
Folate, RBC: 880 ng/mL (ref >498)
Hematocrit: 34.1 % (ref 34.0–46.6)
Iron Saturation: 31 % (ref 15–55)
Iron: 87 ug/dL (ref 27–139)
Retic Ct Pct: 1.3 % (ref 0.6–2.6)
Total Iron Binding Capacity: 278 ug/dL (ref 250–450)
UIBC: 191 ug/dL (ref 118–369)
Vitamin B-12: 533 pg/mL (ref 232–1245)

## 2021-08-06 LAB — CBC WITH DIFFERENTIAL/PLATELET
Basophils Absolute: 0 10*3/uL (ref 0.0–0.2)
Basos: 0 %
EOS (ABSOLUTE): 0.1 10*3/uL (ref 0.0–0.4)
Eos: 2 %
Hemoglobin: 11.5 g/dL (ref 11.1–15.9)
Immature Grans (Abs): 0 10*3/uL (ref 0.0–0.1)
Immature Granulocytes: 0 %
Lymphocytes Absolute: 2.3 10*3/uL (ref 0.7–3.1)
Lymphs: 46 %
MCH: 32.4 pg (ref 26.6–33.0)
MCHC: 33.7 g/dL (ref 31.5–35.7)
MCV: 96 fL (ref 79–97)
Monocytes Absolute: 0.4 10*3/uL (ref 0.1–0.9)
Monocytes: 9 %
Neutrophils Absolute: 2.1 10*3/uL (ref 1.4–7.0)
Neutrophils: 43 %
Platelets: 375 10*3/uL (ref 150–450)
RBC: 3.55 x10E6/uL — ABNORMAL LOW (ref 3.77–5.28)
RDW: 11.5 % — ABNORMAL LOW (ref 11.7–15.4)
WBC: 4.9 10*3/uL (ref 3.4–10.8)

## 2021-08-06 LAB — COMPREHENSIVE METABOLIC PANEL
ALT: 16 IU/L (ref 0–32)
AST: 20 IU/L (ref 0–40)
Albumin/Globulin Ratio: 1.5 (ref 1.2–2.2)
Albumin: 4.4 g/dL (ref 3.8–4.8)
Alkaline Phosphatase: 89 IU/L (ref 44–121)
BUN/Creatinine Ratio: 11 — ABNORMAL LOW (ref 12–28)
BUN: 9 mg/dL (ref 8–27)
Bilirubin Total: 0.4 mg/dL (ref 0.0–1.2)
CO2: 24 mmol/L (ref 20–29)
Calcium: 9.4 mg/dL (ref 8.7–10.3)
Chloride: 101 mmol/L (ref 96–106)
Creatinine, Ser: 0.8 mg/dL (ref 0.57–1.00)
Globulin, Total: 2.9 g/dL (ref 1.5–4.5)
Glucose: 75 mg/dL (ref 70–99)
Potassium: 4.2 mmol/L (ref 3.5–5.2)
Sodium: 139 mmol/L (ref 134–144)
Total Protein: 7.3 g/dL (ref 6.0–8.5)
eGFR: 82 mL/min/{1.73_m2} (ref >59)

## 2021-08-06 LAB — LIPID PANEL WITH LDL/HDL RATIO
Cholesterol, Total: 201 mg/dL — ABNORMAL HIGH (ref 100–199)
HDL: 61 mg/dL (ref >39)
LDL Chol Calc (NIH): 127 mg/dL — ABNORMAL HIGH (ref 0–99)
LDL/HDL Ratio: 2.1 ratio (ref 0.0–3.2)
Triglycerides: 72 mg/dL (ref 0–149)
VLDL Cholesterol Cal: 13 mg/dL (ref 5–40)

## 2021-08-06 LAB — INSULIN, RANDOM: INSULIN: 6.2 u[IU]/mL (ref 2.6–24.9)

## 2021-08-06 LAB — VITAMIN D 25 HYDROXY (VIT D DEFICIENCY, FRACTURES): Vit D, 25-Hydroxy: 9.1 ng/mL — ABNORMAL LOW (ref 30.0–100.0)

## 2021-08-06 LAB — T4, FREE: Free T4: 1.24 ng/dL (ref 0.82–1.77)

## 2021-08-06 LAB — HEMOGLOBIN A1C
Est. average glucose Bld gHb Est-mCnc: 100 mg/dL
Hgb A1c MFr Bld: 5.1 % (ref 4.8–5.6)

## 2021-08-06 LAB — T3: T3, Total: 84 ng/dL (ref 71–180)

## 2021-08-06 NOTE — Progress Notes (Signed)
Chief Complaint:   OBESITY Michelle Rios (MR# [id number]) is a 66 y.o. female who presents for evaluation and treatment of obesity and related comorbidities. Current BMI is Body mass index is 47.07 kg/m. Michelle Rios has been struggling with her weight for many years and has been unsuccessful in either losing weight, maintaining weight loss, or reaching her healthy weight goal.  Michelle Rios is currently in the action stage of change and ready to dedicate time achieving and maintaining a healthier weight. Michelle Rios is interested in becoming our patient and working on intensive lifestyle modifications including (but not limited to) diet and exercise for weight loss.  Returning pt but was never seen. She is lactose intolerant. Pt has been doing liquid fasting since Thanksgiving- doing spinach and fruit blended together. She is eating out everyday- [employer], [employer], [employer], or [employer]. Breakfast- smoothie from [employer]; Supper- pineapple smoothie with spinach +/- banana. Pt never cooks at home. She will eat chicken or fish sparingly.  Michelle Rios's habits were reviewed today and are as follows: her desired weight loss is 106 lbs, she started gaining weight after childbirth, her heaviest weight ever was 241 pounds, she has significant food cravings issues, she snacks frequently in the evenings, she wakes up frequently in the middle of the night to eat, she skips meals frequently, she is frequently drinking liquids with calories, she has problems with excessive hunger, she frequently eats larger portions than normal, and she has binge eating behaviors.  Depression Screen Michelle Rios's Food and Mood (modified PHQ-9) score was 14.  Depression screen PHQ 2/9 08/04/2021  Decreased Interest 1  Down, Depressed, Hopeless 3  PHQ - 2 Score 4  Altered sleeping 3  Tired, decreased energy 0  Change in appetite 1  Feeling bad or failure about yourself  3  Trouble concentrating 0  Moving slowly or  fidgety/restless 3  Suicidal thoughts 0  PHQ-9 Score 14  Difficult doing work/chores Not difficult at all   Subjective:   1. Other fatigue Michelle Rios admits to daytime somnolence and admits to waking up still tired. Patent has a history of symptoms of daytime fatigue, morning fatigue, and Epworth sleepiness scale. Michelle Rios generally gets 6 or 7 hours of sleep per night, and states that she has generally restful sleep. Snoring is not present. Apneic episodes are not present. Epworth Sleepiness Score is 17. EKG- low voltage, normal sinus rhythm at 76 bpm.  2. SOBOE (shortness of breath on exertion) Michelle Rios notes increasing shortness of breath with exercising and seems to be worsening over time with weight gain. She notes getting out of breath sooner with activity than she used to. This has gotten worse recently. Michelle Rios denies shortness of breath at rest or orthopnea. EKG- low voltage, normal sinus rhythm at 76 bpm.  3. Hyperglycemia BS elevated in the past.  4. Osteoarthritis of both knees, unspecified osteoarthritis type Pt sees ortho and has gotten injections in the past.  5. Transaminitis Elevated LFT's in the past. Hep C negative.  6. Essential hypertension BP controlled today. Pt was previously on HCTZ but is not on meds now.  7. Anemia, unspecified type Pt's MCV has been on the larger side of normal but H/H are low. Her iron and ferritin previously WNL.  8. Other hyperlipidemia H/o elevated LDL. Pt is not on statin therapy.  Assessment/Plan:   1. Other fatigue Michelle Rios does feel that her weight is causing her energy to be lower than it should be. Fatigue may be related to  obesity, depression or many other causes. Labs will be ordered, and in the meanwhile, Michelle Rios will focus on self care including making healthy food choices, increasing physical activity and focusing on stress reduction. Check labs today.  - EKG 12-Lead - T3 - T4, free - VITAMIN D 25 Hydroxy (Vit-D  Deficiency, Fractures)  2. SOBOE (shortness of breath on exertion) [name] does feel that she gets out of breath more easily that she used to when she exercises. [name]'s shortness of breath appears to be obesity related and exercise induced. She has agreed to work on weight loss and gradually increase exercise to treat her exercise induced shortness of breath. Will continue to monitor closely.  3. Hyperglycemia Fasting labs will be obtained and results with be discussed with Michelle Che in 2 weeks at her follow up visit. In the meanwhile [name] was started on a lower simple carbohydrate diet and will work on weight loss efforts.  - Hemoglobin A1c - Insulin, random  4. Osteoarthritis of both knees, unspecified osteoarthritis type F/u with pt on symptoms in subsequent appts.  5. Transaminitis Check labs today.  6. Essential hypertension Michelle Rios is working on healthy weight loss and exercise to improve blood pressure control. We will watch for signs of hypotension as she continues her lifestyle modifications. Check labs today.  - Comprehensive metabolic panel  7. Anemia, unspecified type Check labs today.  - CBC with Differential/Platelet - Anemia panel  8. Other hyperlipidemia Cardiovascular risk and specific lipid/LDL goals reviewed.  We discussed several lifestyle modifications today and Michelle Rios will continue to work on diet, exercise and weight loss efforts. Orders and follow up as documented in patient record.   Counseling Intensive lifestyle modifications are the first line treatment for this issue. Dietary changes: Increase soluble fiber. Decrease simple carbohydrates. Exercise changes: Moderate to vigorous-intensity aerobic activity 150 minutes per week if tolerated. Lipid-lowering medications: see documented in medical record. Check labs today. - Lipid Panel With LDL/HDL Ratio  9. Depression screening Michelle Rios had a positive depression screening. Depression is  commonly associated with obesity and often results in emotional eating behaviors. We will monitor this closely and work on CBT to help improve the non-hunger eating patterns. Referral to Psychology may be required if no improvement is seen as she continues in our clinic.  10. Obesity with current BMI of 47.2  Michelle Rios is currently in the action stage of change and her goal is to continue with weight loss efforts. I recommend Michelle Rios begin the structured treatment plan as follows:  She has agreed to keeping a food journal and adhering to recommended goals of 1050-1200 calories and 75+ grams protein.  Exercise goals: No exercise has been prescribed at this time.   Behavioral modification strategies: increasing lean protein intake, meal planning and cooking strategies, planning for success, and keeping a strict food journal.  She was informed of the importance of frequent follow-up visits to maximize her success with intensive lifestyle modifications for her multiple health conditions. She was informed we would discuss her lab results at her next visit unless there is a critical issue that needs to be addressed sooner. Michelle Rios agreed to keep her next visit at the agreed upon time to discuss these results.  Objective:   Blood pressure 123/80, pulse 78, temperature 97.6 F (36.4 C), height 5' (1.524 m), weight 241 lb (109.3 kg), SpO2 98 %. Body mass index is 47.07 kg/m.  EKG: Normal sinus rhythm, rate 76.  Indirect Calorimeter completed today shows a VO2 of 214 and  a REE of 1469.  Her calculated basal metabolic rate is 1610 thus her basal metabolic rate is worse than expected.  General: Cooperative, alert, well developed, in no acute distress. HEENT: Conjunctivae and lids unremarkable. Cardiovascular: Regular rhythm.  Lungs: Normal work of breathing. Neurologic: No focal deficits.   Lab Results  Component Value Date   CREATININE 0.80 08/05/2021   BUN 9 08/05/2021   NA 139 08/05/2021    K 4.2 08/05/2021   CL 101 08/05/2021   CO2 24 08/05/2021   Lab Results  Component Value Date   ALT 16 08/05/2021   AST 20 08/05/2021   ALKPHOS 89 08/05/2021   BILITOT 0.4 08/05/2021   Lab Results  Component Value Date   HGBA1C 5.1 08/05/2021   HGBA1C 5.2 02/09/2017   Lab Results  Component Value Date   INSULIN 6.2 08/05/2021   Lab Results  Component Value Date   TSH 2.330 02/02/2018   Lab Results  Component Value Date   CHOL 201 (H) 08/05/2021   HDL 61 08/05/2021   LDLCALC 127 (H) 08/05/2021   TRIG 72 08/05/2021   CHOLHDL 2.9 12/01/2017   Lab Results  Component Value Date   WBC 4.9 08/05/2021   HGB 11.5 08/05/2021   HCT 34.1 08/05/2021   MCV 96 08/05/2021   PLT 375 08/05/2021   Lab Results  Component Value Date   IRON 87 08/05/2021   TIBC 278 08/05/2021   FERRITIN 250 (H) 08/05/2021    Attestation Statements:   Reviewed by clinician on day of visit: allergies, medications, problem list, medical history, surgical history, family history, social history, and previous encounter notes.  Edmund Hilda, CMA, am acting as transcriptionist for Reuben Likes, MD.  This is the patient's first visit at [facility]. The patient's NEW PATIENT PACKET was reviewed at length. Included in the packet: current and past health history, medications, allergies, ROS, gynecologic history (women only), surgical history, family history, social history, weight history, weight loss surgery history (for those that have had weight loss surgery), nutritional evaluation, mood and food questionnaire, PHQ9, Epworth questionnaire, sleep habits questionnaire, patient life and health improvement goals questionnaire. These will all be scanned into the patient's chart under media.   During the visit, I independently reviewed the patient's EKG, bioimpedance scale results, and indirect calorimeter results. I used this information to tailor a meal plan for the patient that will  help her to lose weight and will improve her obesity-related conditions going forward. I performed a medically necessary appropriate examination and/or evaluation. I discussed the assessment and treatment plan with the patient. The patient was provided an opportunity to ask questions and all were answered. The patient agreed with the plan and demonstrated an understanding of the instructions. Labs were ordered at this visit and will be reviewed at the next visit unless more critical results need to be addressed immediately. Clinical information was updated and documented in the EMR.   Time spent on visit including pre-visit chart review and post-visit care was 60 minutes.   A separate 15 minutes was spent on risk counseling (see above).   I have reviewed the above documentation for accuracy and completeness, and I agree with the above. - Reuben Likes, MD

## 2021-08-18 ENCOUNTER — Ambulatory Visit (INDEPENDENT_AMBULATORY_CARE_PROVIDER_SITE_OTHER): Payer: Medicare Other | Admitting: Family Medicine

## 2021-08-18 ENCOUNTER — Other Ambulatory Visit: Payer: Self-pay

## 2021-08-18 ENCOUNTER — Encounter (INDEPENDENT_AMBULATORY_CARE_PROVIDER_SITE_OTHER): Payer: Self-pay | Admitting: Family Medicine

## 2021-08-18 VITALS — BP 148/85 | HR 73 | Temp 98.4°F | Ht 60.0 in | Wt 242.0 lb

## 2021-08-18 DIAGNOSIS — F439 Reaction to severe stress, unspecified: Secondary | ICD-10-CM | POA: Diagnosis not present

## 2021-08-18 DIAGNOSIS — I10 Essential (primary) hypertension: Secondary | ICD-10-CM | POA: Diagnosis not present

## 2021-08-18 DIAGNOSIS — E559 Vitamin D deficiency, unspecified: Secondary | ICD-10-CM

## 2021-08-18 DIAGNOSIS — Z6841 Body Mass Index (BMI) 40.0 and over, adult: Secondary | ICD-10-CM

## 2021-08-18 DIAGNOSIS — E669 Obesity, unspecified: Secondary | ICD-10-CM

## 2021-08-18 DIAGNOSIS — E7849 Other hyperlipidemia: Secondary | ICD-10-CM | POA: Diagnosis not present

## 2021-08-18 DIAGNOSIS — Z9189 Other specified personal risk factors, not elsewhere classified: Secondary | ICD-10-CM

## 2021-08-18 MED ORDER — VITAMIN D (ERGOCALCIFEROL) 1.25 MG (50000 UNIT) PO CAPS: 50000.0000 [IU] | ORAL_CAPSULE | ORAL | 0 refills | Status: DC

## 2021-08-18 NOTE — Progress Notes (Signed)
Chief Complaint:   OBESITY Michelle Rios is here to discuss her progress with her obesity treatment plan along with follow-up of her obesity related diagnoses. Michelle Rios is on keeping a food journal and adhering to recommended goals of 1050-1200 calories and 75+ grams protein and states she is following her eating plan approximately 50% of the time. Michelle Rios states she is not currently exercising.  Today's visit was #: 2 Starting weight: 241 lbs Starting date: 08/04/2021 Today's weight: 242 lbs Today's date: 08/18/2021 Total lbs lost to date: 0 Total lbs lost since last in-office visit: 0  Interim History: Pt wrote all food and liquids down over the last few weeks. She didn't know calories as she didn't use [employer]itness[employer], due to not wanting her ex to have access to what she is doing. Breakfast varies but mostly smoothies. Pt is not eating full meals for lunch or dinner- is eating more macaroni and cheese. She brings 2 juices/smoothies today that she drank this morning- total calories combined 480 calories with 3 grams protein.  Subjective:   1. Other hyperlipidemia Pt has a total cholesterol of 201, LDL of 127, HDL 61, and triglycerides 72.  2. Vitamin D deficiency Pt is not on Vit D supplementation. Her Vit D level is 9.1 and she reports fatigue.  3. Essential hypertension BP slightly elevated. Pt denies chest pain/chest pressure/headache. She is not on meds. Pt reports being upset.  4. Situational stress Pt is dealing with a narcissist.  5. At risk for deficient intake of food Michelle Rios is at risk for deficient intake of food due to inadequate intake.  Assessment/Plan:   1. Other hyperlipidemia Cardiovascular risk and specific lipid/LDL goals reviewed.  We discussed several lifestyle modifications today and Michelle Rios will continue to work on diet, exercise and weight loss efforts. Orders and follow up as documented in patient record. Pt is to continue working on journaling and being  mindful of ca/protein. Repeat labs in 3 months.  Counseling Intensive lifestyle modifications are the first line treatment for this issue. Dietary changes: Increase soluble fiber. Decrease simple carbohydrates. Exercise changes: Moderate to vigorous-intensity aerobic activity 150 minutes per week if tolerated. Lipid-lowering medications: see documented in medical record.  2. Vitamin D deficiency Low Vitamin D level contributes to fatigue and are associated with obesity, breast, and colon cancer. She agrees to continue to take prescription Vitamin D 50,000 IU every week and will follow-up for routine testing of Vitamin D, at least 2-3 times per year to avoid over-replacement.  Refill- Vitamin D, Ergocalciferol, (DRISDOL) 1.25 MG (50000 UNIT) CAPS capsule; Take 1 capsule (50,000 Units total) by mouth every 7 (seven) days.  Dispense: 4 capsule; Refill: 0  3. Essential hypertension Michelle Rios is working on healthy weight loss and exercise to improve blood pressure control. We will watch for signs of hypotension as she continues her lifestyle modifications. F/u BP at next appt. If BP is still elevated, we may need to discuss medication options.  4. Situational stress F/u on mental health at next appt. Pt given YouTube information for Dr. Maretta Los to help deal with narcissist.  5. At risk for deficient intake of food Michelle Rios was given approximately 30 minutes of deficit intake of food prevention counseling today. Michelle Rios is at risk for eating too few calories based on current food recall. She was encouraged to focus on meeting caloric and protein goals according to her recommended meal plan.    6. Obesity with current BMI of 47.4  Michelle Rios is currently in  the action stage of change. As such, her goal is to continue with weight loss efforts. She has agreed to keeping a food journal and adhering to recommended goals of 1050-1200 calories and 75+ grams protein.   Exercise goals: No exercise has been  prescribed at this time.  Behavioral modification strategies: increasing lean protein intake, no skipping meals, meal planning and cooking strategies, keeping healthy foods in the home, and keeping a strict food journal.  Michelle Rios has agreed to follow-up with our clinic in 2 weeks. She was informed of the importance of frequent follow-up visits to maximize her success with intensive lifestyle modifications for her multiple health conditions.   Objective:   Blood pressure (!) 148/85, pulse 73, temperature 98.4 F (36.9 C), height 5' (1.524 m), weight 242 lb (109.8 kg), SpO2 99 %. Body mass index is 47.26 kg/m.  General: Cooperative, alert, well developed, in no acute distress. HEENT: Conjunctivae and lids unremarkable. Cardiovascular: Regular rhythm.  Lungs: Normal work of breathing. Neurologic: No focal deficits.   Lab Results  Component Value Date   CREATININE 0.80 08/05/2021   BUN 9 08/05/2021   NA 139 08/05/2021   K 4.2 08/05/2021   CL 101 08/05/2021   CO2 24 08/05/2021   Lab Results  Component Value Date   ALT 16 08/05/2021   AST 20 08/05/2021   ALKPHOS 89 08/05/2021   BILITOT 0.4 08/05/2021   Lab Results  Component Value Date   HGBA1C 5.1 08/05/2021   HGBA1C 5.2 02/09/2017   Lab Results  Component Value Date   INSULIN 6.2 08/05/2021   Lab Results  Component Value Date   TSH 2.330 02/02/2018   Lab Results  Component Value Date   CHOL 201 (H) 08/05/2021   HDL 61 08/05/2021   LDLCALC 127 (H) 08/05/2021   TRIG 72 08/05/2021   CHOLHDL 2.9 12/01/2017   Lab Results  Component Value Date   VD25OH 9.1 (L) 08/05/2021   Lab Results  Component Value Date   WBC 4.9 08/05/2021   HGB 11.5 08/05/2021   HCT 34.1 08/05/2021   MCV 96 08/05/2021   PLT 375 08/05/2021   Lab Results  Component Value Date   IRON 87 08/05/2021   TIBC 278 08/05/2021   FERRITIN 250 (H) 08/05/2021    Attestation Statements:   Reviewed by clinician on day of visit: allergies,  medications, problem list, medical history, surgical history, family history, social history, and previous encounter notes.  Edmund Hilda, CMA, am acting as transcriptionist for Reuben Likes, MD.  I have reviewed the above documentation for accuracy and completeness, and I agree with the above. - Reuben Likes, MD

## 2021-09-01 ENCOUNTER — Ambulatory Visit (INDEPENDENT_AMBULATORY_CARE_PROVIDER_SITE_OTHER): Payer: Medicare Other | Admitting: Family Medicine

## 2022-04-10 ENCOUNTER — Emergency Department: Payer: Medicare HMO

## 2022-04-10 ENCOUNTER — Encounter: Payer: Self-pay | Admitting: Radiology

## 2022-04-10 ENCOUNTER — Inpatient Hospital Stay
Admission: EM | Admit: 2022-04-10 | Discharge: 2022-04-13 | DRG: 193 | Disposition: A | Discharge status: Home / Self Care | Payer: Medicare HMO | Attending: Internal Medicine | Admitting: Internal Medicine

## 2022-04-10 DIAGNOSIS — E66813 Obesity, class 3: Secondary | ICD-10-CM

## 2022-04-10 DIAGNOSIS — E041 Nontoxic single thyroid nodule: Secondary | ICD-10-CM

## 2022-04-10 DIAGNOSIS — Z8249 Family history of ischemic heart disease and other diseases of the circulatory system: Secondary | ICD-10-CM

## 2022-04-10 DIAGNOSIS — Z23 Encounter for immunization: Secondary | ICD-10-CM | POA: Diagnosis not present

## 2022-04-10 DIAGNOSIS — K760 Fatty (change of) liver, not elsewhere classified: Secondary | ICD-10-CM | POA: Diagnosis present

## 2022-04-10 DIAGNOSIS — R1011 Right upper quadrant pain: Secondary | ICD-10-CM | POA: Diagnosis not present

## 2022-04-10 DIAGNOSIS — R079 Chest pain, unspecified: Secondary | ICD-10-CM

## 2022-04-10 DIAGNOSIS — M7989 Other specified soft tissue disorders: Secondary | ICD-10-CM | POA: Diagnosis not present

## 2022-04-10 DIAGNOSIS — Z823 Family history of stroke: Secondary | ICD-10-CM

## 2022-04-10 DIAGNOSIS — Z833 Family history of diabetes mellitus: Secondary | ICD-10-CM

## 2022-04-10 DIAGNOSIS — J168 Pneumonia due to other specified infectious organisms: Secondary | ICD-10-CM | POA: Diagnosis not present

## 2022-04-10 DIAGNOSIS — I10 Essential (primary) hypertension: Secondary | ICD-10-CM | POA: Diagnosis not present

## 2022-04-10 DIAGNOSIS — R0789 Other chest pain: Secondary | ICD-10-CM | POA: Diagnosis not present

## 2022-04-10 DIAGNOSIS — Z6841 Body Mass Index (BMI) 40.0 and over, adult: Secondary | ICD-10-CM

## 2022-04-10 DIAGNOSIS — R918 Other nonspecific abnormal finding of lung field: Secondary | ICD-10-CM | POA: Diagnosis not present

## 2022-04-10 DIAGNOSIS — Z79899 Other long term (current) drug therapy: Secondary | ICD-10-CM

## 2022-04-10 DIAGNOSIS — D638 Anemia in other chronic diseases classified elsewhere: Secondary | ICD-10-CM

## 2022-04-10 DIAGNOSIS — Z86711 Personal history of pulmonary embolism: Secondary | ICD-10-CM

## 2022-04-10 DIAGNOSIS — Z91041 Radiographic dye allergy status: Secondary | ICD-10-CM

## 2022-04-10 DIAGNOSIS — Z888 Allergy status to other drugs, medicaments and biological substances status: Secondary | ICD-10-CM

## 2022-04-10 DIAGNOSIS — Z811 Family history of alcohol abuse and dependence: Secondary | ICD-10-CM

## 2022-04-10 DIAGNOSIS — Z803 Family history of malignant neoplasm of breast: Secondary | ICD-10-CM

## 2022-04-10 DIAGNOSIS — J9601 Acute respiratory failure with hypoxia: Secondary | ICD-10-CM | POA: Diagnosis not present

## 2022-04-10 DIAGNOSIS — J189 Pneumonia, unspecified organism: Secondary | ICD-10-CM | POA: Diagnosis not present

## 2022-04-10 DIAGNOSIS — Z90710 Acquired absence of both cervix and uterus: Secondary | ICD-10-CM

## 2022-04-10 DIAGNOSIS — Z20822 Contact with and (suspected) exposure to covid-19: Secondary | ICD-10-CM | POA: Diagnosis present

## 2022-04-10 DIAGNOSIS — N179 Acute kidney failure, unspecified: Secondary | ICD-10-CM

## 2022-04-10 DIAGNOSIS — Z83438 Family history of other disorder of lipoprotein metabolism and other lipidemia: Secondary | ICD-10-CM

## 2022-04-10 DIAGNOSIS — M549 Dorsalgia, unspecified: Secondary | ICD-10-CM | POA: Diagnosis not present

## 2022-04-10 DIAGNOSIS — D649 Anemia, unspecified: Secondary | ICD-10-CM | POA: Diagnosis present

## 2022-04-10 DIAGNOSIS — K5730 Diverticulosis of large intestine without perforation or abscess without bleeding: Secondary | ICD-10-CM | POA: Diagnosis not present

## 2022-04-10 DIAGNOSIS — Z841 Family history of disorders of kidney and ureter: Secondary | ICD-10-CM

## 2022-04-10 DIAGNOSIS — Z8051 Family history of malignant neoplasm of kidney: Secondary | ICD-10-CM

## 2022-04-10 DIAGNOSIS — E559 Vitamin D deficiency, unspecified: Secondary | ICD-10-CM

## 2022-04-10 DIAGNOSIS — M069 Rheumatoid arthritis, unspecified: Secondary | ICD-10-CM | POA: Diagnosis present

## 2022-04-10 DIAGNOSIS — E739 Lactose intolerance, unspecified: Secondary | ICD-10-CM | POA: Diagnosis present

## 2022-04-10 DIAGNOSIS — D860 Sarcoidosis of lung: Secondary | ICD-10-CM

## 2022-04-10 DIAGNOSIS — J439 Emphysema, unspecified: Secondary | ICD-10-CM | POA: Diagnosis not present

## 2022-04-10 DIAGNOSIS — E785 Hyperlipidemia, unspecified: Secondary | ICD-10-CM | POA: Diagnosis present

## 2022-04-10 LAB — CBC WITH DIFFERENTIAL/PLATELET
Abs Immature Granulocytes: 0.03 10*3/uL (ref 0.00–0.07)
Basophils Absolute: 0 10*3/uL (ref 0.0–0.1)
Basophils Relative: 1 %
Eosinophils Absolute: 0.1 10*3/uL (ref 0.0–0.5)
Eosinophils Relative: 2 %
HCT: 35.1 % — ABNORMAL LOW (ref 36.0–46.0)
Hemoglobin: 11.4 g/dL — ABNORMAL LOW (ref 12.0–15.0)
Immature Granulocytes: 1 %
Lymphocytes Relative: 26 %
Lymphs Abs: 1.5 10*3/uL (ref 0.7–4.0)
MCH: 32.2 pg (ref 26.0–34.0)
MCHC: 32.5 g/dL (ref 30.0–36.0)
MCV: 99.2 fL (ref 80.0–100.0)
Monocytes Absolute: 0.5 10*3/uL (ref 0.1–1.0)
Monocytes Relative: 8 %
Neutro Abs: 3.7 10*3/uL (ref 1.7–7.7)
Neutrophils Relative %: 62 %
Platelets: 344 10*3/uL (ref 150–400)
RBC: 3.54 MIL/uL — ABNORMAL LOW (ref 3.87–5.11)
RDW: 11.5 % (ref 11.5–15.5)
WBC: 5.9 10*3/uL (ref 4.0–10.5)
nRBC: 0 % (ref 0.0–0.2)

## 2022-04-10 LAB — COMPREHENSIVE METABOLIC PANEL
ALT: 13 U/L (ref 0–44)
AST: 20 U/L (ref 15–41)
Albumin: 4 g/dL (ref 3.5–5.0)
Alkaline Phosphatase: 81 U/L (ref 38–126)
Anion gap: 11 (ref 5–15)
BUN: 14 mg/dL (ref 8–23)
CO2: 27 mmol/L (ref 22–32)
Calcium: 9.3 mg/dL (ref 8.9–10.3)
Chloride: 103 mmol/L (ref 98–111)
Creatinine, Ser: 0.92 mg/dL (ref 0.44–1.00)
GFR, Estimated: >60 mL/min (ref >60)
Glucose, Bld: 91 mg/dL (ref 70–99)
Potassium: 3.8 mmol/L (ref 3.5–5.1)
Sodium: 141 mmol/L (ref 135–145)
Total Bilirubin: 0.7 mg/dL (ref 0.3–1.2)
Total Protein: 8 g/dL (ref 6.5–8.1)

## 2022-04-10 LAB — TROPONIN I (HIGH SENSITIVITY)
Troponin I (High Sensitivity): 4 ng/L (ref <18)
Troponin I (High Sensitivity): 3 ng/L (ref <18)

## 2022-04-10 LAB — BRAIN NATRIURETIC PEPTIDE: B Natriuretic Peptide: 30.8 pg/mL (ref 0.0–100.0)

## 2022-04-10 LAB — LIPASE, BLOOD: Lipase: 26 U/L (ref 11–51)

## 2022-04-10 LAB — SARS CORONAVIRUS 2 BY RT PCR: SARS Coronavirus 2 by RT PCR: NEGATIVE

## 2022-04-10 MED ORDER — GADOBUTROL 1 MMOL/ML IV SOLN
10.0000 mL | Freq: Once | INTRAVENOUS | Status: AC | PRN
  Administered 2022-04-10: 10 mL via INTRAVENOUS

## 2022-04-10 MED ORDER — HYDROMORPHONE HCL 1 MG/ML IJ SOLN
0.5000 mg | Freq: Once | INTRAMUSCULAR | Status: AC
  Administered 2022-04-10: 0.5 mg via INTRAVENOUS
  Filled 2022-04-10: qty 0.5

## 2022-04-10 MED ORDER — MORPHINE SULFATE (PF) 4 MG/ML IV SOLN
4.0000 mg | Freq: Once | INTRAVENOUS | Status: AC
  Administered 2022-04-10: 4 mg via INTRAVENOUS
  Filled 2022-04-10: qty 1

## 2022-04-10 MED ORDER — ONDANSETRON HCL 4 MG/2ML IJ SOLN
4.0000 mg | Freq: Once | INTRAMUSCULAR | Status: AC
  Administered 2022-04-10: 4 mg via INTRAVENOUS
  Filled 2022-04-10: qty 2

## 2022-04-10 MED ORDER — LIDOCAINE 5 % EX PTCH
1.0000 | MEDICATED_PATCH | CUTANEOUS | Status: DC
  Administered 2022-04-10 – 2022-04-12 (×3): 1 via TRANSDERMAL
  Filled 2022-04-10 (×3): qty 1

## 2022-04-10 NOTE — Discharge Instructions (Addendum)
If the lidoderm patch helped your pain can use salonpas 4% over the counter

## 2022-04-10 NOTE — ED Provider Notes (Signed)
[facility] Provider Note    Event Date/Time   First MD Initiated Contact with Patient 04/10/22 1916     (approximate)   History   Chest Pain   HPI  Michelle Rios is a 66 y.o. female with hypertension, hyperlipidemia who comes in with concerns for chest pain.  Patient reports having sudden onset of chest pain around 5 PM.  She reports that it just came on all of a sudden.  She reports the pain is on the right side and is there constantly.  She denies any new swelling in her legs but does have some baseline peripheral edema when she ambulates she reports.  She denies any abdominal pain.  She reports the pain is there no matter what is not worse with breathing or with certain movements it is constant.   Physical Exam   Triage Vital Signs: There were no vitals taken for this visit.   Most recent vital signs: Vitals:   04/10/22 2110 04/10/22 2112  BP:    Pulse: (!) 58 (!) 50  Resp: 10 12  SpO2: (!) 85% 99%     General: Awake, no distress.  CV:  Good peripheral perfusion.  Resp:  Normal effort.  Abd:  No distention. Soft and non tender  Other:  R chest pain per patient. Good pulses.    ED Results / Procedures / Treatments   Labs (all labs ordered are listed, but only abnormal results are displayed) Labs Reviewed  CBC WITH DIFFERENTIAL/PLATELET - Abnormal; Notable for the following components:      Result Value   RBC 3.54 (*)    Hemoglobin 11.4 (*)    HCT 35.1 (*)    All other components within normal limits  SARS CORONAVIRUS 2 BY RT PCR  COMPREHENSIVE METABOLIC PANEL  LIPASE, BLOOD  BRAIN NATRIURETIC PEPTIDE  TROPONIN I (HIGH SENSITIVITY)  TROPONIN I (HIGH SENSITIVITY)     EKG  My interpretation of EKG:  Normal sinus rate of 73 without any ST elevation, T wave version in lead III, normal intervals  RADIOLOGY I have reviewed the xray personally and interpretted and no PTX  PROCEDURES:  Critical Care performed: No  .1-3  Lead EKG Interpretation  Performed by: Vanessa Hancock, MD Authorized by: Vanessa Artesia, MD     Interpretation: abnormal     ECG rate:  50   ECG rate assessment: bradycardic     Rhythm: sinus bradycardia     Ectopy: none     Conduction: normal      MEDICATIONS ORDERED IN ED: Medications  lidocaine (LIDODERM) 5 % 1 patch (1 patch Transdermal Patch Applied 04/10/22 1948)  morphine (PF) 4 MG/ML injection 4 mg (4 mg Intravenous Given 04/10/22 1948)  ondansetron (ZOFRAN) injection 4 mg (4 mg Intravenous Given 04/10/22 1948)  HYDROmorphone (DILAUDID) injection 0.5 mg (0.5 mg Intravenous Given 04/10/22 2051)     IMPRESSION / MDM / ASSESSMENT AND PLAN / ED COURSE  I reviewed the triage vital signs and the nursing notes.   Patient's presentation is most consistent with acute presentation with potential threat to life or bodily function.   Pt comes in with severe Right sided chest pain and HTN-- xray no widen mediastinum.  Patient given some morphine initially she reported that it controlled the pain.  IV started on blood work to look for ACS.  BNP negative CBC shows stable.  CMP reassuring.  Lipase normal troponin negative COVID-negative   8:51 PM Patient reports  that she initially had relief of the pain slightly but then it started to come back and it comes in waves.  She is adamant that it is more of her right upper area not her abdomen but given her contrast allergy I am unable to do a CT dissection given she states that the pain radiates into her back we will try to get a MRI angio.  We will get CTs without contrast as well as ultrasound of the gallbladder and ultrasounds DVTs given she does have some leg swelling to make sure there is nothing that could be a PE although she really reports this more of the pain than the shortness of breath.  CT imaging is overall reassuring however did provide a copy of report there are some possible right upper lobe opacifications so we will treat as as this  could be pneumonia however follow-up outpatient for recheck.  DVT ultrasounds are negative.  Hepatic steatosis but no other evidence of issues.  I did discuss with the family the possible pneumonia on CT imaging and some lymphadenopathy and need to follow-up for repeat CT imaging.  Patient reporting worsening her pain and given some Dilaudid.  Briefly hypoxic but I suspect from the Dilaudid.  Patient be handed off to oncoming team pending MRI thoracic, ambulation trial off oxygen and possible discharge with antibiotics versus admission based upon oxygen  The patient is on the cardiac monitor to evaluate for evidence of arrhythmia and/or significant heart rate changes.      FINAL CLINICAL IMPRESSION(S) / ED DIAGNOSES   Final diagnoses:  Chest pain, unspecified type     Rx / DC Orders   ED Discharge Orders     None        Note:  This document was prepared using Dragon voice recognition software and may include unintentional dictation errors.   Concha Se, MD 04/10/22 [phone]

## 2022-04-10 NOTE — ED Triage Notes (Signed)
Pt reports CP starting at 1700 while getting dressed for church.  Pain is 10/10 R lower chest radiating to back.  Pt reports Hx of similar pain r/t Sarcoidosis. Pt denies SOB, dizziness.

## 2022-04-11 ENCOUNTER — Other Ambulatory Visit: Payer: Self-pay

## 2022-04-11 DIAGNOSIS — R079 Chest pain, unspecified: Secondary | ICD-10-CM | POA: Diagnosis not present

## 2022-04-11 DIAGNOSIS — Z86711 Personal history of pulmonary embolism: Secondary | ICD-10-CM | POA: Diagnosis not present

## 2022-04-11 DIAGNOSIS — N179 Acute kidney failure, unspecified: Secondary | ICD-10-CM

## 2022-04-11 DIAGNOSIS — D649 Anemia, unspecified: Secondary | ICD-10-CM

## 2022-04-11 DIAGNOSIS — R0781 Pleurodynia: Secondary | ICD-10-CM | POA: Diagnosis not present

## 2022-04-11 DIAGNOSIS — D638 Anemia in other chronic diseases classified elsewhere: Secondary | ICD-10-CM | POA: Diagnosis not present

## 2022-04-11 DIAGNOSIS — E559 Vitamin D deficiency, unspecified: Secondary | ICD-10-CM

## 2022-04-11 DIAGNOSIS — D860 Sarcoidosis of lung: Secondary | ICD-10-CM

## 2022-04-11 DIAGNOSIS — J9601 Acute respiratory failure with hypoxia: Secondary | ICD-10-CM | POA: Diagnosis not present

## 2022-04-11 DIAGNOSIS — Z91041 Radiographic dye allergy status: Secondary | ICD-10-CM | POA: Diagnosis not present

## 2022-04-11 DIAGNOSIS — Z83438 Family history of other disorder of lipoprotein metabolism and other lipidemia: Secondary | ICD-10-CM | POA: Diagnosis not present

## 2022-04-11 DIAGNOSIS — Z833 Family history of diabetes mellitus: Secondary | ICD-10-CM | POA: Diagnosis not present

## 2022-04-11 DIAGNOSIS — E041 Nontoxic single thyroid nodule: Secondary | ICD-10-CM | POA: Diagnosis not present

## 2022-04-11 DIAGNOSIS — I10 Essential (primary) hypertension: Secondary | ICD-10-CM | POA: Diagnosis not present

## 2022-04-11 DIAGNOSIS — M069 Rheumatoid arthritis, unspecified: Secondary | ICD-10-CM | POA: Diagnosis not present

## 2022-04-11 DIAGNOSIS — Z23 Encounter for immunization: Secondary | ICD-10-CM | POA: Diagnosis not present

## 2022-04-11 DIAGNOSIS — J189 Pneumonia, unspecified organism: Principal | ICD-10-CM

## 2022-04-11 DIAGNOSIS — M19011 Primary osteoarthritis, right shoulder: Secondary | ICD-10-CM | POA: Diagnosis not present

## 2022-04-11 DIAGNOSIS — Z90710 Acquired absence of both cervix and uterus: Secondary | ICD-10-CM | POA: Diagnosis not present

## 2022-04-11 DIAGNOSIS — Z8249 Family history of ischemic heart disease and other diseases of the circulatory system: Secondary | ICD-10-CM | POA: Diagnosis not present

## 2022-04-11 DIAGNOSIS — K760 Fatty (change of) liver, not elsewhere classified: Secondary | ICD-10-CM | POA: Diagnosis not present

## 2022-04-11 DIAGNOSIS — R0789 Other chest pain: Secondary | ICD-10-CM | POA: Diagnosis not present

## 2022-04-11 DIAGNOSIS — Z888 Allergy status to other drugs, medicaments and biological substances status: Secondary | ICD-10-CM | POA: Diagnosis not present

## 2022-04-11 DIAGNOSIS — Z6841 Body Mass Index (BMI) 40.0 and over, adult: Secondary | ICD-10-CM | POA: Diagnosis not present

## 2022-04-11 DIAGNOSIS — Z841 Family history of disorders of kidney and ureter: Secondary | ICD-10-CM | POA: Diagnosis not present

## 2022-04-11 DIAGNOSIS — Z20822 Contact with and (suspected) exposure to covid-19: Secondary | ICD-10-CM | POA: Diagnosis not present

## 2022-04-11 DIAGNOSIS — E785 Hyperlipidemia, unspecified: Secondary | ICD-10-CM | POA: Diagnosis not present

## 2022-04-11 HISTORY — DX: Pneumonia, unspecified organism: J18.9

## 2022-04-11 LAB — RESPIRATORY PANEL BY PCR

## 2022-04-11 LAB — CBC
HCT: 36 % (ref 36.0–46.0)
Hemoglobin: 11.6 g/dL — ABNORMAL LOW (ref 12.0–15.0)
MCH: 32.9 pg (ref 26.0–34.0)
MCHC: 32.2 g/dL (ref 30.0–36.0)
MCV: 102 fL — ABNORMAL HIGH (ref 80.0–100.0)
Platelets: 325 10*3/uL (ref 150–400)
RBC: 3.53 MIL/uL — ABNORMAL LOW (ref 3.87–5.11)
RDW: 11.9 % (ref 11.5–15.5)
WBC: 5.5 10*3/uL (ref 4.0–10.5)
nRBC: 0 % (ref 0.0–0.2)

## 2022-04-11 LAB — BLOOD CULTURE ID PANEL (REFLEXED) - BCID2

## 2022-04-11 LAB — PROCALCITONIN: Procalcitonin: <0.1 ng/mL

## 2022-04-11 LAB — CREATININE, SERUM
Creatinine, Ser: 1.41 mg/dL — ABNORMAL HIGH (ref 0.44–1.00)
GFR, Estimated: 41 mL/min — ABNORMAL LOW (ref >60)

## 2022-04-11 LAB — SEDIMENTATION RATE: Sed Rate: 37 mm/hr — ABNORMAL HIGH (ref 0–30)

## 2022-04-11 MED ORDER — INFLUENZA VAC A&B SA ADJ QUAD 0.5 ML IM PRSY
0.5000 mL | PREFILLED_SYRINGE | INTRAMUSCULAR | Status: AC
  Administered 2022-04-13: 0.5 mL via INTRAMUSCULAR
  Filled 2022-04-11: qty 0.5

## 2022-04-11 MED ORDER — SODIUM CHLORIDE 0.9 % IV SOLN: INTRAVENOUS | Status: DC

## 2022-04-11 MED ORDER — ACETAMINOPHEN 325 MG PO TABS
650.0000 mg | ORAL_TABLET | Freq: Four times a day (QID) | ORAL | Status: DC | PRN
  Administered 2022-04-12: 650 mg via ORAL
  Filled 2022-04-11: qty 2

## 2022-04-11 MED ORDER — ALBUTEROL SULFATE (2.5 MG/3ML) 0.083% IN NEBU: 2.5000 mg | INHALATION_SOLUTION | RESPIRATORY_TRACT | Status: DC | PRN

## 2022-04-11 MED ORDER — METHYLPREDNISOLONE SODIUM SUCC 40 MG IJ SOLR
40.0000 mg | Freq: Every day | INTRAMUSCULAR | Status: DC
  Administered 2022-04-11 – 2022-04-13 (×3): 40 mg via INTRAVENOUS
  Filled 2022-04-11 (×3): qty 1

## 2022-04-11 MED ORDER — SODIUM CHLORIDE 0.9 % IV BOLUS
500.0000 mL | Freq: Once | INTRAVENOUS | Status: AC
  Administered 2022-04-11: 500 mL via INTRAVENOUS

## 2022-04-11 MED ORDER — PNEUMOCOCCAL 20-VAL CONJ VACC 0.5 ML IM SUSY
0.5000 mL | PREFILLED_SYRINGE | INTRAMUSCULAR | Status: AC
  Administered 2022-04-13: 0.5 mL via INTRAMUSCULAR
  Filled 2022-04-11: qty 0.5

## 2022-04-11 MED ORDER — ACETAMINOPHEN 650 MG RE SUPP: 650.0000 mg | Freq: Four times a day (QID) | RECTAL | Status: DC | PRN

## 2022-04-11 MED ORDER — SODIUM CHLORIDE 0.9 % IV SOLN
2.0000 g | INTRAVENOUS | Status: DC
  Administered 2022-04-12 – 2022-04-13 (×2): 2 g via INTRAVENOUS
  Filled 2022-04-11: qty 20
  Filled 2022-04-11: qty 2

## 2022-04-11 MED ORDER — SODIUM CHLORIDE 0.9 % IV SOLN: 500.0000 mg | INTRAVENOUS | Status: DC

## 2022-04-11 MED ORDER — ONDANSETRON HCL 4 MG/2ML IJ SOLN: 4.0000 mg | Freq: Four times a day (QID) | INTRAMUSCULAR | Status: DC | PRN

## 2022-04-11 MED ORDER — SODIUM CHLORIDE 0.9 % IV SOLN
2.0000 g | Freq: Once | INTRAVENOUS | Status: AC
  Administered 2022-04-11: 2 g via INTRAVENOUS
  Filled 2022-04-11: qty 20

## 2022-04-11 MED ORDER — SODIUM CHLORIDE 0.9 % IV SOLN
500.0000 mg | Freq: Once | INTRAVENOUS | Status: AC
  Administered 2022-04-11: 500 mg via INTRAVENOUS
  Filled 2022-04-11: qty 5

## 2022-04-11 MED ORDER — ENOXAPARIN SODIUM 60 MG/0.6ML IJ SOSY
0.5000 mg/kg | PREFILLED_SYRINGE | INTRAMUSCULAR | Status: DC
  Administered 2022-04-11 – 2022-04-13 (×3): 50 mg via SUBCUTANEOUS
  Filled 2022-04-11 (×3): qty 0.6

## 2022-04-11 MED ORDER — GUAIFENESIN ER 600 MG PO TB12
600.0000 mg | ORAL_TABLET | Freq: Two times a day (BID) | ORAL | Status: DC
  Administered 2022-04-11 – 2022-04-13 (×6): 600 mg via ORAL
  Filled 2022-04-11 (×6): qty 1

## 2022-04-11 MED ORDER — MORPHINE SULFATE (PF) 2 MG/ML IV SOLN
2.0000 mg | INTRAVENOUS | Status: DC | PRN
  Administered 2022-04-13: 2 mg via INTRAVENOUS
  Filled 2022-04-11: qty 1

## 2022-04-11 MED ORDER — ONDANSETRON HCL 4 MG PO TABS: 4.0000 mg | ORAL_TABLET | Freq: Four times a day (QID) | ORAL | Status: DC | PRN

## 2022-04-11 MED ORDER — AZITHROMYCIN 500 MG PO TABS
500.0000 mg | ORAL_TABLET | Freq: Every day | ORAL | Status: DC
  Administered 2022-04-11 – 2022-04-12 (×2): 500 mg via ORAL
  Filled 2022-04-11 (×2): qty 1

## 2022-04-11 MED ORDER — HYDROCODONE-ACETAMINOPHEN 5-325 MG PO TABS
1.0000 | ORAL_TABLET | ORAL | Status: DC | PRN
  Administered 2022-04-11: 2 via ORAL
  Administered 2022-04-11: 1 via ORAL
  Administered 2022-04-11 – 2022-04-13 (×6): 2 via ORAL
  Filled 2022-04-11 (×5): qty 2
  Filled 2022-04-11: qty 1
  Filled 2022-04-11 (×2): qty 2

## 2022-04-11 NOTE — Plan of Care (Signed)
  Problem: Pain Managment: Goal: General experience of comfort will improve Outcome: Progressing   Problem: Safety: Goal: Ability to remain free from injury will improve Outcome: Progressing   Problem: Skin Integrity: Goal: Risk for impaired skin integrity will decrease Outcome: Progressing   

## 2022-04-11 NOTE — Assessment & Plan Note (Addendum)
Start Solu-Medrol.  Incentive spirometer.  Nebulizer treatments.

## 2022-04-11 NOTE — ED Provider Notes (Addendum)
12:00 AM  Assumed care at shift change.  Patient here for right-sided chest pain.  Imaging shows right upper lobe pneumonia and she has been hypoxic to 85%.  COVID-negative.  MRI ordered to rule out dissection given contrast allergy.  I have ordered antibiotics for community-acquired pneumonia and blood cultures.  Patient will need hospitalization due to hypoxia without oxygen requirement at home.   1:12 AM  Pt's MRI chest reviewed and interpreted by myself and the radiologist and shows no dissection but again redemonstrates the right upper lobe pneumonia.  Patient agrees to admission.    Consulted and discussed patient's case with hospitalist, Dr. Damita Dunnings.  I have recommended admission and consulting physician agrees and will place admission orders.  Patient (and family if present) agree with this plan.   I reviewed all nursing notes, vitals, pertinent previous records.  All labs, EKGs, imaging ordered have been independently reviewed and interpreted by myself.      CRITICAL CARE Performed by: Pryor Curia   Total critical care time: 35 minutes  Critical care time was exclusive of separately billable procedures and treating other patients.  Critical care was necessary to treat or prevent imminent or life-threatening deterioration.  Critical care was time spent personally by me on the following activities: development of treatment plan with patient and/or surrogate as well as nursing, discussions with consultants, evaluation of patient's response to treatment, examination of patient, obtaining history from patient or surrogate, ordering and performing treatments and interventions, ordering and review of laboratory studies, ordering and review of radiographic studies, pulse oximetry and re-evaluation of patient's condition.    Codi Folkerts, Delice Bison, DO 04/11/22 [id number]

## 2022-04-11 NOTE — Progress Notes (Signed)
  Progress Note   Patient: Michelle Rios TDV:[id number] DOB: 1955/08/26 DOA: 04/10/2022     0 DOS: the patient was seen and examined on 04/11/2022     Assessment and Plan: * Right-sided chest pain, pleuritic Start Solu-Medrol.  Incentive spirometer.  Nebulizer treatments.  CAP (community acquired pneumonia) Rocephin and Zithromax prescribed  Acute respiratory failure with hypoxia (HCC) Pulse ox of 85% on room air.  Currently on 3 L saturating well    Sarcoidosis of lung ([place]) Steroids should help.  AKI (acute kidney injury) ([place]) Creatinine went up to 1.41.  We will give IV fluid bolus and IV fluids and recheck creatinine tomorrow morning.  Vitamin D deficiency Patient on vitamin D supplementation  Anemia Hemoglobin 11.6  Obesity, Class III, BMI 40-49.9 (morbid obesity) (HCC) BMI 41.57  Essential hypertension Last blood pressure normal range.  Initial blood pressures may be elevated secondary to pain.        Subjective: Patient complaining of right flank pain.  Worse with movement and taking a deep breath.  Found to have pneumonia.  History of sarcoidosis.  Physical Exam: Vitals:   04/11/22 0100 04/11/22 0115 04/11/22 0210 04/11/22 0816  BP: 109/81  133/80 132/67  Pulse: 75 66 61 62  Resp: (!) 8 11 16 15   Temp:   (!) 97.4 F (36.3 C) (!) 97.4 F (36.3 C)  TempSrc:    Oral  SpO2: 99%  100% 100%  Weight:      Height:       Physical Exam HENT:     Head: Normocephalic.     Mouth/Throat:     Pharynx: No oropharyngeal exudate.  Eyes:     General: Lids are normal.     Conjunctiva/sclera: Conjunctivae normal.  Cardiovascular:     Rate and Rhythm: Normal rate and regular rhythm.     Heart sounds: Normal heart sounds, S1 normal and S2 normal.  Pulmonary:     Breath sounds: Examination of the right-lower field reveals decreased breath sounds. Examination of the left-lower field reveals decreased breath sounds. Decreased breath sounds present. No wheezing,  rhonchi or rales.  Abdominal:     Palpations: Abdomen is soft.     Tenderness: There is no abdominal tenderness.  Musculoskeletal:     Right lower leg: No swelling.     Left lower leg: No swelling.  Skin:    General: Skin is warm.     Findings: No rash.  Neurological:     Mental Status: She is alert and oriented to person, place, and time.     Data Reviewed: CT scan showing right upper lobe pneumonia Hemoglobin 11.6, creatinine up at 1.41  Family Communication: Declined  Disposition: Status is: Inpatient Remains inpatient appropriate because: Patient with acute hypoxic respiratory failure, pleuritic chest pain requiring IV steroids.  On IV antibiotics for pneumonia  Planned Discharge Destination: Home    Time spent: 28 minutes  Author: Loletha Grayer, MD 04/11/2022 1:54 PM  For on call review [email or web address].

## 2022-04-11 NOTE — Assessment & Plan Note (Addendum)
BMI 41.57

## 2022-04-11 NOTE — Assessment & Plan Note (Signed)
Steroids should help 

## 2022-04-11 NOTE — Assessment & Plan Note (Addendum)
Last blood pressure normal range.  Initial blood pressures may be elevated secondary to pain.

## 2022-04-11 NOTE — Progress Notes (Signed)
PHARMACY - PHYSICIAN COMMUNICATION CRITICAL VALUE ALERT - BLOOD CULTURE IDENTIFICATION (BCID)  Michelle Rios is an 67 y.o. female who presented to [facility] on 04/10/2022 with a chief complaint of chest pain CAP  Assessment:  Staph epi in 1 of 4 bottles, no resistance detected.  Most likely a contaminant. (include suspected source if known)  Name of physician (or Provider) Contacted: Damita Dunnings  Current antibiotics: Azithromycin, Ceftriaxone   Changes to prescribed antibiotics recommended:  Patient is on recommended antibiotics - No changes needed  Results for orders placed or performed during the hospital encounter of 04/10/22  Blood Culture ID Panel (Reflexed) (Collected: 04/11/2022 12:25 AM)  Result Value Ref Range   Enterococcus faecalis NOT DETECTED NOT DETECTED   Enterococcus Faecium NOT DETECTED NOT DETECTED   Listeria monocytogenes NOT DETECTED NOT DETECTED   Staphylococcus species DETECTED (A) NOT DETECTED   Staphylococcus aureus (BCID) NOT DETECTED NOT DETECTED   Staphylococcus epidermidis DETECTED (A) NOT DETECTED   Staphylococcus lugdunensis NOT DETECTED NOT DETECTED   Streptococcus species NOT DETECTED NOT DETECTED   Streptococcus agalactiae NOT DETECTED NOT DETECTED   Streptococcus pneumoniae NOT DETECTED NOT DETECTED   Streptococcus pyogenes NOT DETECTED NOT DETECTED   A.calcoaceticus-baumannii NOT DETECTED NOT DETECTED   Bacteroides fragilis NOT DETECTED NOT DETECTED   Enterobacterales NOT DETECTED NOT DETECTED   Enterobacter cloacae complex NOT DETECTED NOT DETECTED   Escherichia coli NOT DETECTED NOT DETECTED   Klebsiella aerogenes NOT DETECTED NOT DETECTED   Klebsiella oxytoca NOT DETECTED NOT DETECTED   Klebsiella pneumoniae NOT DETECTED NOT DETECTED   Proteus species NOT DETECTED NOT DETECTED   Salmonella species NOT DETECTED NOT DETECTED   Serratia marcescens NOT DETECTED NOT DETECTED   Haemophilus influenzae NOT DETECTED NOT DETECTED   Neisseria  meningitidis NOT DETECTED NOT DETECTED   Pseudomonas aeruginosa NOT DETECTED NOT DETECTED   Stenotrophomonas maltophilia NOT DETECTED NOT DETECTED   Candida albicans NOT DETECTED NOT DETECTED   Candida auris NOT DETECTED NOT DETECTED   Candida glabrata NOT DETECTED NOT DETECTED   Candida krusei NOT DETECTED NOT DETECTED   Candida parapsilosis NOT DETECTED NOT DETECTED   Candida tropicalis NOT DETECTED NOT DETECTED   Cryptococcus neoformans/gattii NOT DETECTED NOT DETECTED   Methicillin resistance mecA/C NOT DETECTED NOT DETECTED    [name] D 04/11/2022  10:47 PM

## 2022-04-11 NOTE — Plan of Care (Signed)
  Problem: Education: Goal: Knowledge of General Education information will improve Description: Including pain rating scale, medication(s)/side effects and non-pharmacologic comfort measures Outcome: Progressing   Problem: Nutrition: Goal: Adequate nutrition will be maintained Outcome: Progressing   Problem: Safety: Goal: Ability to remain free from injury will improve Outcome: Progressing   Problem: Activity: Goal: Ability to tolerate increased activity will improve Outcome: Progressing

## 2022-04-11 NOTE — H&P (Addendum)
History and Physical    Patient: Michelle Rios:[id number] DOB: 1956-07-23 DOA: 04/10/2022 DOS: the patient was seen and examined on 04/11/2022 PCP: Pcp, No  Patient coming from: Home  Chief Complaint:  Chief Complaint  Patient presents with   Chest Pain    HPI: [name] is a 66 y.o. female with medical history significant for HTN, morbid obesity, with history of episodes of chest pain going back as far as the 90s most recently in 2001, treated as sarcoidosis with differential of lupus last seen by pulmonology in 2019 when sarcoidosis was effectively ruled out (per chart review )who presents to the ED with sudden onset right-sided lateral chest pain of severe intensity, radiating to back.  The pain is worse with deep breath and movement.  She denies cough, fever or chills or palpitations.  She has bilateral lower extremity edema but denies pain in the legs. ED course and data review: BP 192/92 on arrival.  Pulse in the 50s.  O2 sat was initially 100% but then dropped to 85% on room air requiring 3 L to maintain sats in the high 90s.  Labs significant only for hemoglobin of 11.4 which appears to be her baseline.  Pertinent negatives include normal WBC, procalcitonin less than 0.1, troponin 3, BNP 30 and COVID-negative.  CMP completely WNL.  EKG, personally viewed and interpreted showing sinus at 73 with nonspecific ST-T wave changes. Imaging: Due to contrast allergy patient had MR angio of the chest which was normal.  CT chest abdomen and pelvis without contrast showed the following: IMPRESSION: 1. Patchy ground-glass opacities in the right upper lobe, likely infectious/inflammatory. Follow-up chest CT recommended in 2 months to re-evaluate. 2. Subcentimeter incidental left thyroid nodule. No follow-up imaging is recommended. Reference: J Am Coll Radiol. 2015 Feb;12(2): 143-50 3. Central mesenteric haziness with lymphadenopathy may be related to infection/inflammation. Other  etiologies (neoplastic) can not be excluded in the appropriate clinical setting. 4. Colonic diverticulosis.  RUQ ultrasound showed the following: IMPRESSION: 1. Hepatic steatosis. Please note limited evaluation for focal hepatic masses in a patient with hepatic steatosis due to decreased penetration of the acoustic ultrasound waves. 2. Limited evaluation of left hepatic lobe.    Bilateral lower extremity Doppler negative for DVT  Patient was started on Rocephin and azithromycin.  Her pain was treated with repeated doses of hydromorphone as well as morphine.  She was placed on O2 at 3 L hospitalist consulted for admission.   Review of Systems: As mentioned in the history of present illness. All other systems reviewed and are negative.  Past Medical History:  Diagnosis Date   AKI (acute kidney injury) (HCC) 09/15/2015   Anemia    Arthritis    "knees" (02/20/2018)   Asthma    Bilateral swelling of feet    Constipation    Daily headache    Diarrhea 09/15/2015   Hiatal hernia    History of blood transfusion 1979   w/childbirth   Hypertension    Lactose intolerance    Osteoarthritis    Pulmonary embolism (HCC) 1990s X 1   Rheumatoid arthritis (HCC)    Sarcoidosis of lung (HCC)    SOB (shortness of breath)    Past Surgical History:  Procedure Laterality Date   ANKLE FRACTURE SURGERY Right    KNEE ARTHROSCOPY Right 1990s   TUBAL LIGATION  1990s   VAGINAL HYSTERECTOMY  1990s   WRIST FRACTURE SURGERY Left 2001   Social History:  reports that she has never smoked.  She has never used smokeless tobacco. She reports that she does not drink alcohol and does not use drugs.  Allergies  Allergen Reactions   Ivp Dye [Iodinated Contrast Media] Anaphylaxis and Swelling   Voltaren [Diclofenac Sodium] Other (See Comments)    Pt states had bad state in mouth, mouth and tongue turned black, and then pt vomited thick white mucous.    Family History  Problem Relation Age of Onset    Diabetes Mother    Kidney disease Mother    Hyperlipidemia Father    Hypertension Father    Heart attack Father    Heart disease Father    Sudden death Father    Alcoholism Father    Breast cancer Sister    Kidney cancer Sister    Diabetes Brother    Stroke Paternal Grandmother     Prior to Admission medications   Medication Sig Start Date End Date Taking? Authorizing Provider  Vitamin D, Ergocalciferol, (DRISDOL) 1.25 MG (50000 UNIT) CAPS capsule Take 1 capsule (50,000 Units total) by mouth every 7 (seven) days. 08/18/21   Langston Reusing, MD    Physical Exam: Vitals:   04/10/22 2200 04/10/22 2230 04/11/22 0100 04/11/22 0115  BP: (!) 169/93 139/65 109/81   Pulse: (!) 55 (!) 52 75 66  Resp: 10 11 (!) 8 11  Temp:  97.8 F (36.6 C)    TempSrc:  Oral    SpO2: 100% 99% 99%   Weight:      Height:       Physical Exam Vitals and nursing note reviewed.  Constitutional:      General: She is not in acute distress. HENT:     Head: Normocephalic and atraumatic.  Cardiovascular:     Rate and Rhythm: Normal rate and regular rhythm.     Heart sounds: Normal heart sounds.  Pulmonary:     Effort: Pulmonary effort is normal.     Breath sounds: Normal breath sounds.  Chest:     Comments: Pain lateral right lower chest radiating to her right upper abdomen and posteriorly Abdominal:     Palpations: Abdomen is soft.     Tenderness: There is no abdominal tenderness.  Neurological:     Mental Status: Mental status is at baseline.     Labs on Admission: I have personally reviewed following labs and imaging studies  CBC: Recent Labs  Lab 04/10/22 1927  WBC 5.9  NEUTROABS 3.7  HGB 11.4*  HCT 35.1*  MCV 99.2  PLT 344   Basic Metabolic Panel: Recent Labs  Lab 04/10/22 1927  NA 141  K 3.8  CL 103  CO2 27  GLUCOSE 91  BUN 14  CREATININE 0.92  CALCIUM 9.3   GFR: Estimated Creatinine Clearance: 65.1 mL/min (by C-G formula based on SCr of 0.92 mg/dL). Liver  Function Tests: Recent Labs  Lab 04/10/22 1927  AST 20  ALT 13  ALKPHOS 81  BILITOT 0.7  PROT 8.0  ALBUMIN 4.0   Recent Labs  Lab 04/10/22 1927  LIPASE 26   No results for input(s): "AMMONIA" in the last 168 hours. Coagulation Profile: No results for input(s): "INR", "PROTIME" in the last 168 hours. Cardiac Enzymes: No results for input(s): "CKTOTAL", "CKMB", "CKMBINDEX", "TROPONINI" in the last 168 hours. BNP (last 3 results) No results for input(s): "PROBNP" in the last 8760 hours. HbA1C: No results for input(s): "HGBA1C" in the last 72 hours. CBG: No results for input(s): "GLUCAP" in the last 168 hours. Lipid Profile:  No results for input(s): "CHOL", "HDL", "LDLCALC", "TRIG", "CHOLHDL", "LDLDIRECT" in the last 72 hours. Thyroid Function Tests: No results for input(s): "TSH", "T4TOTAL", "FREET4", "T3FREE", "THYROIDAB" in the last 72 hours. Anemia Panel: No results for input(s): "VITAMINB12", "FOLATE", "FERRITIN", "TIBC", "IRON", "RETICCTPCT" in the last 72 hours. Urine analysis:    Component Value Date/Time   COLORURINE YELLOW 08/31/2018 1041   APPEARANCEUR HAZY (A) 08/31/2018 1041   LABSPEC 1.017 08/31/2018 1041   PHURINE 5.0 08/31/2018 1041   GLUCOSEU NEGATIVE 08/31/2018 1041   [place] 08/31/2018 1041   [place] 08/31/2018 [place] 08/31/2018 1041   PROTEINUR NEGATIVE 08/31/2018 1041   UROBILINOGEN 1.0 08/30/2009 0615   NITRITE NEGATIVE 08/31/2018 1041   LEUKOCYTESUR NEGATIVE 08/31/2018 1041    Radiological Exams on Admission: MR ANGIO CHEST W WO CONTRAST  Result Date: 04/11/2022 CLINICAL DATA:  Chest pain EXAM: MRA CHEST WITH AND WITHOUT CONTRAST TECHNIQUE: Angiographic images of the chest were obtained using MRA technique chest CT 04/10/2022 intravenous contrast. CONTRAST:  64mL GADAVIST GADOBUTROL 1 MMOL/ML IV SOLN COMPARISON:  None Available. FINDINGS: VASCULAR Aorta: Normal course and caliber of the aorta.  No  dissection. Heart: Heart size is normal. Pulmonary Arteries:  Normal pulmonary arteries. Other: None NON-VASCULAR Mediastinum: Unremarkable.  No lymphadenopathy. Lungs: Patchy areas hyperintense signal in the right parahilar region Bones: Unremarkable. IMPRESSION: 1. Normal MRA of the thoracic aorta. 2. Patchy areas of hyperintense signal in the right parahilar region, better characterized on earlier chest CT. Electronically Signed   By: Ulyses Jarred M.D.   On: 04/11/2022 00:06   US ABDOMEN LIMITED RUQ (LIVER/GB)  Result Date: 04/10/2022 CLINICAL DATA:  [id number].  Right upper quadrant pain.  Obesity. EXAM: ULTRASOUND ABDOMEN LIMITED RIGHT UPPER QUADRANT COMPARISON:  None Available. FINDINGS: Gallbladder: No gallstones or wall thickening visualized. No sonographic Murphy sign noted by sonographer. Common bile duct: Diameter: 3 mm. Liver: No focal lesion identified. Increased parenchymal echogenicity. Portal vein is patent on color Doppler imaging with normal direction of blood flow towards the liver. Other: None. IMPRESSION: 1. Hepatic steatosis. Please note limited evaluation for focal hepatic masses in a patient with hepatic steatosis due to decreased penetration of the acoustic ultrasound waves. 2. Limited evaluation of left hepatic lobe. Electronically Signed   By: Iven Finn M.D.   On: 04/10/2022 23:20   US Venous Img Lower Bilateral  Result Date: 04/10/2022 CLINICAL DATA:  Leg swelling. EXAM: BILATERAL LOWER EXTREMITY VENOUS DOPPLER ULTRASOUND TECHNIQUE: Gray-scale sonography with graded compression, as well as color Doppler and duplex ultrasound were performed to evaluate the lower extremity deep venous systems from the level of the common femoral vein and including the common femoral, femoral, profunda femoral, popliteal and calf veins including the posterior tibial, peroneal and gastrocnemius veins when visible. The superficial great saphenous vein was also interrogated. Spectral Doppler was  utilized to evaluate flow at rest and with distal augmentation maneuvers in the common femoral, femoral and popliteal veins. COMPARISON:  None Available. FINDINGS: RIGHT LOWER EXTREMITY Common Femoral Vein: No evidence of thrombus. Normal compressibility, respiratory phasicity and response to augmentation. Saphenofemoral Junction: No evidence of thrombus. Normal compressibility and flow on color Doppler imaging. Profunda Femoral Vein: No evidence of thrombus. Normal compressibility and flow on color Doppler imaging. Femoral Vein: No evidence of thrombus. Normal compressibility, respiratory phasicity and response to augmentation. Popliteal Vein: No evidence of thrombus. Normal compressibility, respiratory phasicity and response to augmentation. Calf Veins: No evidence of thrombus. Normal compressibility and flow on  color Doppler imaging. Superficial Great Saphenous Vein: No evidence of thrombus. Normal compressibility. Venous Reflux:  None. Other Findings:  None. LEFT LOWER EXTREMITY Common Femoral Vein: No evidence of thrombus. Normal compressibility, respiratory phasicity and response to augmentation. Saphenofemoral Junction: No evidence of thrombus. Normal compressibility and flow on color Doppler imaging. Profunda Femoral Vein: No evidence of thrombus. Normal compressibility and flow on color Doppler imaging. Femoral Vein: No evidence of thrombus. Normal compressibility, respiratory phasicity and response to augmentation. Popliteal Vein: No evidence of thrombus. Normal compressibility, respiratory phasicity and response to augmentation. Calf Veins: No evidence of thrombus. Normal compressibility and flow on color Doppler imaging. Superficial Great Saphenous Vein: No evidence of thrombus. Normal compressibility. Venous Reflux:  None. Other Findings:  None. IMPRESSION: No evidence of deep venous thrombosis in either lower extremity. Electronically Signed   By: Darliss Cheney M.D.   On: 04/10/2022 23:08   CT CHEST  ABDOMEN PELVIS WO CONTRAST  Result Date: 04/10/2022 CLINICAL DATA:  Right-sided chest pain. EXAM: CT CHEST, ABDOMEN AND PELVIS WITHOUT CONTRAST TECHNIQUE: Multidetector CT imaging of the chest, abdomen and pelvis was performed following the standard protocol without IV contrast. RADIATION DOSE REDUCTION: This exam was performed according to the departmental dose-optimization program which includes automated exposure control, adjustment of the mA and/or kV according to patient size and/or use of iterative reconstruction technique. COMPARISON:  CT abdomen and pelvis 02/20/2018 FINDINGS: CT CHEST FINDINGS Cardiovascular: Heart is borderline enlarged. Aorta is normal in size. There are atherosclerotic calcifications of the aorta. Mediastinum/Nodes: There is a 6 mm hypodense left thyroid nodule. No enlarged mediastinal lymph nodes are seen. Difficult to assess for hilar adenopathy secondary to lack of contrast. Esophagus is within normal limits. Lungs/Pleura: There are patchy ground-glass opacities throughout the right upper lobe. Mild emphysematous changes are seen. The lungs are otherwise clear. No pleural effusion or pneumothorax identified. Musculoskeletal: There is DISH of the thoracic spine. CT ABDOMEN PELVIS FINDINGS Hepatobiliary: No focal liver abnormality is seen. No gallstones, gallbladder wall thickening, or biliary dilatation. Pancreas: Unremarkable. No pancreatic ductal dilatation or surrounding inflammatory changes. Spleen: Normal in size without focal abnormality. Adrenals/Urinary Tract: Adrenal glands are unremarkable. Kidneys are normal, without renal calculi, focal lesion, or hydronephrosis. Bladder is unremarkable. Stomach/Bowel: Stomach is within normal limits. Appendix appears normal. No evidence of bowel wall thickening, distention, or inflammatory changes. There is sigmoid and descending colon diverticulosis. Vascular/Lymphatic: Aorta and IVC are normal in size. There are atherosclerotic  calcifications of the aorta. There are nonenlarged and mildly enlarged central mesenteric lymph nodes measuring up to 11 mm with some central mesenteric haziness. This is a new finding. Reproductive: Status post hysterectomy. No adnexal masses. Other: No abdominal wall hernia or abnormality. No abdominopelvic ascites. Musculoskeletal: No acute or significant osseous findings. IMPRESSION: 1. Patchy ground-glass opacities in the right upper lobe, likely infectious/inflammatory. Follow-up chest CT recommended in 2 months to re-evaluate. 2. Subcentimeter incidental left thyroid nodule. No follow-up imaging is recommended. Reference: J Am Coll Radiol. 2015 Feb;12(2): 143-50 3. Central mesenteric haziness with lymphadenopathy may be related to infection/inflammation. Other etiologies (neoplastic) can not be excluded in the appropriate clinical setting. 4. Colonic diverticulosis. Electronically Signed   By: Darliss Cheney M.D.   On: 04/10/2022 21:22   DG Chest Portable 1 View  Result Date: 04/10/2022 CLINICAL DATA:  Chest pain. EXAM: PORTABLE CHEST 1 VIEW COMPARISON:  Chest x-ray 09/29/2018 FINDINGS: The heart size and mediastinal contours are within normal limits. Both lungs are clear. The visualized skeletal structures  are unremarkable. IMPRESSION: No active disease. Electronically Signed   By: Darliss Cheney M.D.   On: 04/10/2022 19:44     Data Reviewed: Relevant notes from primary care and specialist visits, past discharge summaries as available in EHR, including Care Everywhere. Prior diagnostic testing as pertinent to current admission diagnoses Updated medications and problem lists for reconciliation ED course, including vitals, labs, imaging, treatment and response to treatment Triage notes, nursing and pharmacy notes and ED provider's notes Notable results as noted in HPI   Assessment and Plan: * Right-sided chest pain, pleuritic -Pleuritic chest pain, previously treated as sarcoidosis. Once told  she might have lupus -Will get sed rate  - Imaging negative for dissection or gallbladder disease but showing following findings:   "Patchy ground-glass opacities in the right upper lobe, likely Infectious/inflammatory.Marland KitchenMarland KitchenCentral mesenteric haziness with lymphadenopathy may be related to infection/inflammation. Other etiologies (neoplastic) can not be excluded in the appropriate clinical setting " -Continue pain control with Toradol, as needed opiates -Consider pulmonology consult  Acute respiratory failure with hypoxia (HCC) Probable pneumonia CT chest showed  "Patchy ground-glass opacities in the right upper lobe, likely Infectious/inflammatory" Patient has no cough or fever and WBC and procalcitonin negative.  COVID-negative Patient got Rocephin and azithromycin in the ED so unlikely bacterial infection, but will continue in view of respiratory failure We will get respiratory viral panel Patient had sarcoidosis ruled out on her last pulmonology visit in 2019 Consider pulmonology consult    CAP (community acquired pneumonia)    Anemia Appears chronic and stable  Obesity, Class III, BMI 40-49.9 (morbid obesity) (HCC) Complicates overall prognosis and care  Essential hypertension Initially elevated likely due to pain as it became controlled with pain control    DVT prophylaxis: Lovenox  Consults: none  Advance Care Planning:   Code Status: Prior   Family Communication: none  Disposition Plan: Back to previous home environment  Severity of Illness: The appropriate patient status for this patient is INPATIENT. Inpatient status is judged to be reasonable and necessary in order to provide the required intensity of service to ensure the patient's safety. The patient's presenting symptoms, physical exam findings, and initial radiographic and laboratory data in the context of their chronic comorbidities is felt to place them at high risk for further clinical deterioration.  Furthermore, it is not anticipated that the patient will be medically stable for discharge from the hospital within 2 midnights of admission.   * I certify that at the point of admission it is my clinical judgment that the patient will require inpatient hospital care spanning beyond 2 midnights from the point of admission due to high intensity of service, high risk for further deterioration and high frequency of surveillance required.*  Author: Andris Baumann, MD 04/11/2022 1:36 AM  For on call review [email or web address].

## 2022-04-11 NOTE — Assessment & Plan Note (Signed)
Patient on vitamin D supplementation

## 2022-04-11 NOTE — Assessment & Plan Note (Signed)
Creatinine went up to 1.41.  We will give IV fluid bolus and IV fluids and recheck creatinine tomorrow morning.

## 2022-04-11 NOTE — Progress Notes (Signed)
PHARMACIST - PHYSICIAN COMMUNICATION  CONCERNING: Antibiotic IV to Oral Route Change Policy  RECOMMENDATION: This patient is receiving azithromycin by the intravenous route.  Based on criteria approved by the Pharmacy and Therapeutics Committee, the antibiotic(s) is/are being converted to the equivalent oral dose form(s).   DESCRIPTION: These criteria include: Patient being treated for a respiratory tract infection, urinary tract infection, cellulitis or clostridium difficile associated diarrhea if on metronidazole The patient is not neutropenic and does not exhibit a GI malabsorption state The patient is eating (either orally or via tube) and/or has been taking other orally administered medications for a least 24 hours The patient is improving clinically and has a Tmax < 100.5  If you have questions about this conversion, please contact the Pharmacy Department   Rhianna Raulerson B Christan Defranco  04/11/22    

## 2022-04-11 NOTE — Progress Notes (Signed)
Pt has not voided, bladder scan 43 ml. Pt states " she usually urinates about once a day in the evening", and that "this is normal for her". MD notified. MD increased cont. NS fluids.

## 2022-04-11 NOTE — Assessment & Plan Note (Addendum)
Hemoglobin 11.6

## 2022-04-11 NOTE — Assessment & Plan Note (Addendum)
Rocephin and Zithromax prescribed.  COVID and viral respiratory panel negative.

## 2022-04-11 NOTE — Assessment & Plan Note (Addendum)
Pulse ox of 85% on room air.  Currently on 3 L saturating well.  Check pulse ox with ambulation to see if patient can come off oxygen.

## 2022-04-12 ENCOUNTER — Inpatient Hospital Stay: Payer: Medicare HMO

## 2022-04-12 DIAGNOSIS — J9601 Acute respiratory failure with hypoxia: Secondary | ICD-10-CM | POA: Diagnosis not present

## 2022-04-12 DIAGNOSIS — D860 Sarcoidosis of lung: Secondary | ICD-10-CM | POA: Diagnosis not present

## 2022-04-12 DIAGNOSIS — R079 Chest pain, unspecified: Secondary | ICD-10-CM | POA: Diagnosis not present

## 2022-04-12 DIAGNOSIS — J189 Pneumonia, unspecified organism: Secondary | ICD-10-CM | POA: Diagnosis not present

## 2022-04-12 LAB — BASIC METABOLIC PANEL
Anion gap: 6 (ref 5–15)
BUN: 24 mg/dL — ABNORMAL HIGH (ref 8–23)
CO2: 27 mmol/L (ref 22–32)
Calcium: 8.8 mg/dL — ABNORMAL LOW (ref 8.9–10.3)
Chloride: 105 mmol/L (ref 98–111)
Creatinine, Ser: 0.89 mg/dL (ref 0.44–1.00)
GFR, Estimated: >60 mL/min (ref >60)
Glucose, Bld: 100 mg/dL — ABNORMAL HIGH (ref 70–99)
Potassium: 4.4 mmol/L (ref 3.5–5.1)
Sodium: 138 mmol/L (ref 135–145)

## 2022-04-12 LAB — CBC
HCT: 31.4 % — ABNORMAL LOW (ref 36.0–46.0)
Hemoglobin: 10 g/dL — ABNORMAL LOW (ref 12.0–15.0)
MCH: 32.7 pg (ref 26.0–34.0)
MCHC: 31.8 g/dL (ref 30.0–36.0)
MCV: 102.6 fL — ABNORMAL HIGH (ref 80.0–100.0)
Platelets: 304 10*3/uL (ref 150–400)
RBC: 3.06 MIL/uL — ABNORMAL LOW (ref 3.87–5.11)
RDW: 11.7 % (ref 11.5–15.5)
WBC: 5.9 10*3/uL (ref 4.0–10.5)
nRBC: 0 % (ref 0.0–0.2)

## 2022-04-12 LAB — FERRITIN: Ferritin: 128 ng/mL (ref 11–307)

## 2022-04-12 LAB — HIV ANTIBODY (ROUTINE TESTING W REFLEX): HIV Screen 4th Generation wRfx: NONREACTIVE

## 2022-04-12 NOTE — Evaluation (Signed)
Physical Therapy Evaluation Patient Details Name: Michelle Rios MRN: [id number] DOB: 03-22-1956 Today's Date: 04/12/2022  History of Present Illness  Michelle Rios is a 49yoF who comes to [facility] on 9/16 with acute onset CP 10/10. PMH: HTN, morbid obesity, episodic CP.  Clinical Impression  Pt admitted with above Dx. Pt has functional limitations due to deficits below (see "PT Problem List"). Pt able to provide details on baseline functional status. Today pt requires RW and labored pace to perform all functional movements due to severe ongoing Rt lateral thoracic pain. Pt denies acute weakness or imbalance, but is concerned about how pain may affect her ability to be safe while moving. Pt able to feel more confident AMB with RW, would like one for home use, but says in her current state she would not be able to navigate entry stairs to get into her home because of pain. Patient is near baseline independence in that she can perform her mobility unassisted. All education completed, and time is given to address all questions/concerns. Pt reports she is able to meet her mobility needs on her own while admitted, no need for PT services while admitted nor at DC. No additional skilled PT services needed at this time, PT signing off. PT recommends daily ambulation ad lib or with nursing staff as needed to prevent deconditioning.       Recommendations for follow up therapy are one component of a multi-disciplinary discharge planning process, led by the attending physician.  Recommendations may be updated based on patient status, additional functional criteria and insurance authorization.  Follow Up Recommendations No PT follow up      Assistance Recommended at Discharge Set up Supervision/Assistance  Patient can return home with the following       Equipment Recommendations Rolling walker (2 wheels)  Recommendations for Other Services       Functional Status Assessment Patient has had a recent  decline in their functional status and demonstrates the ability to make significant improvements in function in a reasonable and predictable amount of time.     Precautions / Restrictions Precautions Precautions: Fall Restrictions Weight Bearing Restrictions: No      Mobility  Bed Mobility Overal bed mobility: Modified Independent                  Transfers Overall transfer level: Modified independent Equipment used: None                    Ambulation/Gait Ambulation/Gait assistance: Modified independent (Device/Increase time) Gait Distance (Feet): 100 Feet Assistive device: Rolling walker (2 wheels) Gait Pattern/deviations: Step-to pattern Gait velocity: very, very slow     General Gait Details: severe pain, reports feeling unsafe due to fears of collapse if pain spikes, can be safe feeling with RW  Stairs            Wheelchair Mobility    Modified Rankin (Stroke Patients Only)       Balance Overall balance assessment: Modified Independent, No apparent balance deficits (not formally assessed)                                           Pertinent Vitals/Pain Pain Assessment Pain Assessment: 0-10 Pain Score: 6  Pain Location: Rt lateral thoracic, rib area behind/under Rt breast Pain Descriptors / Indicators: Stabbing, Sharp Pain Intervention(s): Limited activity within patient's tolerance, Monitored during session,  Premedicated before session, Repositioned    Home Living Family/patient expects to be discharged to:: Private residence Living Arrangements: Alone Available Help at Discharge: Family (Son lives nearby) Type of Home: House Home Access: Stairs to enter Entrance Stairs-Rails: None;Right;Left Secretary/administrator of Steps: 2 and 4   Home Layout: One level Home Equipment: None      Prior Function Prior Level of Function : Independent/Modified Independent;Driving             Mobility Comments:  independent ADLs Comments: independent     Hand Dominance        Extremity/Trunk Assessment                Communication      Cognition                                                General Comments      Exercises     Assessment/Plan    PT Assessment Patient does not need any further PT services  PT Problem List         PT Treatment Interventions      PT Goals (Current goals can be found in the Care Plan section)  Acute Rehab PT Goals PT Goal Formulation: All assessment and education complete, DC therapy    Frequency       Co-evaluation               AM-PAC PT "6 Clicks" Mobility  Outcome Measure Help needed turning from your back to your side while in a flat bed without using bedrails?: None Help needed moving from lying on your back to sitting on the side of a flat bed without using bedrails?: None Help needed moving to and from a bed to a chair (including a wheelchair)?: None Help needed standing up from a chair using your arms (e.g., wheelchair or bedside chair)?: None Help needed to walk in hospital room?: None Help needed climbing 3-5 steps with a railing? : A Lot 6 Click Score: 22    End of Session   Activity Tolerance: Patient limited by pain Patient left: in chair;with nursing/sitter in room;with call bell/phone within reach Nurse Communication: Mobility status PT Visit Diagnosis: Difficulty in walking, not elsewhere classified (R26.2);History of falling (Z91.81)    Time: 4536-4680 PT Time Calculation (min) (ACUTE ONLY): 16 min   Charges:   PT Evaluation $PT Eval Low Complexity: 1 Low         2:04 PM, 04/12/22 Michelle Rios, PT, DPT Physical Therapist - [facility]  [phone] (ASCOM)   Michelle Rios C 04/12/2022, 1:59 PM

## 2022-04-12 NOTE — Progress Notes (Signed)
   04/12/22 1800  Clinical Encounter Type  Visited With Patient and family together  Visit Type Initial  Referral From Nurse  Consult/Referral To [facility] text   Chaplain responded to request to give Bible to patient.

## 2022-04-12 NOTE — Plan of Care (Signed)
  Problem: Respiratory: Goal: Ability to maintain adequate ventilation will improve Outcome: Progressing   Problem: Respiratory: Goal: Ability to maintain a clear airway will improve Outcome: Progressing   Problem: Clinical Measurements: Goal: Ability to maintain a body temperature in the normal range will improve Outcome: Progressing   Problem: Activity: Goal: Ability to tolerate increased activity will improve Outcome: Progressing   Problem: Pain Managment: Goal: General experience of comfort will improve Outcome: Progressing   Problem: Elimination: Goal: Will not experience complications related to urinary retention Outcome: Progressing

## 2022-04-12 NOTE — Progress Notes (Signed)
  Progress Note   Patient: Michelle Rios QPY:[id number] DOB: Oct 16, 1955 DOA: 04/10/2022     1 DOS: the patient was seen and examined on 04/12/2022    Assessment and Plan: * Right-sided chest pain, pleuritic Continue Solu-Medrol.  Incentive spirometer.  Nebulizer treatments.  As needed IV and oral medications.  Will obtain rib x-ray to rule out fracture.  CAP (community acquired pneumonia) Rocephin and Zithromax prescribed.  COVID and viral respiratory panel negative.  Acute respiratory failure with hypoxia (HCC) Pulse ox of 85% on room air.  Currently on 3 L saturating well.  Check pulse ox with ambulation to see if patient can come off oxygen.    Sarcoidosis of lung ([place]) Steroids should help.  AKI (acute kidney injury) ([place]) Creatinine went up to 1.41.  Improved with IV fluid bolus and IV fluids down to 0.89.  Discontinue fluids  Vitamin D deficiency Patient on vitamin D supplementation  Anemia Hemoglobin 10.  Hemoglobin did come down with IV fluids.  Add on a ferritin.  Obesity, Class III, BMI 40-49.9 (morbid obesity) (HCC) BMI 41.57  Essential hypertension Blood pressure variable with pain.        Subjective: Patient still with the same amount of pain as yesterday.  Breathing a little better.  Urinating okay.  Admitted with pneumonia and pleuritic pain.  Physical Exam: Vitals:   04/11/22 0816 04/11/22 1631 04/12/22 0006 04/12/22 0743  BP: 132/67 122/71 126/81 (!) 148/79  Pulse: 62 65 62 (!) 56  Resp: 15 16 (!) 8 18  Temp: (!) 97.4 F (36.3 C)  97.9 F (36.6 C) (!) 97.5 F (36.4 C)  TempSrc: Oral   Oral  SpO2: 100% 100% 100% 100%  Weight:      Height:       Physical Exam HENT:     Head: Normocephalic.     Mouth/Throat:     Pharynx: No oropharyngeal exudate.  Eyes:     General: Lids are normal.     Conjunctiva/sclera: Conjunctivae normal.  Cardiovascular:     Rate and Rhythm: Normal rate and regular rhythm.     Heart sounds: Normal heart sounds,  S1 normal and S2 normal.  Pulmonary:     Breath sounds: Examination of the right-lower field reveals decreased breath sounds. Examination of the left-lower field reveals decreased breath sounds. Decreased breath sounds present. No wheezing, rhonchi or rales.  Abdominal:     Palpations: Abdomen is soft.     Tenderness: There is no abdominal tenderness.  Musculoskeletal:     Right lower leg: No swelling.     Left lower leg: No swelling.  Skin:    General: Skin is warm.     Findings: No rash.  Neurological:     Mental Status: She is alert and oriented to person, place, and time.     Data Reviewed: Creatinine improved to 0.89.  Hemoglobin 10  Family Communication: Declined  Disposition: Status is: Inpatient Remains inpatient appropriate because: Still with quite a bit of pleuritic right-sided chest pain requiring IV and oral medications  Planned Discharge Destination: Home    Time spent: 27 minutes  Author: Loletha Grayer, MD 04/12/2022 2:04 PM  For on call review [email or web address].

## 2022-04-12 NOTE — TOC Progression Note (Signed)
Transition of Care [facility]) - Progression Note    Patient Details  Name: Michelle Rios MRN: [id number] Date of Birth: 02-03-1956  Transition of Care [facility]) CM/SW Media, RN Phone Number: 04/12/2022, 1:31 PM  Clinical Narrative:     TOC to continue to monitor for needs and assist with DC planning  Patient gets medication is [place] at both [facility] On 3 liters of O2 currently      Expected Discharge Plan and Services                                                 Social Determinants of Health (SDOH) Interventions    Readmission Risk Interventions     No data to display

## 2022-04-13 DIAGNOSIS — J9601 Acute respiratory failure with hypoxia: Secondary | ICD-10-CM | POA: Diagnosis not present

## 2022-04-13 DIAGNOSIS — R079 Chest pain, unspecified: Secondary | ICD-10-CM | POA: Diagnosis not present

## 2022-04-13 DIAGNOSIS — D638 Anemia in other chronic diseases classified elsewhere: Secondary | ICD-10-CM

## 2022-04-13 DIAGNOSIS — E041 Nontoxic single thyroid nodule: Secondary | ICD-10-CM

## 2022-04-13 DIAGNOSIS — D860 Sarcoidosis of lung: Secondary | ICD-10-CM | POA: Diagnosis not present

## 2022-04-13 DIAGNOSIS — J189 Pneumonia, unspecified organism: Secondary | ICD-10-CM | POA: Diagnosis not present

## 2022-04-13 DIAGNOSIS — Z23 Encounter for immunization: Secondary | ICD-10-CM | POA: Diagnosis not present

## 2022-04-13 LAB — ANA W/REFLEX IF POSITIVE: Anti Nuclear Antibody (ANA): NEGATIVE

## 2022-04-13 MED ORDER — ACETAMINOPHEN 325 MG PO TABS: 650.0000 mg | ORAL_TABLET | Freq: Four times a day (QID) | ORAL | Status: DC | PRN

## 2022-04-13 MED ORDER — HYDROCODONE-ACETAMINOPHEN 5-325 MG PO TABS: 1.0000 | ORAL_TABLET | Freq: Four times a day (QID) | ORAL | 0 refills | Status: DC | PRN

## 2022-04-13 MED ORDER — CEFDINIR 300 MG PO CAPS: 300.0000 mg | ORAL_CAPSULE | Freq: Two times a day (BID) | ORAL | 0 refills | Status: AC

## 2022-04-13 MED ORDER — AZITHROMYCIN 250 MG PO TABS: 500.0000 mg | ORAL_TABLET | Freq: Every day | ORAL | 0 refills | Status: DC

## 2022-04-13 MED ORDER — PREDNISONE 10 MG PO TABS: ORAL_TABLET | ORAL | 0 refills | Status: DC

## 2022-04-13 NOTE — Plan of Care (Signed)
Problem: Education: Goal: Knowledge of General Education information will improve Description: Including pain rating scale, medication(s)/side effects and non-pharmacologic comfort measures 04/13/2022 1326 by Ardelia Mems, RN Outcome: Adequate for Discharge 04/13/2022 1147 by Ardelia Mems, RN Outcome: Progressing   Problem: Health Behavior/Discharge Planning: Goal: Ability to manage health-related needs will improve 04/13/2022 1326 by Ardelia Mems, RN Outcome: Adequate for Discharge 04/13/2022 1147 by Ardelia Mems, RN Outcome: Progressing   Problem: Clinical Measurements: Goal: Ability to maintain clinical measurements within normal limits will improve 04/13/2022 1326 by Ardelia Mems, RN Outcome: Adequate for Discharge 04/13/2022 1147 by Ardelia Mems, RN Outcome: Progressing Goal: Will remain free from infection 04/13/2022 1326 by Ardelia Mems, RN Outcome: Adequate for Discharge 04/13/2022 1147 by Ardelia Mems, RN Outcome: Progressing Goal: Diagnostic test results will improve 04/13/2022 1326 by Ardelia Mems, RN Outcome: Adequate for Discharge 04/13/2022 1147 by Ardelia Mems, RN Outcome: Progressing Goal: Respiratory complications will improve 04/13/2022 1326 by Ardelia Mems, RN Outcome: Adequate for Discharge 04/13/2022 1147 by Ardelia Mems, RN Outcome: Progressing Goal: Cardiovascular complication will be avoided 04/13/2022 1326 by Ardelia Mems, RN Outcome: Adequate for Discharge 04/13/2022 1147 by Ardelia Mems, RN Outcome: Progressing   Problem: Activity: Goal: Risk for activity intolerance will decrease 04/13/2022 1326 by Ardelia Mems, RN Outcome: Adequate for Discharge 04/13/2022 1147 by Ardelia Mems, RN Outcome: Progressing   Problem: Nutrition: Goal: Adequate nutrition will be maintained 04/13/2022 1326 by Ardelia Mems, RN Outcome: Adequate for  Discharge 04/13/2022 1147 by Ardelia Mems, RN Outcome: Progressing   Problem: Coping: Goal: Level of anxiety will decrease 04/13/2022 1326 by Ardelia Mems, RN Outcome: Adequate for Discharge 04/13/2022 1147 by Ardelia Mems, RN Outcome: Progressing   Problem: Elimination: Goal: Will not experience complications related to bowel motility 04/13/2022 1326 by Ardelia Mems, RN Outcome: Adequate for Discharge 04/13/2022 1147 by Ardelia Mems, RN Outcome: Progressing Goal: Will not experience complications related to urinary retention 04/13/2022 1326 by Ardelia Mems, RN Outcome: Adequate for Discharge 04/13/2022 1147 by Ardelia Mems, RN Outcome: Progressing   Problem: Pain Managment: Goal: General experience of comfort will improve 04/13/2022 1326 by Ardelia Mems, RN Outcome: Adequate for Discharge 04/13/2022 1147 by Ardelia Mems, RN Outcome: Progressing   Problem: Safety: Goal: Ability to remain free from injury will improve 04/13/2022 1326 by Ardelia Mems, RN Outcome: Adequate for Discharge 04/13/2022 1147 by Ardelia Mems, RN Outcome: Progressing   Problem: Skin Integrity: Goal: Risk for impaired skin integrity will decrease 04/13/2022 1326 by Ardelia Mems, RN Outcome: Adequate for Discharge 04/13/2022 1147 by Ardelia Mems, RN Outcome: Progressing   Problem: Activity: Goal: Ability to tolerate increased activity will improve 04/13/2022 1326 by Ardelia Mems, RN Outcome: Adequate for Discharge 04/13/2022 1147 by Ardelia Mems, RN Outcome: Progressing   Problem: Clinical Measurements: Goal: Ability to maintain a body temperature in the normal range will improve 04/13/2022 1326 by Ardelia Mems, RN Outcome: Adequate for Discharge 04/13/2022 1147 by Ardelia Mems, RN Outcome: Progressing   Problem: Respiratory: Goal: Ability to maintain adequate ventilation will  improve 04/13/2022 1326 by Ardelia Mems, RN Outcome: Adequate for Discharge 04/13/2022 1147 by Ardelia Mems, RN Outcome: Progressing Goal: Ability to maintain a clear airway will improve 04/13/2022 1326 by Ardelia Mems, RN Outcome: Adequate for Discharge 04/13/2022 1147 by Ardelia Mems, RN Outcome: Progressing   Problem: Education: Goal: Knowledge of General Education information will improve Description: Including pain rating scale, medication(s)/side effects and non-pharmacologic comfort measures 04/13/2022 1326 by Ardelia Mems, RN Outcome: Adequate for Discharge 04/13/2022 1147 by Ardelia Mems, RN Outcome:  Progressing   Problem: Health Behavior/Discharge Planning: Goal: Ability to manage health-related needs will improve 04/13/2022 1326 by Berneta Sages, RN Outcome: Adequate for Discharge 04/13/2022 1147 by Berneta Sages, RN Outcome: Progressing   Problem: Clinical Measurements: Goal: Ability to maintain clinical measurements within normal limits will improve 04/13/2022 1326 by Berneta Sages, RN Outcome: Adequate for Discharge 04/13/2022 1147 by Berneta Sages, RN Outcome: Progressing Goal: Will remain free from infection 04/13/2022 1326 by Berneta Sages, RN Outcome: Adequate for Discharge 04/13/2022 1147 by Berneta Sages, RN Outcome: Progressing Goal: Diagnostic test results will improve 04/13/2022 1326 by Berneta Sages, RN Outcome: Adequate for Discharge 04/13/2022 1147 by Berneta Sages, RN Outcome: Progressing Goal: Respiratory complications will improve 04/13/2022 1326 by Berneta Sages, RN Outcome: Adequate for Discharge 04/13/2022 1147 by Berneta Sages, RN Outcome: Progressing Goal: Cardiovascular complication will be avoided 04/13/2022 1326 by Berneta Sages, RN Outcome: Adequate for Discharge 04/13/2022 1147 by Berneta Sages, RN Outcome: Progressing   Problem:  Activity: Goal: Risk for activity intolerance will decrease 04/13/2022 1326 by Berneta Sages, RN Outcome: Adequate for Discharge 04/13/2022 1147 by Berneta Sages, RN Outcome: Progressing   Problem: Nutrition: Goal: Adequate nutrition will be maintained 04/13/2022 1326 by Berneta Sages, RN Outcome: Adequate for Discharge 04/13/2022 1147 by Berneta Sages, RN Outcome: Progressing   Problem: Coping: Goal: Level of anxiety will decrease 04/13/2022 1326 by Berneta Sages, RN Outcome: Adequate for Discharge 04/13/2022 1147 by Berneta Sages, RN Outcome: Progressing   Problem: Elimination: Goal: Will not experience complications related to bowel motility 04/13/2022 1326 by Berneta Sages, RN Outcome: Adequate for Discharge 04/13/2022 1147 by Berneta Sages, RN Outcome: Progressing Goal: Will not experience complications related to urinary retention 04/13/2022 1326 by Berneta Sages, RN Outcome: Adequate for Discharge 04/13/2022 1147 by Berneta Sages, RN Outcome: Progressing   Problem: Pain Managment: Goal: General experience of comfort will improve 04/13/2022 1326 by Berneta Sages, RN Outcome: Adequate for Discharge 04/13/2022 1147 by Berneta Sages, RN Outcome: Progressing   Problem: Safety: Goal: Ability to remain free from injury will improve 04/13/2022 1326 by Berneta Sages, RN Outcome: Adequate for Discharge 04/13/2022 1147 by Berneta Sages, RN Outcome: Progressing   Problem: Skin Integrity: Goal: Risk for impaired skin integrity will decrease 04/13/2022 1326 by Berneta Sages, RN Outcome: Adequate for Discharge 04/13/2022 1147 by Berneta Sages, RN Outcome: Progressing   Problem: Activity: Goal: Ability to tolerate increased activity will improve 04/13/2022 1326 by Berneta Sages, RN Outcome: Adequate for Discharge 04/13/2022 1147 by Berneta Sages, RN Outcome: Progressing    Problem: Clinical Measurements: Goal: Ability to maintain a body temperature in the normal range will improve 04/13/2022 1326 by Berneta Sages, RN Outcome: Adequate for Discharge 04/13/2022 1147 by Berneta Sages, RN Outcome: Progressing   Problem: Respiratory: Goal: Ability to maintain adequate ventilation will improve 04/13/2022 1326 by Berneta Sages, RN Outcome: Adequate for Discharge 04/13/2022 1147 by Berneta Sages, RN Outcome: Progressing Goal: Ability to maintain a clear airway will improve 04/13/2022 1326 by Berneta Sages, RN Outcome: Adequate for Discharge 04/13/2022 1147 by Berneta Sages, RN Outcome: Progressing

## 2022-04-13 NOTE — Discharge Summary (Signed)
Physician Discharge Summary   Patient: Michelle JACQUEZ MRN: [id number] DOB: 1956-07-06  Admit date:     04/10/2022  Discharge date: 04/13/22  Discharge Physician: Loletha Grayer   PCP: Pcp, No   Recommendations at discharge:   Follow-up your medical doctor 5 days  Discharge Diagnoses: Principal Problem:   Right-sided chest pain, pleuritic Active Problems:   CAP (community acquired pneumonia)   Acute respiratory failure with hypoxia ([place])   Sarcoidosis of lung (HCC)   AKI (acute kidney injury) ([place])   Essential hypertension   Obesity, Class III, BMI 40-49.9 (morbid obesity) ([place])   Anemia of chronic disease   Vitamin D deficiency   Thyroid nodule    Hospital Course: The patient was admitted to the hospital on 04/11/2022 and discharged on 04/13/2022.  Patient was coming in with right-sided chest pain.  CT scan of the chest showed patchy groundglass opacities in the right upper lobe likely infectious/inflammatory in nature recommended CT scan in 2 months to reevaluate.  A subcentimeter thyroid nodule also seen.  Central mesenteric haziness with lymphadenopathy also seen.  The patient was started on Rocephin and Zithromax.  I added Solu-Medrol.  The patient was feeling better at the time of discharge with less pain.  She was discharged home on a prednisone taper Omnicef and Zithromax for 3 more days.  Staphylococcus epidermidis growing in 1 blood culture bottle is a contaminant the other 3 bottles are negative.  Assessment and Plan: * Right-sided chest pain, pleuritic Patient was given Solu-Medrol while here and will switch over to a prednisone taper  CAP (community acquired pneumonia) Rocephin and Zithromax prescribed.  COVID and viral respiratory panel negative.  Upon discharge Zithromax 250 mg daily for 3 more days and Omnicef twice daily for 3 more days prescribed.  Acute respiratory failure with hypoxia (HCC) Initial pulse ox of 85% on room air.  Patient with good pulse ox  on room air prior to disposition.  This issue has resolved.    Sarcoidosis of lung ([place]) Steroids should help.  AKI (acute kidney injury) ([place]) Creatinine went up to 1.41.  Improved with IV fluid bolus and IV fluids down to 0.89.    Thyroid nodule Can get a thyroid ultrasound as outpatient  Vitamin D deficiency Patient on vitamin D supplementation  Anemia of chronic disease Hemoglobin 10.  Ferritin of 128.  Obesity, Class III, BMI 40-49.9 (morbid obesity) (HCC) BMI 41.57  Essential hypertension Blood pressure variable with pain.         Consultants: None  Procedures performed: None Disposition: Home Diet recommendation:  Regular diet DISCHARGE MEDICATION: Allergies as of 04/13/2022       Reactions   Ivp Dye [iodinated Contrast Media] Anaphylaxis, Swelling   Voltaren [diclofenac Sodium] Other (See Comments)   Pt states had bad state in mouth, mouth and tongue turned black, and then pt vomited thick white mucous.        Medication List     TAKE these medications    acetaminophen 325 MG tablet Commonly known as: TYLENOL Take 2 tablets (650 mg total) by mouth every 6 (six) hours as needed for mild pain (or Fever >/= 101).   azithromycin 250 MG tablet Commonly known as: ZITHROMAX Take 2 tablets (500 mg total) by mouth at bedtime.   cefdinir 300 MG capsule Commonly known as: OMNICEF Take 1 capsule (300 mg total) by mouth 2 (two) times daily for 3 days.   HYDROcodone-acetaminophen 5-325 MG tablet Commonly known as: NORCO/VICODIN Take 1-2  tablets by mouth every 6 (six) hours as needed for severe pain.   predniSONE 10 MG tablet Commonly known as: DELTASONE 4 tabs po day1,2; 3 tabs po day3,4; 2 tabs po day5,6; 1 tab po day7,8   Vitamin D (Ergocalciferol) 1.25 MG (50000 UNIT) Caps capsule Commonly known as: DRISDOL Take 1 capsule (50,000 Units total) by mouth every 7 (seven) days.        Follow-up Information     your medical doctor Follow up in 5  day(s).                 Discharge Exam: Filed Weights   04/10/22 1928  Weight: 99.8 kg   Physical Exam HENT:     Head: Normocephalic.     Mouth/Throat:     Pharynx: No oropharyngeal exudate.  Eyes:     General: Lids are normal.     Conjunctiva/sclera: Conjunctivae normal.  Cardiovascular:     Rate and Rhythm: Normal rate and regular rhythm.     Heart sounds: Normal heart sounds, S1 normal and S2 normal.  Pulmonary:     Breath sounds: Examination of the right-lower field reveals decreased breath sounds. Examination of the left-lower field reveals decreased breath sounds. Decreased breath sounds present. No wheezing, rhonchi or rales.  Abdominal:     Palpations: Abdomen is soft.     Tenderness: There is no abdominal tenderness.  Musculoskeletal:     Right lower leg: No swelling.     Left lower leg: No swelling.  Skin:    General: Skin is warm.     Findings: No rash.  Neurological:     Mental Status: She is alert and oriented to person, place, and time.      Condition at discharge: stable  The results of significant diagnostics from this hospitalization (including imaging, microbiology, ancillary and laboratory) are listed below for reference.   Imaging Studies: DG Ribs Unilateral Right  Result Date: 04/12/2022 CLINICAL DATA:  Right lateral rib pain. EXAM: RIGHT RIBS - 2 VIEW COMPARISON:  Chest CT 2 days ago. FINDINGS: No fracture or other bone lesions are seen involving the ribs. Right glenohumeral osteoarthritis is partially assessed. IMPRESSION: Unremarkable radiographic appearance of the right ribs. Electronically Signed   By: Narda Rutherford M.D.   On: 04/12/2022 15:38   MR ANGIO CHEST W WO CONTRAST  Result Date: 04/11/2022 CLINICAL DATA:  Chest pain EXAM: MRA CHEST WITH AND WITHOUT CONTRAST TECHNIQUE: Angiographic images of the chest were obtained using MRA technique chest CT 04/10/2022 intravenous contrast. CONTRAST:  10mL GADAVIST GADOBUTROL 1 MMOL/ML IV  SOLN COMPARISON:  None Available. FINDINGS: VASCULAR Aorta: Normal course and caliber of the aorta.  No dissection. Heart: Heart size is normal. Pulmonary Arteries:  Normal pulmonary arteries. Other: None NON-VASCULAR Mediastinum: Unremarkable.  No lymphadenopathy. Lungs: Patchy areas hyperintense signal in the right parahilar region Bones: Unremarkable. IMPRESSION: 1. Normal MRA of the thoracic aorta. 2. Patchy areas of hyperintense signal in the right parahilar region, better characterized on earlier chest CT. Electronically Signed   By: Deatra Robinson M.D.   On: 04/11/2022 00:06   US ABDOMEN LIMITED RUQ (LIVER/GB)  Result Date: 04/10/2022 CLINICAL DATA:  [id number].  Right upper quadrant pain.  Obesity. EXAM: ULTRASOUND ABDOMEN LIMITED RIGHT UPPER QUADRANT COMPARISON:  None Available. FINDINGS: Gallbladder: No gallstones or wall thickening visualized. No sonographic Murphy sign noted by sonographer. Common bile duct: Diameter: 3 mm. Liver: No focal lesion identified. Increased parenchymal echogenicity. Portal vein is patent on color Doppler  imaging with normal direction of blood flow towards the liver. Other: None. IMPRESSION: 1. Hepatic steatosis. Please note limited evaluation for focal hepatic masses in a patient with hepatic steatosis due to decreased penetration of the acoustic ultrasound waves. 2. Limited evaluation of left hepatic lobe. Electronically Signed   By: Tish Frederickson M.D.   On: 04/10/2022 23:20   US Venous Img Lower Bilateral  Result Date: 04/10/2022 CLINICAL DATA:  Leg swelling. EXAM: BILATERAL LOWER EXTREMITY VENOUS DOPPLER ULTRASOUND TECHNIQUE: Gray-scale sonography with graded compression, as well as color Doppler and duplex ultrasound were performed to evaluate the lower extremity deep venous systems from the level of the common femoral vein and including the common femoral, femoral, profunda femoral, popliteal and calf veins including the posterior tibial, peroneal and gastrocnemius  veins when visible. The superficial great saphenous vein was also interrogated. Spectral Doppler was utilized to evaluate flow at rest and with distal augmentation maneuvers in the common femoral, femoral and popliteal veins. COMPARISON:  None Available. FINDINGS: RIGHT LOWER EXTREMITY Common Femoral Vein: No evidence of thrombus. Normal compressibility, respiratory phasicity and response to augmentation. Saphenofemoral Junction: No evidence of thrombus. Normal compressibility and flow on color Doppler imaging. Profunda Femoral Vein: No evidence of thrombus. Normal compressibility and flow on color Doppler imaging. Femoral Vein: No evidence of thrombus. Normal compressibility, respiratory phasicity and response to augmentation. Popliteal Vein: No evidence of thrombus. Normal compressibility, respiratory phasicity and response to augmentation. Calf Veins: No evidence of thrombus. Normal compressibility and flow on color Doppler imaging. Superficial Great Saphenous Vein: No evidence of thrombus. Normal compressibility. Venous Reflux:  None. Other Findings:  None. LEFT LOWER EXTREMITY Common Femoral Vein: No evidence of thrombus. Normal compressibility, respiratory phasicity and response to augmentation. Saphenofemoral Junction: No evidence of thrombus. Normal compressibility and flow on color Doppler imaging. Profunda Femoral Vein: No evidence of thrombus. Normal compressibility and flow on color Doppler imaging. Femoral Vein: No evidence of thrombus. Normal compressibility, respiratory phasicity and response to augmentation. Popliteal Vein: No evidence of thrombus. Normal compressibility, respiratory phasicity and response to augmentation. Calf Veins: No evidence of thrombus. Normal compressibility and flow on color Doppler imaging. Superficial Great Saphenous Vein: No evidence of thrombus. Normal compressibility. Venous Reflux:  None. Other Findings:  None. IMPRESSION: No evidence of deep venous thrombosis in either  lower extremity. Electronically Signed   By: Darliss Cheney M.D.   On: 04/10/2022 23:08   CT CHEST ABDOMEN PELVIS WO CONTRAST  Result Date: 04/10/2022 CLINICAL DATA:  Right-sided chest pain. EXAM: CT CHEST, ABDOMEN AND PELVIS WITHOUT CONTRAST TECHNIQUE: Multidetector CT imaging of the chest, abdomen and pelvis was performed following the standard protocol without IV contrast. RADIATION DOSE REDUCTION: This exam was performed according to the departmental dose-optimization program which includes automated exposure control, adjustment of the mA and/or kV according to patient size and/or use of iterative reconstruction technique. COMPARISON:  CT abdomen and pelvis 02/20/2018 FINDINGS: CT CHEST FINDINGS Cardiovascular: Heart is borderline enlarged. Aorta is normal in size. There are atherosclerotic calcifications of the aorta. Mediastinum/Nodes: There is a 6 mm hypodense left thyroid nodule. No enlarged mediastinal lymph nodes are seen. Difficult to assess for hilar adenopathy secondary to lack of contrast. Esophagus is within normal limits. Lungs/Pleura: There are patchy ground-glass opacities throughout the right upper lobe. Mild emphysematous changes are seen. The lungs are otherwise clear. No pleural effusion or pneumothorax identified. Musculoskeletal: There is DISH of the thoracic spine. CT ABDOMEN PELVIS FINDINGS Hepatobiliary: No focal liver abnormality is seen. No  gallstones, gallbladder wall thickening, or biliary dilatation. Pancreas: Unremarkable. No pancreatic ductal dilatation or surrounding inflammatory changes. Spleen: Normal in size without focal abnormality. Adrenals/Urinary Tract: Adrenal glands are unremarkable. Kidneys are normal, without renal calculi, focal lesion, or hydronephrosis. Bladder is unremarkable. Stomach/Bowel: Stomach is within normal limits. Appendix appears normal. No evidence of bowel wall thickening, distention, or inflammatory changes. There is sigmoid and descending colon  diverticulosis. Vascular/Lymphatic: Aorta and IVC are normal in size. There are atherosclerotic calcifications of the aorta. There are nonenlarged and mildly enlarged central mesenteric lymph nodes measuring up to 11 mm with some central mesenteric haziness. This is a new finding. Reproductive: Status post hysterectomy. No adnexal masses. Other: No abdominal wall hernia or abnormality. No abdominopelvic ascites. Musculoskeletal: No acute or significant osseous findings. IMPRESSION: 1. Patchy ground-glass opacities in the right upper lobe, likely infectious/inflammatory. Follow-up chest CT recommended in 2 months to re-evaluate. 2. Subcentimeter incidental left thyroid nodule. No follow-up imaging is recommended. Reference: J Am Coll Radiol. 2015 Feb;12(2): 143-50 3. Central mesenteric haziness with lymphadenopathy may be related to infection/inflammation. Other etiologies (neoplastic) can not be excluded in the appropriate clinical setting. 4. Colonic diverticulosis. Electronically Signed   By: Darliss Cheney M.D.   On: 04/10/2022 21:22   DG Chest Portable 1 View  Result Date: 04/10/2022 CLINICAL DATA:  Chest pain. EXAM: PORTABLE CHEST 1 VIEW COMPARISON:  Chest x-ray 09/29/2018 FINDINGS: The heart size and mediastinal contours are within normal limits. Both lungs are clear. The visualized skeletal structures are unremarkable. IMPRESSION: No active disease. Electronically Signed   By: Darliss Cheney M.D.   On: 04/10/2022 19:44    Microbiology: Results for orders placed or performed during the hospital encounter of 04/10/22  SARS Coronavirus 2 by RT PCR (hospital order, performed in [facility] hospital lab) *cepheid single result test* Anterior Nasal Swab     Status: None   Collection Time: 04/10/22  7:27 PM   Specimen: Anterior Nasal Swab  Result Value Ref Range Status   SARS Coronavirus 2 by RT PCR NEGATIVE NEGATIVE Final    Comment: (NOTE) SARS-CoV-2 target nucleic acids are NOT DETECTED.  The  SARS-CoV-2 RNA is generally detectable in upper and lower respiratory specimens during the acute phase of infection. The lowest concentration of SARS-CoV-2 viral copies this assay can detect is 250 copies / mL. A negative result does not preclude SARS-CoV-2 infection and should not be used as the sole basis for treatment or other patient management decisions.  A negative result may occur with improper specimen collection / handling, submission of specimen other than nasopharyngeal swab, presence of viral mutation(s) within the areas targeted by this assay, and inadequate number of viral copies (<250 copies / mL). A negative result must be combined with clinical observations, patient history, and epidemiological information.  Fact Sheet for Patients:   RoadLapTop.co.za  Fact Sheet for Healthcare Providers: http://kim-miller.com/  This test is not yet approved or  cleared by the [place] FDA and has been authorized for detection and/or diagnosis of SARS-CoV-2 by FDA under an Emergency Use Authorization (EUA).  This EUA will remain in effect (meaning this test can be used) for the duration of the COVID-19 declaration under Section 564(b)(1) of the Act, 21 U.S.C. section 360bbb-3(b)(1), unless the authorization is terminated or revoked sooner.  Performed at [facility], [address] [place], [place] [address]   Blood culture (routine x 2)     Status: None (Preliminary result)   Collection Time: 04/11/22 12:25  AM   Specimen: BLOOD  Result Value Ref Range Status   Specimen Description BLOOD BLOOD RIGHT ARM  Final   Special Requests   Final    BOTTLES DRAWN AEROBIC AND ANAEROBIC Blood Culture adequate volume   Culture   Final    NO GROWTH 2 DAYS Performed at [facility], [address] [place], [place] [address]    Report Status PENDING  Incomplete  Blood culture (routine x 2)     Status: Abnormal  (Preliminary result)   Collection Time: 04/11/22 12:25 AM   Specimen: BLOOD  Result Value Ref Range Status   Specimen Description   Final    BLOOD BLOOD RIGHT ARM Performed at [facility], [address] [place], [place] [address]    Special Requests   Final    BOTTLES DRAWN AEROBIC AND ANAEROBIC Blood Culture results may not be optimal due to an inadequate volume of blood received in culture bottles Performed at [facility], [address], [place], [place] [address]    Culture  Setup Time   Final    GRAM POSITIVE COCCI AEROBIC BOTTLE ONLY Organism ID to follow CRITICAL RESULT CALLED TO, READ BACK BY AND VERIFIED WITH: JASON ROBBINS 04/11/22 1034 KLW    Culture (A)  Final    STAPHYLOCOCCUS EPIDERMIDIS THE SIGNIFICANCE OF ISOLATING THIS ORGANISM FROM A SINGLE SET OF BLOOD CULTURES WHEN MULTIPLE SETS ARE DRAWN IS UNCERTAIN. PLEASE NOTIFY THE MICROBIOLOGY DEPARTMENT WITHIN ONE WEEK IF SPECIATION AND SENSITIVITIES ARE REQUIRED. Performed at [facility], [address], [place], [place] [address]    Report Status PENDING  Incomplete  Blood Culture ID Panel (Reflexed)     Status: Abnormal   Collection Time: 04/11/22 12:25 AM  Result Value Ref Range Status   Enterococcus faecalis NOT DETECTED NOT DETECTED Final   Enterococcus Faecium NOT DETECTED NOT DETECTED Final   Listeria monocytogenes NOT DETECTED NOT DETECTED Final   Staphylococcus species DETECTED (A) NOT DETECTED Final    Comment: CRITICAL RESULT CALLED TO, READ BACK BY AND VERIFIED WITH: JASON ROBBINS 04/11/22 1034 KLW    Staphylococcus aureus (BCID) NOT DETECTED NOT DETECTED Final   Staphylococcus epidermidis DETECTED (A) NOT DETECTED Final    Comment: CRITICAL RESULT CALLED TO, READ BACK BY AND VERIFIED WITH: JASON ROBBINS 04/11/22 1034 KLW    Staphylococcus lugdunensis NOT DETECTED NOT DETECTED Final   Streptococcus species NOT DETECTED NOT DETECTED Final   Streptococcus agalactiae NOT  DETECTED NOT DETECTED Final   Streptococcus pneumoniae NOT DETECTED NOT DETECTED Final   Streptococcus pyogenes NOT DETECTED NOT DETECTED Final   A.calcoaceticus-baumannii NOT DETECTED NOT DETECTED Final   Bacteroides fragilis NOT DETECTED NOT DETECTED Final   Enterobacterales NOT DETECTED NOT DETECTED Final   Enterobacter cloacae complex NOT DETECTED NOT DETECTED Final   Escherichia coli NOT DETECTED NOT DETECTED Final   Klebsiella aerogenes NOT DETECTED NOT DETECTED Final   Klebsiella oxytoca NOT DETECTED NOT DETECTED Final   Klebsiella pneumoniae NOT DETECTED NOT DETECTED Final   Proteus species NOT DETECTED NOT DETECTED Final   Salmonella species NOT DETECTED NOT DETECTED Final   Serratia marcescens NOT DETECTED NOT DETECTED Final   Haemophilus influenzae NOT DETECTED NOT DETECTED Final   Neisseria meningitidis NOT DETECTED NOT DETECTED Final   Pseudomonas aeruginosa NOT DETECTED NOT DETECTED Final   Stenotrophomonas maltophilia NOT DETECTED NOT DETECTED Final   Candida albicans NOT DETECTED NOT DETECTED Final   Candida auris NOT DETECTED NOT DETECTED Final  Candida glabrata NOT DETECTED NOT DETECTED Final   Candida krusei NOT DETECTED NOT DETECTED Final   Candida parapsilosis NOT DETECTED NOT DETECTED Final   Candida tropicalis NOT DETECTED NOT DETECTED Final   Cryptococcus neoformans/gattii NOT DETECTED NOT DETECTED Final   Methicillin resistance mecA/C NOT DETECTED NOT DETECTED Final    Comment: Performed at [facility], [address] [place], [place] [address]  Respiratory (~20 pathogens) panel by PCR     Status: None   Collection Time: 04/11/22  3:09 AM   Specimen: Nasopharyngeal Swab; Respiratory  Result Value Ref Range Status   Adenovirus NOT DETECTED NOT DETECTED Final   Coronavirus 229E NOT DETECTED NOT DETECTED Final    Comment: (NOTE) The Coronavirus on the Respiratory Panel, DOES NOT test for the novel  Coronavirus (2019 nCoV)    Coronavirus HKU1 NOT  DETECTED NOT DETECTED Final   Coronavirus NL63 NOT DETECTED NOT DETECTED Final   Coronavirus OC43 NOT DETECTED NOT DETECTED Final   Metapneumovirus NOT DETECTED NOT DETECTED Final   Rhinovirus / Enterovirus NOT DETECTED NOT DETECTED Final   Influenza A NOT DETECTED NOT DETECTED Final   Influenza B NOT DETECTED NOT DETECTED Final   Parainfluenza Virus 1 NOT DETECTED NOT DETECTED Final   Parainfluenza Virus 2 NOT DETECTED NOT DETECTED Final   Parainfluenza Virus 3 NOT DETECTED NOT DETECTED Final   Parainfluenza Virus 4 NOT DETECTED NOT DETECTED Final   Respiratory Syncytial Virus NOT DETECTED NOT DETECTED Final   Bordetella pertussis NOT DETECTED NOT DETECTED Final   Bordetella Parapertussis NOT DETECTED NOT DETECTED Final   Chlamydophila pneumoniae NOT DETECTED NOT DETECTED Final   Mycoplasma pneumoniae NOT DETECTED NOT DETECTED Final    Comment: Performed at [facility], [address], [place], [place] [address]    Labs: CBC: Recent Labs  Lab 04/10/22 1927 04/11/22 0309 04/12/22 0616  WBC 5.9 5.5 5.9  NEUTROABS 3.7  --   --   HGB 11.4* 11.6* 10.0*  HCT 35.1* 36.0 31.4*  MCV 99.2 102.0* 102.6*  PLT 344 325 304   Basic Metabolic Panel: Recent Labs  Lab 04/10/22 1927 04/11/22 0309 04/12/22 0616  NA 141  --  138  K 3.8  --  4.4  CL 103  --  105  CO2 27  --  27  GLUCOSE 91  --  100*  BUN 14  --  24*  CREATININE 0.92 1.41* 0.89  CALCIUM 9.3  --  8.8*   Liver Function Tests: Recent Labs  Lab 04/10/22 1927  AST 20  ALT 13  ALKPHOS 81  BILITOT 0.7  PROT 8.0  ALBUMIN 4.0    Discharge time spent: greater than 30 minutes.  Signed: Alford Highland, MD [facility] 04/13/2022

## 2022-04-13 NOTE — Plan of Care (Signed)
  Problem: Education: Goal: Knowledge of General Education information will improve Description: Including pain rating scale, medication(s)/side effects and non-pharmacologic comfort measures Outcome: Progressing   Problem: Health Behavior/Discharge Planning: Goal: Ability to manage health-related needs will improve Outcome: Progressing   Problem: Clinical Measurements: Goal: Ability to maintain clinical measurements within normal limits will improve Outcome: Progressing Goal: Will remain free from infection Outcome: Progressing Goal: Diagnostic test results will improve Outcome: Progressing Goal: Respiratory complications will improve Outcome: Progressing Goal: Cardiovascular complication will be avoided Outcome: Progressing   Problem: Activity: Goal: Risk for activity intolerance will decrease Outcome: Progressing   Problem: Nutrition: Goal: Adequate nutrition will be maintained Outcome: Progressing   Problem: Coping: Goal: Level of anxiety will decrease Outcome: Progressing   Problem: Elimination: Goal: Will not experience complications related to bowel motility Outcome: Progressing Goal: Will not experience complications related to urinary retention Outcome: Progressing   Problem: Pain Managment: Goal: General experience of comfort will improve Outcome: Progressing   Problem: Safety: Goal: Ability to remain free from injury will improve Outcome: Progressing   Problem: Skin Integrity: Goal: Risk for impaired skin integrity will decrease Outcome: Progressing   Problem: Activity: Goal: Ability to tolerate increased activity will improve Outcome: Progressing   Problem: Clinical Measurements: Goal: Ability to maintain a body temperature in the normal range will improve Outcome: Progressing   Problem: Respiratory: Goal: Ability to maintain adequate ventilation will improve Outcome: Progressing Goal: Ability to maintain a clear airway will improve Outcome:  Progressing   Problem: Education: Goal: Knowledge of General Education information will improve Description: Including pain rating scale, medication(s)/side effects and non-pharmacologic comfort measures Outcome: Progressing   Problem: Health Behavior/Discharge Planning: Goal: Ability to manage health-related needs will improve Outcome: Progressing   Problem: Clinical Measurements: Goal: Ability to maintain clinical measurements within normal limits will improve Outcome: Progressing Goal: Will remain free from infection Outcome: Progressing Goal: Diagnostic test results will improve Outcome: Progressing Goal: Respiratory complications will improve Outcome: Progressing Goal: Cardiovascular complication will be avoided Outcome: Progressing   Problem: Activity: Goal: Risk for activity intolerance will decrease Outcome: Progressing   Problem: Nutrition: Goal: Adequate nutrition will be maintained Outcome: Progressing   Problem: Coping: Goal: Level of anxiety will decrease Outcome: Progressing   Problem: Elimination: Goal: Will not experience complications related to bowel motility Outcome: Progressing Goal: Will not experience complications related to urinary retention Outcome: Progressing   Problem: Pain Managment: Goal: General experience of comfort will improve Outcome: Progressing   Problem: Safety: Goal: Ability to remain free from injury will improve Outcome: Progressing   Problem: Skin Integrity: Goal: Risk for impaired skin integrity will decrease Outcome: Progressing   Problem: Activity: Goal: Ability to tolerate increased activity will improve Outcome: Progressing   Problem: Clinical Measurements: Goal: Ability to maintain a body temperature in the normal range will improve Outcome: Progressing   Problem: Respiratory: Goal: Ability to maintain adequate ventilation will improve Outcome: Progressing Goal: Ability to maintain a clear airway will  improve Outcome: Progressing   

## 2022-04-13 NOTE — Assessment & Plan Note (Signed)
Can get a thyroid ultrasound as outpatient

## 2022-04-13 NOTE — Progress Notes (Signed)
Patient ambulated from bed to nurse's station at a baseline SpO2 of 96%, lowest SpO2 being 95%. Patient demonstrated no pain or difficulties w/ pleuritic pain.

## 2022-04-13 NOTE — Plan of Care (Signed)
  Problem: Education: Goal: Knowledge of General Education information will improve Description: Including pain rating scale, medication(s)/side effects and non-pharmacologic comfort measures Outcome: Progressing   Problem: Health Behavior/Discharge Planning: Goal: Ability to manage health-related needs will improve Outcome: Progressing   Problem: Clinical Measurements: Goal: Ability to maintain clinical measurements within normal limits will improve Outcome: Progressing Goal: Will remain free from infection Outcome: Progressing Goal: Diagnostic test results will improve Outcome: Progressing Goal: Respiratory complications will improve Outcome: Progressing Goal: Cardiovascular complication will be avoided Outcome: Progressing   Problem: Activity: Goal: Risk for activity intolerance will decrease Outcome: Progressing   Problem: Nutrition: Goal: Adequate nutrition will be maintained Outcome: Progressing   Problem: Coping: Goal: Level of anxiety will decrease Outcome: Progressing   Problem: Elimination: Goal: Will not experience complications related to bowel motility Outcome: Progressing Goal: Will not experience complications related to urinary retention Outcome: Progressing   Problem: Pain Managment: Goal: General experience of comfort will improve Outcome: Progressing   Problem: Safety: Goal: Ability to remain free from injury will improve Outcome: Progressing   Problem: Skin Integrity: Goal: Risk for impaired skin integrity will decrease Outcome: Progressing   Problem: Activity: Goal: Ability to tolerate increased activity will improve Outcome: Progressing   Problem: Clinical Measurements: Goal: Ability to maintain a body temperature in the normal range will improve Outcome: Progressing   Problem: Respiratory: Goal: Ability to maintain adequate ventilation will improve Outcome: Progressing Goal: Ability to maintain a clear airway will improve Outcome:  Progressing   Problem: Education: Goal: Knowledge of General Education information will improve Description: Including pain rating scale, medication(s)/side effects and non-pharmacologic comfort measures Outcome: Progressing   Problem: Health Behavior/Discharge Planning: Goal: Ability to manage health-related needs will improve Outcome: Progressing   Problem: Clinical Measurements: Goal: Ability to maintain clinical measurements within normal limits will improve Outcome: Progressing Goal: Will remain free from infection Outcome: Progressing Goal: Diagnostic test results will improve Outcome: Progressing Goal: Respiratory complications will improve Outcome: Progressing Goal: Cardiovascular complication will be avoided Outcome: Progressing   Problem: Activity: Goal: Risk for activity intolerance will decrease Outcome: Progressing   Problem: Nutrition: Goal: Adequate nutrition will be maintained Outcome: Progressing   Problem: Coping: Goal: Level of anxiety will decrease Outcome: Progressing   Problem: Elimination: Goal: Will not experience complications related to bowel motility Outcome: Progressing Goal: Will not experience complications related to urinary retention Outcome: Progressing   Problem: Pain Managment: Goal: General experience of comfort will improve Outcome: Progressing   Problem: Safety: Goal: Ability to remain free from injury will improve Outcome: Progressing   Problem: Skin Integrity: Goal: Risk for impaired skin integrity will decrease Outcome: Progressing   Problem: Activity: Goal: Ability to tolerate increased activity will improve Outcome: Progressing   Problem: Clinical Measurements: Goal: Ability to maintain a body temperature in the normal range will improve Outcome: Progressing   Problem: Respiratory: Goal: Ability to maintain adequate ventilation will improve Outcome: Progressing Goal: Ability to maintain a clear airway will  improve Outcome: Progressing

## 2022-04-16 LAB — CULTURE, BLOOD (ROUTINE X 2)
Culture: NO GROWTH
Special Requests: ADEQUATE

## 2022-04-17 LAB — CULTURE, BLOOD (ROUTINE X 2)

## 2022-04-19 ENCOUNTER — Encounter (HOSPITAL_COMMUNITY): Payer: Self-pay | Admitting: Emergency Medicine

## 2022-04-19 ENCOUNTER — Other Ambulatory Visit: Payer: Self-pay

## 2022-04-19 ENCOUNTER — Observation Stay (HOSPITAL_COMMUNITY)
Admission: EM | Admit: 2022-04-19 | Discharge: 2022-04-21 | Disposition: A | Discharge status: Home / Self Care | Payer: Medicare HMO | Attending: Internal Medicine | Admitting: Internal Medicine

## 2022-04-19 ENCOUNTER — Ambulatory Visit (INDEPENDENT_AMBULATORY_CARE_PROVIDER_SITE_OTHER): Payer: Medicare HMO

## 2022-04-19 ENCOUNTER — Ambulatory Visit (HOSPITAL_COMMUNITY)
Admission: EM | Admit: 2022-04-19 | Discharge: 2022-04-19 | Disposition: A | Discharge status: Home / Self Care | Payer: Medicare HMO | Attending: Family Medicine | Admitting: Family Medicine

## 2022-04-19 ENCOUNTER — Emergency Department (HOSPITAL_COMMUNITY): Payer: Medicare HMO

## 2022-04-19 DIAGNOSIS — I10 Essential (primary) hypertension: Secondary | ICD-10-CM | POA: Diagnosis not present

## 2022-04-19 DIAGNOSIS — J45909 Unspecified asthma, uncomplicated: Secondary | ICD-10-CM | POA: Insufficient documentation

## 2022-04-19 DIAGNOSIS — R7401 Elevation of levels of liver transaminase levels: Secondary | ICD-10-CM | POA: Diagnosis not present

## 2022-04-19 DIAGNOSIS — Z79899 Other long term (current) drug therapy: Secondary | ICD-10-CM | POA: Insufficient documentation

## 2022-04-19 DIAGNOSIS — J90 Pleural effusion, not elsewhere classified: Secondary | ICD-10-CM

## 2022-04-19 DIAGNOSIS — R591 Generalized enlarged lymph nodes: Secondary | ICD-10-CM | POA: Insufficient documentation

## 2022-04-19 DIAGNOSIS — I700 Atherosclerosis of aorta: Secondary | ICD-10-CM | POA: Diagnosis not present

## 2022-04-19 DIAGNOSIS — R0609 Other forms of dyspnea: Secondary | ICD-10-CM | POA: Diagnosis not present

## 2022-04-19 DIAGNOSIS — R109 Unspecified abdominal pain: Secondary | ICD-10-CM | POA: Diagnosis not present

## 2022-04-19 DIAGNOSIS — R748 Abnormal levels of other serum enzymes: Secondary | ICD-10-CM | POA: Diagnosis present

## 2022-04-19 DIAGNOSIS — M47814 Spondylosis without myelopathy or radiculopathy, thoracic region: Secondary | ICD-10-CM | POA: Diagnosis not present

## 2022-04-19 DIAGNOSIS — Z86718 Personal history of other venous thrombosis and embolism: Secondary | ICD-10-CM | POA: Insufficient documentation

## 2022-04-19 DIAGNOSIS — R10A1 Flank pain, right side: Secondary | ICD-10-CM

## 2022-04-19 DIAGNOSIS — I878 Other specified disorders of veins: Secondary | ICD-10-CM | POA: Diagnosis not present

## 2022-04-19 DIAGNOSIS — Z86711 Personal history of pulmonary embolism: Secondary | ICD-10-CM | POA: Diagnosis not present

## 2022-04-19 DIAGNOSIS — R0600 Dyspnea, unspecified: Secondary | ICD-10-CM | POA: Diagnosis not present

## 2022-04-19 DIAGNOSIS — R0781 Pleurodynia: Secondary | ICD-10-CM

## 2022-04-19 DIAGNOSIS — J439 Emphysema, unspecified: Secondary | ICD-10-CM | POA: Diagnosis not present

## 2022-04-19 DIAGNOSIS — R0789 Other chest pain: Secondary | ICD-10-CM | POA: Diagnosis not present

## 2022-04-19 DIAGNOSIS — R079 Chest pain, unspecified: Secondary | ICD-10-CM

## 2022-04-19 DIAGNOSIS — R0602 Shortness of breath: Secondary | ICD-10-CM | POA: Diagnosis not present

## 2022-04-19 DIAGNOSIS — R918 Other nonspecific abnormal finding of lung field: Secondary | ICD-10-CM | POA: Diagnosis not present

## 2022-04-19 DIAGNOSIS — D539 Nutritional anemia, unspecified: Secondary | ICD-10-CM | POA: Diagnosis not present

## 2022-04-19 DIAGNOSIS — K8020 Calculus of gallbladder without cholecystitis without obstruction: Secondary | ICD-10-CM | POA: Diagnosis not present

## 2022-04-19 DIAGNOSIS — D869 Sarcoidosis, unspecified: Secondary | ICD-10-CM

## 2022-04-19 DIAGNOSIS — Z862 Personal history of diseases of the blood and blood-forming organs and certain disorders involving the immune mechanism: Secondary | ICD-10-CM

## 2022-04-19 LAB — CBC WITH DIFFERENTIAL/PLATELET
Abs Immature Granulocytes: 0.04 10*3/uL (ref 0.00–0.07)
Basophils Absolute: 0 10*3/uL (ref 0.0–0.1)
Basophils Relative: 0 %
Eosinophils Absolute: 0.1 10*3/uL (ref 0.0–0.5)
Eosinophils Relative: 1 %
HCT: 33.5 % — ABNORMAL LOW (ref 36.0–46.0)
Hemoglobin: 10.9 g/dL — ABNORMAL LOW (ref 12.0–15.0)
Immature Granulocytes: 0 %
Lymphocytes Relative: 27 %
Lymphs Abs: 2.7 10*3/uL (ref 0.7–4.0)
MCH: 33 pg (ref 26.0–34.0)
MCHC: 32.5 g/dL (ref 30.0–36.0)
MCV: 101.5 fL — ABNORMAL HIGH (ref 80.0–100.0)
Monocytes Absolute: 0.8 10*3/uL (ref 0.1–1.0)
Monocytes Relative: 8 %
Neutro Abs: 6.4 10*3/uL (ref 1.7–7.7)
Neutrophils Relative %: 64 %
Platelets: 358 10*3/uL (ref 150–400)
RBC: 3.3 MIL/uL — ABNORMAL LOW (ref 3.87–5.11)
RDW: 11.9 % (ref 11.5–15.5)
WBC: 10.1 10*3/uL (ref 4.0–10.5)
nRBC: 0 % (ref 0.0–0.2)

## 2022-04-19 LAB — URINALYSIS, ROUTINE W REFLEX MICROSCOPIC
Bacteria, UA: NONE SEEN
Bilirubin Urine: NEGATIVE
Glucose, UA: NEGATIVE mg/dL
Hgb urine dipstick: NEGATIVE
Ketones, ur: NEGATIVE mg/dL
Leukocytes,Ua: NEGATIVE
Nitrite: NEGATIVE
Protein, ur: NEGATIVE mg/dL
Specific Gravity, Urine: 1.015 (ref 1.005–1.030)
pH: 8.5 — ABNORMAL HIGH (ref 5.0–8.0)

## 2022-04-19 LAB — COMPREHENSIVE METABOLIC PANEL
ALT: 58 U/L — ABNORMAL HIGH (ref 0–44)
AST: 52 U/L — ABNORMAL HIGH (ref 15–41)
Albumin: 3.8 g/dL (ref 3.5–5.0)
Alkaline Phosphatase: 83 U/L (ref 38–126)
Anion gap: 12 (ref 5–15)
BUN: 10 mg/dL (ref 8–23)
CO2: 26 mmol/L (ref 22–32)
Calcium: 9.5 mg/dL (ref 8.9–10.3)
Chloride: 102 mmol/L (ref 98–111)
Creatinine, Ser: 0.79 mg/dL (ref 0.44–1.00)
GFR, Estimated: >60 mL/min (ref >60)
Glucose, Bld: 121 mg/dL — ABNORMAL HIGH (ref 70–99)
Potassium: 3.9 mmol/L (ref 3.5–5.1)
Sodium: 140 mmol/L (ref 135–145)
Total Bilirubin: 0.9 mg/dL (ref 0.3–1.2)
Total Protein: 7.4 g/dL (ref 6.5–8.1)

## 2022-04-19 LAB — LIPASE, BLOOD: Lipase: 23 U/L (ref 11–51)

## 2022-04-19 LAB — TROPONIN I (HIGH SENSITIVITY): Troponin I (High Sensitivity): 5 ng/L (ref <18)

## 2022-04-19 MED ORDER — HYDROCODONE-ACETAMINOPHEN 5-325 MG PO TABS: 1.0000 | ORAL_TABLET | Freq: Four times a day (QID) | ORAL | 0 refills | Status: DC | PRN

## 2022-04-19 NOTE — ED Triage Notes (Signed)
Pt reports right sided pain at her lung area since 04/10/2022; pain radiates from right rib area around upper abdomen, denies fevers, N/v/d, and urinary s/s; pt reports she was dx with pna

## 2022-04-19 NOTE — ED Triage Notes (Signed)
Pt reports was in hospital last week for PNA. Reports having pain on right side and out of oxycodone that was given. Also reports having sarcoidosis. Reports prednisone is not helping her symptoms.

## 2022-04-19 NOTE — ED Provider Triage Note (Signed)
Emergency Medicine Provider Triage Evaluation Note  Michelle Rios , a 66 y.o. female  was evaluated in triage.  Pt complains of right chest and flank pain.  Has been going on over the last week and a half.  Was admitted on the 17th for similar.  Was told she had pneumonia.  Due to location of pain she had MRI given anaphylaxis to CT contrast dye which did not show any evidence of dissection.  States she continues to have pain.  Located to the right side of her chest and into her flank.  No dysuria or hematuria.   Review of Systems  Positive: Chest pain, flank pain Negative: Fever, emesis  Physical Exam  BP (!) 177/110   Pulse 100   Temp 98.6 F (37 C)   Resp 17   SpO2 98%  Gen:   Awake, no distress   Resp:  Normal effort  MSK:   Moves extremities without difficulty Other:    Medical Decision Making  Medically screening exam initiated at 8:12 PM.  Appropriate orders placed.  [name] was informed that the remainder of the evaluation will be completed by another provider, this initial triage assessment does not replace that evaluation, and the importance of remaining in the ED until their evaluation is complete.  Chest pain, flank pain   Severe anaphylaxis to contrast dye.  I reviewed her MRI from [place] during her prior admission did not show dissection.  Will start with non con CT   [name], PA-C 04/19/22 2014

## 2022-04-19 NOTE — Discharge Instructions (Signed)

## 2022-04-20 DIAGNOSIS — R748 Abnormal levels of other serum enzymes: Secondary | ICD-10-CM | POA: Diagnosis present

## 2022-04-20 DIAGNOSIS — R109 Unspecified abdominal pain: Secondary | ICD-10-CM | POA: Diagnosis not present

## 2022-04-20 DIAGNOSIS — I10 Essential (primary) hypertension: Secondary | ICD-10-CM | POA: Diagnosis not present

## 2022-04-20 DIAGNOSIS — J90 Pleural effusion, not elsewhere classified: Secondary | ICD-10-CM | POA: Diagnosis present

## 2022-04-20 DIAGNOSIS — Z862 Personal history of diseases of the blood and blood-forming organs and certain disorders involving the immune mechanism: Secondary | ICD-10-CM

## 2022-04-20 DIAGNOSIS — R591 Generalized enlarged lymph nodes: Secondary | ICD-10-CM | POA: Diagnosis present

## 2022-04-20 LAB — TROPONIN I (HIGH SENSITIVITY)
Troponin I (High Sensitivity): 6 ng/L (ref <18)
Troponin I (High Sensitivity): 8 ng/L (ref <18)

## 2022-04-20 LAB — D-DIMER, QUANTITATIVE: D-Dimer, Quant: 0.9 ug/mL-FEU — ABNORMAL HIGH (ref 0.00–0.50)

## 2022-04-20 MED ORDER — SODIUM CHLORIDE 0.9% FLUSH
3.0000 mL | Freq: Two times a day (BID) | INTRAVENOUS | Status: DC
  Administered 2022-04-21 (×2): 3 mL via INTRAVENOUS

## 2022-04-20 MED ORDER — ACETAMINOPHEN 650 MG RE SUPP: 650.0000 mg | Freq: Four times a day (QID) | RECTAL | Status: DC | PRN

## 2022-04-20 MED ORDER — KETOROLAC TROMETHAMINE 15 MG/ML IJ SOLN
15.0000 mg | Freq: Once | INTRAMUSCULAR | Status: AC
  Administered 2022-04-20: 15 mg via INTRAVENOUS
  Filled 2022-04-20 (×2): qty 1

## 2022-04-20 MED ORDER — LIDOCAINE 5 % EX PTCH
1.0000 | MEDICATED_PATCH | CUTANEOUS | Status: DC
  Administered 2022-04-20 – 2022-04-21 (×2): 1 via TRANSDERMAL
  Filled 2022-04-20 (×2): qty 1

## 2022-04-20 MED ORDER — PREDNISONE 20 MG PO TABS
40.0000 mg | ORAL_TABLET | Freq: Every day | ORAL | Status: DC
  Administered 2022-04-20 – 2022-04-21 (×2): 40 mg via ORAL
  Filled 2022-04-20 (×2): qty 2

## 2022-04-20 MED ORDER — ONDANSETRON HCL 4 MG/2ML IJ SOLN: 4.0000 mg | Freq: Four times a day (QID) | INTRAMUSCULAR | Status: DC | PRN

## 2022-04-20 MED ORDER — HYDROCODONE-ACETAMINOPHEN 5-325 MG PO TABS
1.0000 | ORAL_TABLET | Freq: Four times a day (QID) | ORAL | Status: DC | PRN
  Administered 2022-04-21 (×3): 1 via ORAL
  Filled 2022-04-20 (×3): qty 1

## 2022-04-20 MED ORDER — ENOXAPARIN SODIUM 40 MG/0.4ML IJ SOSY
40.0000 mg | PREFILLED_SYRINGE | INTRAMUSCULAR | Status: DC
  Administered 2022-04-20: 40 mg via SUBCUTANEOUS
  Filled 2022-04-20: qty 0.4

## 2022-04-20 MED ORDER — OXYCODONE-ACETAMINOPHEN 5-325 MG PO TABS
1.0000 | ORAL_TABLET | Freq: Once | ORAL | Status: DC
  Filled 2022-04-20: qty 1

## 2022-04-20 MED ORDER — HYDRALAZINE HCL 20 MG/ML IJ SOLN: 10.0000 mg | INTRAMUSCULAR | Status: DC | PRN

## 2022-04-20 MED ORDER — ALBUTEROL SULFATE (2.5 MG/3ML) 0.083% IN NEBU: 2.5000 mg | INHALATION_SOLUTION | Freq: Four times a day (QID) | RESPIRATORY_TRACT | Status: DC | PRN

## 2022-04-20 MED ORDER — ONDANSETRON HCL 4 MG PO TABS: 4.0000 mg | ORAL_TABLET | Freq: Four times a day (QID) | ORAL | Status: DC | PRN

## 2022-04-20 MED ORDER — HYDROMORPHONE HCL 1 MG/ML IJ SOLN
1.0000 mg | Freq: Once | INTRAMUSCULAR | Status: AC
  Administered 2022-04-20: 1 mg via INTRAVENOUS
  Filled 2022-04-20: qty 1

## 2022-04-20 MED ORDER — ACETAMINOPHEN 325 MG PO TABS: 650.0000 mg | ORAL_TABLET | Freq: Four times a day (QID) | ORAL | Status: DC | PRN

## 2022-04-20 NOTE — H&P (Addendum)
History and Physical    Patient: Michelle Rios WGY:[id number] DOB: Sep 10, 1955 DOA: 04/19/2022 DOS: the patient was seen and examined on 04/20/2022 PCP: Patient, No Pcp Per  Patient coming from: Home  Chief Complaint:  Chief Complaint  Patient presents with   Flank Pain   HPI: Michelle Rios is a 66 y.o. female with medical history significant of pmh hypertension, PE in 1990s, no longer on anticoagulation, chest pain,  history of sarcoidosis, and morbid obesity who presents with plaints of right side pain.  Patient had just recently been hospitalized from 9/16-9/19 for right-sided pleuritic chest pain patient found to be hypoxic with acute kidney injury.  CT imaging had noted patchy groundglass opacities of the right upper lobe, subcentimeter thyroid nodule, and central mesenteric haziness with lymphadenopathy.  Patient had been treated with steroids, Rocephin, and azithromycin.  Rocephin was switched to [facility] with azithromycin at her discharge to complete course.   Patient reports doing medications as prescribed.  However, reported no improvement in the right side pain.  She reports having similar pains in the early 2000's that were thought to possibly be related to her sarcoidosis for which steroids helped resolve symptoms.  Any kind of movement or deep breathing causes sharp she pains on her right side.  Denies having any injury or falls to onset symptoms.  Area is tender to the touch of the lidocaine patch is currently helping.  Patient notes that she chronically has lower extremity swelling this unchanged.  Vital signs otherwise stable with O2 saturation maintained on room air.  Acute abdominal series showed no acute abnormality.  High-sensitivity troponins negative x2.  Liver enzymes was mildly elevated.  CT scan of the chest, abdomen, and pelvis noted new small right pleural effusion with similar changes of the right upper lobe, gallstones mild central haziness prominent jejunal mesenteric  lymph nodes.  Plan was to obtain a VQ scan.  Recommended checking D-dimer which came elevated at 0.9.  Orders placed to admit for observation.  Review of Systems: As mentioned in the history of present illness. All other systems reviewed and are negative. Past Medical History:  Diagnosis Date   AKI (acute kidney injury) ([facility]) 09/15/2015   Anemia    Arthritis    "knees" (02/20/2018)   Asthma    Bilateral swelling of feet    Constipation    Daily headache    Diarrhea 09/15/2015   Hiatal hernia    History of blood transfusion 1979   w/childbirth   Hypertension    Lactose intolerance    Osteoarthritis    Pulmonary embolism ([facility]) 1990s X 1   Rheumatoid arthritis ([facility])    Sarcoidosis of lung ([facility])    SOB (shortness of breath)    Past Surgical History:  Procedure Laterality Date   ANKLE FRACTURE SURGERY Right    KNEE ARTHROSCOPY Right 1990s   TUBAL LIGATION  1990s   VAGINAL HYSTERECTOMY  1990s   WRIST FRACTURE SURGERY Left 2001   Social History:  reports that she has never smoked. She has never used smokeless tobacco. She reports that she does not drink alcohol and does not use drugs.  Allergies  Allergen Reactions   Ivp Dye [Iodinated Contrast Media] Anaphylaxis and Swelling   Voltaren [Diclofenac Sodium] Other (See Comments)    Pt states had bad state in mouth, mouth and tongue turned black, and then pt vomited thick white mucous.    Family History  Problem Relation Age of Onset   Diabetes Mother  Kidney disease Mother    Hyperlipidemia Father    Hypertension Father    Heart attack Father    Heart disease Father    Sudden death Father    Alcoholism Father    Breast cancer Sister    Kidney cancer Sister    Diabetes Brother    Stroke Paternal Grandmother     Prior to Admission medications   Medication Sig Start Date End Date Taking? Authorizing Provider  azithromycin (ZITHROMAX) 250 MG tablet Take 2 tablets (500 mg total) by mouth at bedtime. 04/13/22   Alford Highland, MD  HYDROcodone-acetaminophen (NORCO/VICODIN) 5-325 MG tablet Take 1 tablet by mouth every 6 (six) hours as needed for severe pain. 04/19/22   Mardella Layman, MD  predniSONE (DELTASONE) 10 MG tablet 4 tabs po day1,2; 3 tabs po day3,4; 2 tabs po day5,6; 1 tab po day7,8 04/13/22   Alford Highland, MD  Vitamin D, Ergocalciferol, (DRISDOL) 1.25 MG (50000 UNIT) CAPS capsule Take 1 capsule (50,000 Units total) by mouth every 7 (seven) days. 08/18/21   Langston Reusing, MD    Physical Exam: Vitals:   04/20/22 0157 04/20/22 0549 04/20/22 0914 04/20/22 1259  BP: (!) 145/85 (!) 151/83 (!) 159/77 (!) 172/78  Pulse: 99 77 68 79  Resp: 17 16 18 20   Temp: 98.5 F (36.9 C) 98 F (36.7 C) 98.6 F (37 C) 98.5 F (36.9 C)  TempSrc:   Oral Oral  SpO2: 98% 100% 100% 99%  Weight:      Height:        Constitutional: NAD, calm, comfortable Eyes: PERRL, lids and conjunctivae normal ENMT: Mucous membranes are moist. Posterior pharynx clear of any exudate or lesions.Normal dentition.  Neck: normal, supple, no masses, no thyromegaly Respiratory: clear to auscultation bilaterally, no wheezing, no crackles. Normal respiratory effort. No accessory muscle use.  Cardiovascular: Regular rate and rhythm, no murmurs / rubs / gallops. No extremity edema. 2+ pedal pulses. No carotid bruits.  Abdomen: no tenderness, no masses palpated. No hepatosplenomegaly. Bowel sounds positive.  Musculoskeletal: no clubbing / cyanosis. No joint deformity upper and lower extremities. Good ROM, no contractures. Normal muscle tone.  Skin: no rashes, lesions, ulcers. No induration Neurologic: CN 2-12 grossly intact. Sensation intact, DTR normal. Strength 5/5 in all 4.  Psychiatric: Normal judgment and insight. Alert and oriented x 3. Normal mood.   Data Reviewed:  Reviewed labs, imaging and pertinent records as noted above in HPI.  Assessment and Plan: Right-sided abdominal pain Acute.  Patient presents with complaints  of right side pain.  Recent hospitalization for community-acquired pneumonia for which patient completed treatment.  CT scan noted some gallstones with mild central mesenteric haziness with prominent duodenal mesenteric lymph nodes.  Urinalysis did not show any signs of infection.  D-dimer was noted to be elevated at 0.9.  Plan was to obtain VQ scan.  Unclear if this is related with patient's history of sarcoidosis. -Admit to a medical telemetry bed -Lidocaine patch to right flank -Hydrocodone as needed for pain -Follow-up VQ scan  Right pleural effusion Acute.  Patient noted to have small right-sided pleural effusion.  She chronically has lower extremity swelling that she reports is unchanged from baseline.  Patient does not appear grossly fluid overloaded at this time.  Pleural effusion could be related to what was found to be a community-acquired pneumonia. -Add on BNP  Macrocytic anemia Chronic.  Hemoglobin 10.9 which appears similar to prior. -Continue to monitor  Elevated liver enzymes Acute.  AST  52 and ALT 58 which is mildly elevated. -Add-On acute hepatitis panel -Recheck LFTs in a.m.  Essential hypertension Blood pressures initially elevated up to 177/110.  Patient not on blood pressure medications at baseline. -Hydralazine IV as needed elevated blood pressures  Lymphadenopathy As noted on CT.  May be linked to patient's side pain.  History of sarcoidosis Patient previously followed by Dr. Sherene Sires.  Notes from 04/2018 noted no evidence of active lung disease at that time, but did not appear rule out the diagnosis.   -Resumed prednisone  Morbid obesity BMI 41.57 kg/m    Advance Care Planning:   Code Status: Full Code    Consults: None  Severity of Illness: The appropriate patient status for this patient is OBSERVATION. Observation status is judged to be reasonable and necessary in order to provide the required intensity of service to ensure the patient's safety. The  patient's presenting symptoms, physical exam findings, and initial radiographic and laboratory data in the context of their medical condition is felt to place them at decreased risk for further clinical deterioration. Furthermore, it is anticipated that the patient will be medically stable for discharge from the hospital within 2 midnights of admission.   Author: Clydie Braun, MD 04/20/2022 4:59 PM  For on call review [email or web address].

## 2022-04-20 NOTE — ED Provider Notes (Signed)
[facility] EMERGENCY DEPARTMENT Provider Note   CSN: [id number] Arrival date & time: 04/19/22  1956     History  Chief Complaint  Patient presents with   Flank Pain    Michelle Rios is a 66 y.o. female.  With PMH of sarcoidosis of the lung, obesity, HTN recent admission 9/16 to 04/13/2022 for community-acquired pneumonia and hypoxic respiratory failure discharged on prednisone taper, Omnicef and Zithromax presenting with continued right flank and posterior right chest wall pain.  She says that is stabbing and worse with movement, twisting and breathing.  She had does have a remote history of PE and DVT when she was on Lovenox but is no longer on blood thinners and notes this feels similar.  She has chronic swelling in both of her legs but denies any worsening or new pain.  She has finished her course of antibiotics as well as her steroids and denies any further fevers or infectious symptoms. She notes having chronic cough and intermittent nature and currently dry. No hemoptysis. Also endorses mild worsening dyspnea on exertion.   Flank Pain       Home Medications Prior to Admission medications   Medication Sig Start Date End Date Taking? Authorizing Provider  azithromycin (ZITHROMAX) 250 MG tablet Take 2 tablets (500 mg total) by mouth at bedtime. 04/13/22   Loletha Grayer, MD  HYDROcodone-acetaminophen (NORCO/VICODIN) 5-325 MG tablet Take 1 tablet by mouth every 6 (six) hours as needed for severe pain. 04/19/22   Vanessa Kick, MD  predniSONE (DELTASONE) 10 MG tablet 4 tabs po day1,2; 3 tabs po day3,4; 2 tabs po day5,6; 1 tab po day7,8 04/13/22   Loletha Grayer, MD  Vitamin D, Ergocalciferol, (DRISDOL) 1.25 MG (50000 UNIT) CAPS capsule Take 1 capsule (50,000 Units total) by mouth every 7 (seven) days. 08/18/21   Laqueta Linden, MD      Allergies    Ivp dye [iodinated contrast media] and Voltaren [diclofenac sodium]    Review of Systems   Review of  Systems  Genitourinary:  Positive for flank pain.    Physical Exam Updated Vital Signs BP (!) 172/78 (BP Location: Left Arm)   Pulse 79   Temp 98.5 F (36.9 C) (Oral)   Resp 20   Ht 5\' 1"  (1.549 m)   Wt 99.8 kg   SpO2 99%   BMI 41.57 kg/m  Physical Exam Constitutional: Alert and oriented.  Slightly uncomfortable but nontoxic Eyes: Conjunctivae are normal. ENT      Head: Normocephalic and atraumatic.      Nose: No congestion.      Mouth/Throat: Mucous membranes are moist.      Neck: No stridor. Cardiovascular: S1, S2, warm and dry, regular rate and rhythm Right posterior chest wall and flank tenderness with no external skin changes, no warmth Respiratory: Normal respiratory effort. Breath sounds are normal.  O2 sat 99 on RA. Gastrointestinal: Soft and nontender.  Musculoskeletal: Normal range of motion in all extremities. Bilateral equal nonpitting edema of the lower extremities Neurologic: Normal speech and language.  No facial droop.  Moves all extremities equally.  Sensation grossly intact.  No gross focal neurologic deficits are appreciated. Skin: Skin is warm, dry and intact. No rash noted. Psychiatric: Mood and affect are normal. Speech and behavior are normal.  ED Results / Procedures / Treatments   Labs (all labs ordered are listed, but only abnormal results are displayed) Labs Reviewed  CBC WITH DIFFERENTIAL/PLATELET - Abnormal; Notable for the following components:  Result Value   RBC 3.30 (*)    Hemoglobin 10.9 (*)    HCT 33.5 (*)    MCV 101.5 (*)    All other components within normal limits  COMPREHENSIVE METABOLIC PANEL - Abnormal; Notable for the following components:   Glucose, Bld 121 (*)    AST 52 (*)    ALT 58 (*)    All other components within normal limits  URINALYSIS, ROUTINE W REFLEX MICROSCOPIC - Abnormal; Notable for the following components:   pH 8.5 (*)    All other components within normal limits  LIPASE, BLOOD  TROPONIN I (HIGH  SENSITIVITY)  TROPONIN I (HIGH SENSITIVITY)  TROPONIN I (HIGH SENSITIVITY)    EKG EKG Interpretation  Date/Time:  Monday April 19 2022 20:22:55 EDT Ventricular Rate:  101 PR Interval:  148 QRS Duration: 72 QT Interval:  310 QTC Calculation: 401 R Axis:   27 Text Interpretation: Sinus tachycardia Nonspecific ST and T wave abnormality Abnormal ECG When compared with ECG of 10-Apr-2022 19:24, PREVIOUS ECG IS PRESENT Nonspecific T wave flattening lateral leads similar to previous EKGs Confirmed by Vivien Rossetti ([id number]) on 04/20/2022 2:06:02 PM  Radiology DG Abdomen Acute W/Chest  Result Date: 04/19/2022 CLINICAL DATA:  Chest abdominal pain, dyspnea EXAM: DG ABDOMEN ACUTE WITH 1 VIEW CHEST COMPARISON:  Concurrently performed CT examination FINDINGS: No new infiltrate within the right upper lobe is not well appreciated on this examination. Lungs are clear. No pneumothorax or pleural effusion. Cardiac size within normal limits. Pulmonary vascularity is normal. Normal abdominal gas pattern. No free intraperitoneal gas. No organomegaly. No nephro or urolithiasis. Multiple phleboliths within the pelvis. Degenerative changes are seen within the lumbosacral junction. IMPRESSION: 1. No radiographic evidence of acute cardiopulmonary disease. 2. Normal abdominal gas pattern. Electronically Signed   By: Helyn Numbers M.D.   On: 04/19/2022 21:14   CT CHEST ABDOMEN PELVIS WO CONTRAST  Result Date: 04/19/2022 CLINICAL DATA:  Patient complains of right chest and flank pain. EXAM: CT CHEST, ABDOMEN AND PELVIS WITHOUT CONTRAST TECHNIQUE: Multidetector CT imaging of the chest, abdomen and pelvis was performed following the standard protocol without IV contrast. RADIATION DOSE REDUCTION: This exam was performed according to the departmental dose-optimization program which includes automated exposure control, adjustment of the mA and/or kV according to patient size and/or use of iterative reconstruction  technique. COMPARISON:  04/10/2022 FINDINGS: CT CHEST FINDINGS Cardiovascular: Heart size appears within normal limits. Aortic atherosclerosis. Coronary artery calcification. Mediastinum/Nodes: No enlarged axillary or mediastinal lymph nodes. Thyroid gland, trachea, and esophagus are unremarkable. Lungs/Pleura: There is a small right pleural effusion which appearance is new from the previous exam, image 43/3. Unchanged appearance of asymmetric right upper lobe ground-glass attenuation, architectural distortion, and thickening of the peribronchovascular interstitium. There is volume loss within the right upper lobe which is also unchanged in the interval. Paraseptal emphysema identified. Musculoskeletal: Spondylosis identified within the thoracic spine. No acute or suspicious osseous findings identified. CT ABDOMEN PELVIS FINDINGS Hepatobiliary: No focal liver abnormality identified. Small stones layer within the dependent portion of the gallbladder, image 71/3. No signs of gallbladder wall thickening or inflammation. Pancreas: Unremarkable. No pancreatic ductal dilatation or surrounding inflammatory changes. Spleen: Normal in size without focal abnormality. Adrenals/Urinary Tract: Normal adrenal glands. No nephrolithiasis, hydronephrosis or mass. Urinary bladder is unremarkable. Stomach/Bowel: Stomach appears normal. The appendix is visualized and appears normal. There is a moderate stool burden identified within the right colon. No bowel wall thickening, inflammation, or distension. Vascular/Lymphatic: Aortic atherosclerosis. No aneurysm. No  signs of abdominopelvic adenopathy. Soft tissue stranding within the jejunal mesentery within the left hemiabdomen is again noted but appears improved in the interval. The prominent jejunal mesenteric lymph nodes measure up to 0.9 cm on today's study versus 1 cm previously. Reproductive: Status post hysterectomy. No adnexal masses. Other: No free fluid or fluid collections.  Musculoskeletal: No acute or significant osseous findings. IMPRESSION: 1. New small right pleural effusion. 2. Similar appearance of asymmetric ground-glass attenuation, architectural distortion, and thickening of the peribronchovascular interstitium within the right upper lobe. Imaging findings are nonspecific but may represent sequelae of inflammation or infection. Recommend short-term interval follow-up with repeat imaging in 3 months turn sure resolution. 3. Gallstones. 4. Stable to improved appearance of mild central mesenteric haziness with prominent jejunal mesenteric lymph nodes. 5. Aortic Atherosclerosis (ICD10-I70.0) and Emphysema (ICD10-J43.9). Electronically Signed   By: Signa Kell M.D.   On: 04/19/2022 20:59   DG Chest 2 View  Result Date: 04/19/2022 CLINICAL DATA:  Right lower chest pain.  Sarcoidosis. EXAM: CHEST - 2 VIEW COMPARISON:  CT scan 04/10/2022 FINDINGS: Atherosclerotic calcification of the aortic arch. Borderline enlargement of the cardiopericardial silhouette, without edema. Suspected right perihilar scarring in the upper lobe with hazy density in this region corresponding to the density shown on chest CT of 04/10/2022. Slight blunting of the right lateral costophrenic angle, cannot exclude a trace right pleural effusion. Degenerative glenohumeral spurring bilaterally. IMPRESSION: 1. Stable hazy nonspecific opacity in the right upper lobe, primarily reticular in nature. 2. Trace blunting of the right lateral costophrenic angle, cannot exclude a small right pleural effusion although no effusion was seen on 04/10/2022. 3. Aortic Atherosclerosis (ICD10-I70.0). 4. Borderline cardiomegaly. 5. Thoracic spondylosis and bilateral degenerative glenohumeral arthropathy. Electronically Signed   By: Gaylyn Rong M.D.   On: 04/19/2022 14:15    Procedures Procedures    Medications Ordered in ED Medications  ketorolac (TORADOL) 15 MG/ML injection 15 mg (0 mg Intravenous Hold 04/20/22  1521)  HYDROmorphone (DILAUDID) injection 1 mg (has no administration in time range)  lidocaine (LIDODERM) 5 % 1 patch (has no administration in time range)    ED Course/ Medical Decision Making/ A&P                           Medical Decision Making  JENNEY BRESTER is a 66 y.o. female.  With PMH of sarcoidosis of the lung, obesity, HTN recent admission 9/16 to 04/13/2022 for community-acquired pneumonia and hypoxic respiratory failure discharged on prednisone taper, Omnicef and Zithromax presenting with continued right flank and posterior right chest wall pain.   Patient recently admitted at outside hospital for hypoxic respiratory failure secondary to community-acquired pneumonia.  She has completed antibiotics and steroid taper.  Repeat CT chest abdomen pelvis here showed continued scarring and groundglass attenuation of the right upper lobe which I suspect is residual inflammation from previous infection.  She is afebrile here with a normal white blood cell count 10.1 with no productive cough, do not think she has continued pneumonia.  She had no evidence of kidney stone or acute intra-abdominal pathology other than gallstones but no right upper quadrant pain and no vomiting or symptoms suggestive of symptomatic gallstones.  UA showed no signs of UTI ruling out pyelonephritis.  However, her CT scan did show nonspecific right pleural effusion which is new for patient in the location of her pain.  Unfortunately because patient has anaphylactic reaction to contrast dye, we are unable to  obtain CTA chest to further rule out PE.  Due to her previous history of clots and recent hospitalization, she is high risk for PE.  I discussed with radiologist who recommended VQ scan.  Unfortunately at time of ordering, they are unable to perform any further V/Q scans today.  I have ordered for pain medications for patient and will reach out to hospitalist for observation to obtain VQ scan tomorrow.  Amount  and/or Complexity of Data Reviewed Radiology: ordered.  Risk Prescription drug management.    Final Clinical Impression(s) / ED Diagnoses Final diagnoses:  Right flank pain  Dyspnea on exertion  Pleural effusion on right    Rx / DC Orders ED Discharge Orders     None         Mardene Sayer, MD 04/20/22 1537

## 2022-04-20 NOTE — ED Notes (Signed)
Patient placed on hospital bed and given warm blankets for comfort.

## 2022-04-20 NOTE — Plan of Care (Deleted)
Called to admit Mrs. Calligan is a 66 year old female with pmh hypertension, chronic chest pain, and morbid obesity who presents with continued right-sided chest pain.  Patient had just recently been hospitalized from 9/16-9/19 for right-sided pleuritic chest pain patient found to be hypoxic with acute kidney injury.  Patient has been treated with steroids, Rocephin, and azithromycin.  Rocephin was switched to [facility] with azithromycin to complete course.  Symptoms resolved with symptomatic treatment and appears she was discharged home on room air.  However, patient presents back with similar complaints.  Vital signs otherwise stable with O2 saturation maintained on room air.  Acute abdominal series showed no acute abnormality.  High-sensitivity troponins negative x2.  Liver enzymes was mildly elevated.  Plan was to obtain a VQ scan, but noted unable to likely be done today which patient would stay overnight.  Deferred admission at this time recommending obtaining a D-dimer and trial of giving patient oral medications to try and control pain.

## 2022-04-20 NOTE — ED Notes (Signed)
Report given to Rachel S. RN

## 2022-04-20 NOTE — ED Provider Notes (Signed)
  Physical Exam  BP (!) 172/78 (BP Location: Left Arm)   Pulse 79   Temp 98.5 F (36.9 C) (Oral)   Resp 20   Ht 5\' 1"  (1.549 m)   Wt 99.8 kg   SpO2 99%   BMI 41.57 kg/m   Physical Exam  Procedures  Procedures  ED Course / MDM    Medical Decision Making Amount and/or Complexity of Data Reviewed Labs: ordered. Radiology: ordered.  Risk Prescription drug management. Decision regarding hospitalization.   76F, hx of sarcoidosis, previous PE, endorses right sided back/chest pain, pleuritic, some tenderness to palpation. Dyspnea on exertion. Has an IV contrast allergy. CT C/A/P showing a RUL PNA, right sided pleural effusion. VQ scan to evaluate for PE. Finished course of Omnicef and Azithromycin.  Admitted from 9/16-9/19 finished course of ABX from that admission.   1610 Informed that a VQ scan will not occur today. Spoke with Dr. Fuller Plan of hospitalist medicine who did not accept the patient in admission, recommended obtaining a d-dimer. D-dimer ordered.   1649 D-dimer resulted positive at 0.90. Medicine re-engaged for admission as patient has been in the ED for over 20 hours and VQ scan will not be able to be performed tonight. Dr. Tamala Julian accepted the patient in admission.       Regan Lemming, MD 04/20/22 [phone]

## 2022-04-20 NOTE — ED Notes (Signed)
Patient ambulated to restroom with a steady gait

## 2022-04-21 ENCOUNTER — Observation Stay (HOSPITAL_COMMUNITY): Payer: Medicare HMO

## 2022-04-21 ENCOUNTER — Other Ambulatory Visit (HOSPITAL_COMMUNITY): Payer: Self-pay

## 2022-04-21 DIAGNOSIS — R079 Chest pain, unspecified: Secondary | ICD-10-CM | POA: Diagnosis not present

## 2022-04-21 DIAGNOSIS — R918 Other nonspecific abnormal finding of lung field: Secondary | ICD-10-CM | POA: Diagnosis not present

## 2022-04-21 DIAGNOSIS — J9811 Atelectasis: Secondary | ICD-10-CM | POA: Diagnosis not present

## 2022-04-21 DIAGNOSIS — D649 Anemia, unspecified: Secondary | ICD-10-CM | POA: Diagnosis not present

## 2022-04-21 DIAGNOSIS — R109 Unspecified abdominal pain: Secondary | ICD-10-CM | POA: Diagnosis not present

## 2022-04-21 DIAGNOSIS — D860 Sarcoidosis of lung: Secondary | ICD-10-CM | POA: Diagnosis not present

## 2022-04-21 LAB — BASIC METABOLIC PANEL
Anion gap: 11 (ref 5–15)
BUN: 21 mg/dL (ref 8–23)
CO2: 27 mmol/L (ref 22–32)
Calcium: 9.5 mg/dL (ref 8.9–10.3)
Chloride: 100 mmol/L (ref 98–111)
Creatinine, Ser: 0.82 mg/dL (ref 0.44–1.00)
GFR, Estimated: >60 mL/min (ref >60)
Glucose, Bld: 132 mg/dL — ABNORMAL HIGH (ref 70–99)
Potassium: 4.7 mmol/L (ref 3.5–5.1)
Sodium: 138 mmol/L (ref 135–145)

## 2022-04-21 LAB — CBC
HCT: 29.2 % — ABNORMAL LOW (ref 36.0–46.0)
Hemoglobin: 9.9 g/dL — ABNORMAL LOW (ref 12.0–15.0)
MCH: 33.6 pg (ref 26.0–34.0)
MCHC: 33.9 g/dL (ref 30.0–36.0)
MCV: 99 fL (ref 80.0–100.0)
Platelets: 281 10*3/uL (ref 150–400)
RBC: 2.95 MIL/uL — ABNORMAL LOW (ref 3.87–5.11)
RDW: 11.8 % (ref 11.5–15.5)
WBC: 7.3 10*3/uL (ref 4.0–10.5)
nRBC: 0 % (ref 0.0–0.2)

## 2022-04-21 LAB — HEPATIC FUNCTION PANEL
ALT: 60 U/L — ABNORMAL HIGH (ref 0–44)
AST: 46 U/L — ABNORMAL HIGH (ref 15–41)
Albumin: 3.4 g/dL — ABNORMAL LOW (ref 3.5–5.0)
Alkaline Phosphatase: 95 U/L (ref 38–126)
Bilirubin, Direct: <0.1 mg/dL (ref 0.0–0.2)
Total Bilirubin: 0.6 mg/dL (ref 0.3–1.2)
Total Protein: 7.6 g/dL (ref 6.5–8.1)

## 2022-04-21 LAB — BRAIN NATRIURETIC PEPTIDE: B Natriuretic Peptide: 25.6 pg/mL (ref 0.0–100.0)

## 2022-04-21 LAB — HEPATITIS PANEL, ACUTE
HCV Ab: NONREACTIVE
Hep A IgM: NONREACTIVE
Hep B C IgM: NONREACTIVE
Hepatitis B Surface Ag: NONREACTIVE

## 2022-04-21 MED ORDER — TECHNETIUM TO 99M ALBUMIN AGGREGATED
4.3000 | Freq: Once | INTRAVENOUS | Status: AC | PRN
  Administered 2022-04-21: 4.3 via INTRAVENOUS

## 2022-04-21 MED ORDER — POLYETHYLENE GLYCOL 3350 17 G PO PACK: 17.0000 g | PACK | Freq: Every day | ORAL | 0 refills | Status: DC | PRN

## 2022-04-21 MED ORDER — HYDROCODONE-ACETAMINOPHEN 5-325 MG PO TABS
1.0000 | ORAL_TABLET | Freq: Four times a day (QID) | ORAL | 0 refills | Status: DC | PRN
  Filled 2022-04-21: qty 15, 4d supply, fill #0

## 2022-04-21 MED ORDER — IBUPROFEN 800 MG PO TABS
400.0000 mg | ORAL_TABLET | Freq: Four times a day (QID) | ORAL | 0 refills | Status: DC | PRN
  Filled 2022-04-21: qty 30, 15d supply, fill #0

## 2022-04-21 MED ORDER — KETOROLAC TROMETHAMINE 15 MG/ML IJ SOLN: 15.0000 mg | Freq: Once | INTRAMUSCULAR | Status: DC

## 2022-04-21 NOTE — Discharge Summary (Signed)
Physician Discharge Summary  Michelle Rios:[id number] DOB: 06/09/1956 DOA: 04/19/2022  PCP: Patient, No Pcp Per  Admit date: 04/19/2022 Discharge date: 04/21/2022 Discharging to: School have long forgiveness and testshome Recommendations for Outpatient Follow-up:  Please F/u LFTs in 1-2 wks   Discharge Diagnoses:   Principal Problem:   Right flank pain Active Problems:   Pleural effusion, right   History of sarcoidosis   Essential hypertension   Obesity, Class III, BMI 40-49.9 (morbid obesity) ([place])   Lymphadenopathy   Elevated liver enzymes     Hospital Course:  This is a 66 year old female with hypertension history of sarcoidosis PE in the past, morbid obesity who presents to the hospital with right-sided flank pain that is worse with movement and deep breathing. She was at [facility] for treatment of community-acquired pneumonia from 9/16 through 9/19 and had this pain at that time as well.  Principal Problem:   Right flank pain -VQ scan performed today is negative for PE - She states hydrocodone is helping her with the pain - I believe it is musculoskeletal-have recommended rest, ice/heat and NSAIDs- will give her prescription for hydrocodone as below to use only in case of severe pain  Active Problems: Community-acquired pneumonia -pleural effusion, right -Has completed ceftriaxone and azithromycin  Elevated liver enzymes - mild rise in LFTs - f/u as outpt    History of sarcoidosis - no active issues at this time    Obesity, Class III, BMI 40-49.9 (morbid obesity) ([facility]) Body mass index is 41.57 kg/m.           Discharge Instructions  Discharge Instructions     Diet - low sodium heart healthy   Complete by: As directed    Increase activity slowly   Complete by: As directed       Allergies as of 04/21/2022       Reactions   Ivp Dye [iodinated Contrast Media] Anaphylaxis, Swelling   Voltaren [diclofenac Sodium] Other  (See Comments)   Pt states had bad state in mouth, mouth and tongue turned black, and then pt vomited thick white mucous.        Medication List     STOP taking these medications    azithromycin 250 MG tablet Commonly known as: ZITHROMAX   predniSONE 10 MG tablet Commonly known as: DELTASONE       TAKE these medications    acetaminophen 325 MG tablet Commonly known as: TYLENOL Take 650 mg by mouth as needed for mild pain or moderate pain.   HYDROcodone-acetaminophen 5-325 MG tablet Commonly known as: NORCO/VICODIN Take 1 tablet by mouth every 6 (six) hours as needed for severe pain.   ibuprofen 800 MG tablet Commonly known as: ADVIL Take 0.5 tablets (400 mg total) by mouth every 6 (six) hours as needed (pain).   polyethylene glycol 17 g packet Commonly known as: MiraLax Take 17 g by mouth daily as needed.   Vitamin D (Ergocalciferol) 1.25 MG (50000 UNIT) Caps capsule Commonly known as: DRISDOL Take 1 capsule (50,000 Units total) by mouth every 7 (seven) days.            The results of significant diagnostics from this hospitalization (including imaging, microbiology, ancillary and laboratory) are listed below for reference.    NM Pulmonary Perfusion  Result Date: 04/21/2022 CLINICAL DATA:  Short of breath, concern for pulmonary embolism. D-dimer mildly elevated history of pulmonary sarcoidosis. EXAM: NUCLEAR MEDICINE PERFUSION LUNG SCAN TECHNIQUE: Perfusion images were obtained in multiple  projections after intravenous injection of radiopharmaceutical. RADIOPHARMACEUTICALS:  4.3 mCi Tc-42m MAA COMPARISON:  Chest radiograph 04/21/2022, CT chest 04/19/2022 FINDINGS: No wedge-shaped peripheral perfusion defects to suggest acute pulmonary embolism. There is regional loss of perfusion to the RIGHT upper lobe. This corresponds to chronic peribronchial thickening and atelectasis within the RIGHT upper lobe on recent CT of the chest. Additionally CTA chest from 2001  identified chronic changes RIGHT upper lobe with oligemia (no images available). IMPRESSION: 1. No evidence acute pulmonary embolism. 2. Marked decreased perfusion to the RIGHT upper lobe is favored sequelae of chronic sarcoidosis with volume loss and chronic scarring in the RIGHT upper lobe. Electronically Signed   By: Genevive Bi M.D.   On: 04/21/2022 13:43   DG Chest Port 1 View  Result Date: 04/21/2022 CLINICAL DATA:  RIGHT flank pain EXAM: PORTABLE CHEST 1 VIEW COMPARISON:  Portable exam 0827 hours compared to 04/19/2022 FINDINGS: Upper normal size of cardiac silhouette. Mediastinal contours and pulmonary vascularity normal. Atherosclerotic calcification aorta. Asymmetric increased pulmonary markings RIGHT lung versus LEFT, unchanged from multiple prior exams, question scarring. Lungs otherwise clear. No pleural effusion or pneumothorax. Endplate spur formation thoracic spine. IMPRESSION: Chronic accentuation of interstitial markings in RIGHT lung versus LEFT, present since at least 2017 question scarring. No new abnormalities. Aortic Atherosclerosis (ICD10-I70.0). Electronically Signed   By: Ulyses Southward M.D.   On: 04/21/2022 08:36   DG Abdomen Acute W/Chest  Result Date: 04/19/2022 CLINICAL DATA:  Chest abdominal pain, dyspnea EXAM: DG ABDOMEN ACUTE WITH 1 VIEW CHEST COMPARISON:  Concurrently performed CT examination FINDINGS: No new infiltrate within the right upper lobe is not well appreciated on this examination. Lungs are clear. No pneumothorax or pleural effusion. Cardiac size within normal limits. Pulmonary vascularity is normal. Normal abdominal gas pattern. No free intraperitoneal gas. No organomegaly. No nephro or urolithiasis. Multiple phleboliths within the pelvis. Degenerative changes are seen within the lumbosacral junction. IMPRESSION: 1. No radiographic evidence of acute cardiopulmonary disease. 2. Normal abdominal gas pattern. Electronically Signed   By: Helyn Numbers M.D.   On:  04/19/2022 21:14   CT CHEST ABDOMEN PELVIS WO CONTRAST  Result Date: 04/19/2022 CLINICAL DATA:  Patient complains of right chest and flank pain. EXAM: CT CHEST, ABDOMEN AND PELVIS WITHOUT CONTRAST TECHNIQUE: Multidetector CT imaging of the chest, abdomen and pelvis was performed following the standard protocol without IV contrast. RADIATION DOSE REDUCTION: This exam was performed according to the departmental dose-optimization program which includes automated exposure control, adjustment of the mA and/or kV according to patient size and/or use of iterative reconstruction technique. COMPARISON:  04/10/2022 FINDINGS: CT CHEST FINDINGS Cardiovascular: Heart size appears within normal limits. Aortic atherosclerosis. Coronary artery calcification. Mediastinum/Nodes: No enlarged axillary or mediastinal lymph nodes. Thyroid gland, trachea, and esophagus are unremarkable. Lungs/Pleura: There is a small right pleural effusion which appearance is new from the previous exam, image 43/3. Unchanged appearance of asymmetric right upper lobe ground-glass attenuation, architectural distortion, and thickening of the peribronchovascular interstitium. There is volume loss within the right upper lobe which is also unchanged in the interval. Paraseptal emphysema identified. Musculoskeletal: Spondylosis identified within the thoracic spine. No acute or suspicious osseous findings identified. CT ABDOMEN PELVIS FINDINGS Hepatobiliary: No focal liver abnormality identified. Small stones layer within the dependent portion of the gallbladder, image 71/3. No signs of gallbladder wall thickening or inflammation. Pancreas: Unremarkable. No pancreatic ductal dilatation or surrounding inflammatory changes. Spleen: Normal in size without focal abnormality. Adrenals/Urinary Tract: Normal adrenal glands. No nephrolithiasis, hydronephrosis or  mass. Urinary bladder is unremarkable. Stomach/Bowel: Stomach appears normal. The appendix is visualized  and appears normal. There is a moderate stool burden identified within the right colon. No bowel wall thickening, inflammation, or distension. Vascular/Lymphatic: Aortic atherosclerosis. No aneurysm. No signs of abdominopelvic adenopathy. Soft tissue stranding within the jejunal mesentery within the left hemiabdomen is again noted but appears improved in the interval. The prominent jejunal mesenteric lymph nodes measure up to 0.9 cm on today's study versus 1 cm previously. Reproductive: Status post hysterectomy. No adnexal masses. Other: No free fluid or fluid collections. Musculoskeletal: No acute or significant osseous findings. IMPRESSION: 1. New small right pleural effusion. 2. Similar appearance of asymmetric ground-glass attenuation, architectural distortion, and thickening of the peribronchovascular interstitium within the right upper lobe. Imaging findings are nonspecific but may represent sequelae of inflammation or infection. Recommend short-term interval follow-up with repeat imaging in 3 months turn sure resolution. 3. Gallstones. 4. Stable to improved appearance of mild central mesenteric haziness with prominent jejunal mesenteric lymph nodes. 5. Aortic Atherosclerosis (ICD10-I70.0) and Emphysema (ICD10-J43.9). Electronically Signed   By: Signa Kell M.D.   On: 04/19/2022 20:59   DG Chest 2 View  Result Date: 04/19/2022 CLINICAL DATA:  Right lower chest pain.  Sarcoidosis. EXAM: CHEST - 2 VIEW COMPARISON:  CT scan 04/10/2022 FINDINGS: Atherosclerotic calcification of the aortic arch. Borderline enlargement of the cardiopericardial silhouette, without edema. Suspected right perihilar scarring in the upper lobe with hazy density in this region corresponding to the density shown on chest CT of 04/10/2022. Slight blunting of the right lateral costophrenic angle, cannot exclude a trace right pleural effusion. Degenerative glenohumeral spurring bilaterally. IMPRESSION: 1. Stable hazy nonspecific  opacity in the right upper lobe, primarily reticular in nature. 2. Trace blunting of the right lateral costophrenic angle, cannot exclude a small right pleural effusion although no effusion was seen on 04/10/2022. 3. Aortic Atherosclerosis (ICD10-I70.0). 4. Borderline cardiomegaly. 5. Thoracic spondylosis and bilateral degenerative glenohumeral arthropathy. Electronically Signed   By: Gaylyn Rong M.D.   On: 04/19/2022 14:15   DG Ribs Unilateral Right  Result Date: 04/12/2022 CLINICAL DATA:  Right lateral rib pain. EXAM: RIGHT RIBS - 2 VIEW COMPARISON:  Chest CT 2 days ago. FINDINGS: No fracture or other bone lesions are seen involving the ribs. Right glenohumeral osteoarthritis is partially assessed. IMPRESSION: Unremarkable radiographic appearance of the right ribs. Electronically Signed   By: Narda Rutherford M.D.   On: 04/12/2022 15:38   MR ANGIO CHEST W WO CONTRAST  Result Date: 04/11/2022 CLINICAL DATA:  Chest pain EXAM: MRA CHEST WITH AND WITHOUT CONTRAST TECHNIQUE: Angiographic images of the chest were obtained using MRA technique chest CT 04/10/2022 intravenous contrast. CONTRAST:  61mL GADAVIST GADOBUTROL 1 MMOL/ML IV SOLN COMPARISON:  None Available. FINDINGS: VASCULAR Aorta: Normal course and caliber of the aorta.  No dissection. Heart: Heart size is normal. Pulmonary Arteries:  Normal pulmonary arteries. Other: None NON-VASCULAR Mediastinum: Unremarkable.  No lymphadenopathy. Lungs: Patchy areas hyperintense signal in the right parahilar region Bones: Unremarkable. IMPRESSION: 1. Normal MRA of the thoracic aorta. 2. Patchy areas of hyperintense signal in the right parahilar region, better characterized on earlier chest CT. Electronically Signed   By: Deatra Robinson M.D.   On: 04/11/2022 00:06   US ABDOMEN LIMITED RUQ (LIVER/GB)  Result Date: 04/10/2022 CLINICAL DATA:  [id number].  Right upper quadrant pain.  Obesity. EXAM: ULTRASOUND ABDOMEN LIMITED RIGHT UPPER QUADRANT COMPARISON:  None  Available. FINDINGS: Gallbladder: No gallstones or wall thickening visualized. No sonographic  Murphy sign noted by sonographer. Common bile duct: Diameter: 3 mm. Liver: No focal lesion identified. Increased parenchymal echogenicity. Portal vein is patent on color Doppler imaging with normal direction of blood flow towards the liver. Other: None. IMPRESSION: 1. Hepatic steatosis. Please note limited evaluation for focal hepatic masses in a patient with hepatic steatosis due to decreased penetration of the acoustic ultrasound waves. 2. Limited evaluation of left hepatic lobe. Electronically Signed   By: Tish Frederickson M.D.   On: 04/10/2022 23:20   US Venous Img Lower Bilateral  Result Date: 04/10/2022 CLINICAL DATA:  Leg swelling. EXAM: BILATERAL LOWER EXTREMITY VENOUS DOPPLER ULTRASOUND TECHNIQUE: Gray-scale sonography with graded compression, as well as color Doppler and duplex ultrasound were performed to evaluate the lower extremity deep venous systems from the level of the common femoral vein and including the common femoral, femoral, profunda femoral, popliteal and calf veins including the posterior tibial, peroneal and gastrocnemius veins when visible. The superficial great saphenous vein was also interrogated. Spectral Doppler was utilized to evaluate flow at rest and with distal augmentation maneuvers in the common femoral, femoral and popliteal veins. COMPARISON:  None Available. FINDINGS: RIGHT LOWER EXTREMITY Common Femoral Vein: No evidence of thrombus. Normal compressibility, respiratory phasicity and response to augmentation. Saphenofemoral Junction: No evidence of thrombus. Normal compressibility and flow on color Doppler imaging. Profunda Femoral Vein: No evidence of thrombus. Normal compressibility and flow on color Doppler imaging. Femoral Vein: No evidence of thrombus. Normal compressibility, respiratory phasicity and response to augmentation. Popliteal Vein: No evidence of thrombus. Normal  compressibility, respiratory phasicity and response to augmentation. Calf Veins: No evidence of thrombus. Normal compressibility and flow on color Doppler imaging. Superficial Great Saphenous Vein: No evidence of thrombus. Normal compressibility. Venous Reflux:  None. Other Findings:  None. LEFT LOWER EXTREMITY Common Femoral Vein: No evidence of thrombus. Normal compressibility, respiratory phasicity and response to augmentation. Saphenofemoral Junction: No evidence of thrombus. Normal compressibility and flow on color Doppler imaging. Profunda Femoral Vein: No evidence of thrombus. Normal compressibility and flow on color Doppler imaging. Femoral Vein: No evidence of thrombus. Normal compressibility, respiratory phasicity and response to augmentation. Popliteal Vein: No evidence of thrombus. Normal compressibility, respiratory phasicity and response to augmentation. Calf Veins: No evidence of thrombus. Normal compressibility and flow on color Doppler imaging. Superficial Great Saphenous Vein: No evidence of thrombus. Normal compressibility. Venous Reflux:  None. Other Findings:  None. IMPRESSION: No evidence of deep venous thrombosis in either lower extremity. Electronically Signed   By: Darliss Cheney M.D.   On: 04/10/2022 23:08   CT CHEST ABDOMEN PELVIS WO CONTRAST  Result Date: 04/10/2022 CLINICAL DATA:  Right-sided chest pain. EXAM: CT CHEST, ABDOMEN AND PELVIS WITHOUT CONTRAST TECHNIQUE: Multidetector CT imaging of the chest, abdomen and pelvis was performed following the standard protocol without IV contrast. RADIATION DOSE REDUCTION: This exam was performed according to the departmental dose-optimization program which includes automated exposure control, adjustment of the mA and/or kV according to patient size and/or use of iterative reconstruction technique. COMPARISON:  CT abdomen and pelvis 02/20/2018 FINDINGS: CT CHEST FINDINGS Cardiovascular: Heart is borderline enlarged. Aorta is normal in size.  There are atherosclerotic calcifications of the aorta. Mediastinum/Nodes: There is a 6 mm hypodense left thyroid nodule. No enlarged mediastinal lymph nodes are seen. Difficult to assess for hilar adenopathy secondary to lack of contrast. Esophagus is within normal limits. Lungs/Pleura: There are patchy ground-glass opacities throughout the right upper lobe. Mild emphysematous changes are seen. The lungs are otherwise clear.  No pleural effusion or pneumothorax identified. Musculoskeletal: There is DISH of the thoracic spine. CT ABDOMEN PELVIS FINDINGS Hepatobiliary: No focal liver abnormality is seen. No gallstones, gallbladder wall thickening, or biliary dilatation. Pancreas: Unremarkable. No pancreatic ductal dilatation or surrounding inflammatory changes. Spleen: Normal in size without focal abnormality. Adrenals/Urinary Tract: Adrenal glands are unremarkable. Kidneys are normal, without renal calculi, focal lesion, or hydronephrosis. Bladder is unremarkable. Stomach/Bowel: Stomach is within normal limits. Appendix appears normal. No evidence of bowel wall thickening, distention, or inflammatory changes. There is sigmoid and descending colon diverticulosis. Vascular/Lymphatic: Aorta and IVC are normal in size. There are atherosclerotic calcifications of the aorta. There are nonenlarged and mildly enlarged central mesenteric lymph nodes measuring up to 11 mm with some central mesenteric haziness. This is a new finding. Reproductive: Status post hysterectomy. No adnexal masses. Other: No abdominal wall hernia or abnormality. No abdominopelvic ascites. Musculoskeletal: No acute or significant osseous findings. IMPRESSION: 1. Patchy ground-glass opacities in the right upper lobe, likely infectious/inflammatory. Follow-up chest CT recommended in 2 months to re-evaluate. 2. Subcentimeter incidental left thyroid nodule. No follow-up imaging is recommended. Reference: J Am Coll Radiol. 2015 Feb;12(2): 143-50 3. Central  mesenteric haziness with lymphadenopathy may be related to infection/inflammation. Other etiologies (neoplastic) can not be excluded in the appropriate clinical setting. 4. Colonic diverticulosis. Electronically Signed   By: Darliss Cheney M.D.   On: 04/10/2022 21:22   DG Chest Portable 1 View  Result Date: 04/10/2022 CLINICAL DATA:  Chest pain. EXAM: PORTABLE CHEST 1 VIEW COMPARISON:  Chest x-ray 09/29/2018 FINDINGS: The heart size and mediastinal contours are within normal limits. Both lungs are clear. The visualized skeletal structures are unremarkable. IMPRESSION: No active disease. Electronically Signed   By: Darliss Cheney M.D.   On: 04/10/2022 19:44   Labs:   Basic Metabolic Panel: Recent Labs  Lab 04/19/22 2022 04/21/22 0601  NA 140 138  K 3.9 4.7  CL 102 100  CO2 26 27  GLUCOSE 121* 132*  BUN 10 21  CREATININE 0.79 0.82  CALCIUM 9.5 9.5     CBC: Recent Labs  Lab 04/19/22 2022 04/21/22 0601  WBC 10.1 7.3  NEUTROABS 6.4  --   HGB 10.9* 9.9*  HCT 33.5* 29.2*  MCV 101.5* 99.0  PLT 358 281         SIGNED:   Calvert Cantor, MD  [facility] 04/21/2022, 4:19 PM

## 2022-04-21 NOTE — Plan of Care (Signed)

## 2022-04-21 NOTE — Progress Notes (Signed)
Patient just arrived from ED, VSS, pain med given, no skin issues, call light within reach, will continue to monitor

## 2022-04-21 NOTE — Plan of Care (Signed)

## 2022-04-21 NOTE — ED Provider Notes (Signed)
[facility]   [id number] 04/19/22 Arrival Time: 1232  ASSESSMENT & PLAN:  1. Right-sided chest pain    Very TTP over R lower ribs and this reproduces pain described by pt. I have personally viewed the imaging studies ordered this visit. I do not appreciate any acute changes on her CXR that would explain her R-sided CP. Discussed. Question chest wall pain from coughing.  Meds ordered this encounter  Medications   HYDROcodone-acetaminophen (NORCO/VICODIN) 5-325 MG tablet    Sig: Take 1 tablet by mouth every 6 (six) hours as needed for severe pain.    Dispense:  10 tablet    Refill:  0   BP 130/85 (BP Location: Right Arm)   Pulse 86   Temp 98.6 F (37 C) (Oral)   Resp 20   SpO2 100%  VSS.   Follow-up Information     [facility] EMERGENCY DEPARTMENT.   Specialty: Emergency Medicine Why: If symptoms worsen in any way. Contact information: [address] [id number] mc [place] [address] [phone]                Reviewed expectations re: course of current medical issues. Questions answered. Outlined signs and symptoms indicating need for more acute intervention. Understanding verbalized. After Visit Summary given.   SUBJECTIVE: History from: Patient. Michelle Rios is a 66 y.o. female. Reports: in hospital last week; dx PNA; discharge summary reviewed. Now reports constant and very painful lower R side/chest pain, esp with deep breaths. Denies: fever and difficulty breathing. Reports taking medications as directed. Normal PO intake without n/v/d.  OBJECTIVE:  Vitals:   04/19/22 1330  BP: 130/85  Pulse: 86  Resp: 20  Temp: 98.6 F (37 C)  TempSrc: Oral  SpO2: 100%    General appearance: alert; no distress; appears uncomfortable Eyes: PERRLA; EOMI; conjunctiva normal HENT: Ava; AT; without nasal congestion Neck: supple  CV: RRR Chest: very TTP over R lower ribs/side; no skin changes/bruising/signs  of trauma Lungs: speaks full sentences without difficulty; unlabored Extremities: no edema Skin: warm and dry Neurologic: normal gait Psychological: alert and cooperative; normal mood and affect  Imaging: DG Chest 2 View  Result Date: 04/19/2022 CLINICAL DATA:  Right lower chest pain.  Sarcoidosis. EXAM: CHEST - 2 VIEW COMPARISON:  CT scan 04/10/2022 FINDINGS: Atherosclerotic calcification of the aortic arch. Borderline enlargement of the cardiopericardial silhouette, without edema. Suspected right perihilar scarring in the upper lobe with hazy density in this region corresponding to the density shown on chest CT of 04/10/2022. Slight blunting of the right lateral costophrenic angle, cannot exclude a trace right pleural effusion. Degenerative glenohumeral spurring bilaterally. IMPRESSION: 1. Stable hazy nonspecific opacity in the right upper lobe, primarily reticular in nature. 2. Trace blunting of the right lateral costophrenic angle, cannot exclude a small right pleural effusion although no effusion was seen on 04/10/2022. 3. Aortic Atherosclerosis (ICD10-I70.0). 4. Borderline cardiomegaly. 5. Thoracic spondylosis and bilateral degenerative glenohumeral arthropathy. Electronically Signed   By: Gaylyn Rong M.D.   On: 04/19/2022 14:15    Allergies  Allergen Reactions   Ivp Dye [Iodinated Contrast Media] Anaphylaxis and Swelling   Voltaren [Diclofenac Sodium] Other (See Comments)    Pt states had bad state in mouth, mouth and tongue turned black, and then pt vomited thick white mucous.    Past Medical History:  Diagnosis Date   AKI (acute kidney injury) (HCC) 09/15/2015   Anemia    Arthritis    "knees" (02/20/2018)  Asthma    Bilateral swelling of feet    Constipation    Daily headache    Diarrhea 09/15/2015   Hiatal hernia    History of blood transfusion 1979   w/childbirth   Hypertension    Lactose intolerance    Osteoarthritis    Pulmonary embolism (HCC) 1990s X 1    Rheumatoid arthritis (HCC)    Sarcoidosis of lung (HCC)    SOB (shortness of breath)    Social History   Socioeconomic History   Marital status: Widowed    Spouse name: Not on file   Number of children: 3   Years of education: Not on file   Highest education level: Not on file  Occupational History   Occupation: CNA  Tobacco Use   Smoking status: Never   Smokeless tobacco: Never  Vaping Use   Vaping Use: Never used  Substance and Sexual Activity   Alcohol use: No   Drug use: No   Sexual activity: Not Currently  Other Topics Concern   Not on file  Social History Narrative   Not on file   Social Determinants of Health   Financial Resource Strain: Not on file  Food Insecurity: No Food Insecurity (04/11/2022)   Hunger Vital Sign    Worried About Running Out of Food in the Last Year: Never true    Ran Out of Food in the Last Year: Never true  Transportation Needs: No Transportation Needs (04/11/2022)   PRAPARE - Hydrologist (Medical): No    Lack of Transportation (Non-Medical): No  Physical Activity: Not on file  Stress: Not on file  Social Connections: Not on file  Intimate Partner Violence: Not At Risk (04/11/2022)   Humiliation, Afraid, Rape, and Kick questionnaire    Fear of Current or Ex-Partner: No    Emotionally Abused: No    Physically Abused: No    Sexually Abused: No   Family History  Problem Relation Age of Onset   Diabetes Mother    Kidney disease Mother    Hyperlipidemia Father    Hypertension Father    Heart attack Father    Heart disease Father    Sudden death Father    Alcoholism Father    Breast cancer Sister    Kidney cancer Sister    Diabetes Brother    Stroke Paternal Grandmother    Past Surgical History:  Procedure Laterality Date   ANKLE FRACTURE SURGERY Right    KNEE ARTHROSCOPY Right 1990s   [place] Left 2001     Vanessa Kick,  MD 04/21/22 1103

## 2022-06-29 ENCOUNTER — Ambulatory Visit (INDEPENDENT_AMBULATORY_CARE_PROVIDER_SITE_OTHER): Payer: Medicare HMO

## 2022-06-29 ENCOUNTER — Encounter (HOSPITAL_COMMUNITY): Payer: Self-pay

## 2022-06-29 ENCOUNTER — Ambulatory Visit (HOSPITAL_COMMUNITY)
Admission: EM | Admit: 2022-06-29 | Discharge: 2022-06-29 | Disposition: A | Discharge status: Home / Self Care | Payer: Medicare HMO | Attending: Internal Medicine | Admitting: Internal Medicine

## 2022-06-29 DIAGNOSIS — S161XXA Strain of muscle, fascia and tendon at neck level, initial encounter: Secondary | ICD-10-CM | POA: Diagnosis not present

## 2022-06-29 DIAGNOSIS — M542 Cervicalgia: Secondary | ICD-10-CM

## 2022-06-29 DIAGNOSIS — S0003XA Contusion of scalp, initial encounter: Secondary | ICD-10-CM

## 2022-06-29 MED ORDER — TRAMADOL HCL 50 MG PO TABS: 50.0000 mg | ORAL_TABLET | Freq: Four times a day (QID) | ORAL | 0 refills | Status: DC | PRN

## 2022-06-29 MED ORDER — BACLOFEN 10 MG PO TABS: 10.0000 mg | ORAL_TABLET | Freq: Three times a day (TID) | ORAL | 0 refills | Status: DC

## 2022-06-29 MED ORDER — KETOROLAC TROMETHAMINE 30 MG/ML IJ SOLN: 30.0000 mg | Freq: Once | INTRAMUSCULAR | 0 refills | Status: DC

## 2022-06-29 MED ORDER — KETOROLAC TROMETHAMINE 30 MG/ML IJ SOLN
INTRAMUSCULAR | Status: AC
  Filled 2022-06-29: qty 1

## 2022-06-29 MED ORDER — KETOROLAC TROMETHAMINE 30 MG/ML IJ SOLN
30.0000 mg | Freq: Once | INTRAMUSCULAR | Status: AC
  Administered 2022-06-29: 30 mg via INTRAMUSCULAR

## 2022-06-29 NOTE — Discharge Instructions (Signed)
Ice areas of pain today and tomorrow for 20 minutes at a time, then alternate with heat Follow up with your primary care doctor this week so you can be sent to physical therapy for improve healing.
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
## 2023-02-22 ENCOUNTER — Encounter (HOSPITAL_COMMUNITY): Payer: Self-pay

## 2023-02-22 ENCOUNTER — Ambulatory Visit (HOSPITAL_COMMUNITY)
Admission: EM | Admit: 2023-02-22 | Discharge: 2023-02-22 | Disposition: A | Discharge status: Home / Self Care | Payer: Medicare PPO | Attending: Family Medicine | Admitting: Family Medicine

## 2023-02-22 DIAGNOSIS — M25562 Pain in left knee: Secondary | ICD-10-CM

## 2023-02-22 DIAGNOSIS — M7122 Synovial cyst of popliteal space [Baker], left knee: Secondary | ICD-10-CM | POA: Diagnosis not present

## 2023-02-22 MED ORDER — IBUPROFEN 800 MG PO TABS
ORAL_TABLET | ORAL | Status: AC
  Filled 2023-02-22: qty 1

## 2023-02-22 MED ORDER — IBUPROFEN 800 MG PO TABS
800.0000 mg | ORAL_TABLET | Freq: Once | ORAL | Status: AC
  Administered 2023-02-22: 800 mg via ORAL

## 2023-02-22 MED ORDER — IBUPROFEN 600 MG PO TABS: 600.0000 mg | ORAL_TABLET | Freq: Three times a day (TID) | ORAL | 0 refills | Status: DC | PRN

## 2023-02-22 NOTE — Discharge Instructions (Signed)
We have given you 1 dose of ibuprofen 800 mg here in the clinic.  Take ibuprofen 600 mg--1 tab every 8 hours as needed for pain.

## 2023-02-22 NOTE — ED Triage Notes (Signed)
Pt presents with c/o knee pain x 2 days. Pt states she hit her knee against her car door. Pt states the back of her lt knee was swollen and red.   Has not taken anything for pain, has used a lidocaine patch that's not given relief.

## 2023-02-22 NOTE — ED Provider Notes (Signed)
[facility]    CSN: [id number] Arrival date & time: 02/22/23  1800      History   Chief Complaint Chief Complaint  Patient presents with   Knee Pain    HPI Michelle Rios is a 67 y.o. female.    Knee Pain Here for pain behind her left knee. Yesterday she bumped her left anterior knee on the car door.  Then later that evening it began hurting behind her knee.  It is painful and feels puffy to her behind her left knee.  She states her legs bilaterally are both always swollen somewhat.  She notes an intolerance to diclofenac because some black spots in the mouth.  Last renal function that was done in epic was in 2023 and showed a normal EGFR of greater than 60.  She is not taking any blood thinners  Past Medical History:  Diagnosis Date   AKI (acute kidney injury) (HCC) 09/15/2015   Anemia    Arthritis    "knees" (02/20/2018)   Asthma    Bilateral swelling of feet    Constipation    Daily headache    Diarrhea 09/15/2015   Hiatal hernia    History of blood transfusion 1979   w/childbirth   Hypertension    Lactose intolerance    Osteoarthritis    Pulmonary embolism (HCC) 1990s X 1   Rheumatoid arthritis (HCC)    Sarcoidosis of lung (HCC)    SOB (shortness of breath)     Patient Active Problem List   Diagnosis Date Noted   Right flank pain 04/20/2022   Pleural effusion, right 04/20/2022   History of sarcoidosis 04/20/2022   Lymphadenopathy 04/20/2022   Elevated liver enzymes 04/20/2022   Thyroid nodule 04/13/2022   CAP (community acquired pneumonia) 04/11/2022   Acute respiratory failure with hypoxia (HCC) 04/11/2022   Vitamin D deficiency 04/11/2022   Right-sided chest pain, pleuritic    Abdominal pain 02/20/2018   Diarrhea 02/20/2018   Unexplained night sweats 02/01/2018   Rash 02/01/2018   Anemia of chronic disease 12/06/2017   HLD (hyperlipidemia) 12/06/2017   Acute reaction to stress 11/12/2017   Pain in right knee 09/23/2017    Essential hypertension 02/09/2017   Obesity, Class III, BMI 40-49.9 (morbid obesity) (HCC) 02/09/2017   Sarcoidosis of lung (HCC) 02/09/2017   Right upper quadrant abdominal tenderness    SOB (shortness of breath)    AKI (acute kidney injury) (HCC) 09/15/2015    Past Surgical History:  Procedure Laterality Date   ANKLE FRACTURE SURGERY Right    KNEE ARTHROSCOPY Right 1990s   TUBAL LIGATION  1990s   VAGINAL HYSTERECTOMY  1990s   WRIST FRACTURE SURGERY Left 2001    OB History     Gravida  3   Para      Term      Preterm      AB      Living         SAB      IAB      Ectopic      Multiple      Live Births               Home Medications    Prior to Admission medications   Medication Sig Start Date End Date Taking? Authorizing Provider  ibuprofen (ADVIL) 600 MG tablet Take 1 tablet (600 mg total) by mouth every 8 (eight) hours as needed (pain). 02/22/23  Yes Zenia Resides, MD  acetaminophen (  TYLENOL) 325 MG tablet Take 650 mg by mouth as needed for mild pain or moderate pain.    [provider]  polyethylene glycol (MIRALAX) 17 g packet Take 17 g by mouth daily as needed. 04/21/22   Calvert Cantor, MD  Vitamin D, Ergocalciferol, (DRISDOL) 1.25 MG (50000 UNIT) CAPS capsule Take 1 capsule (50,000 Units total) by mouth every 7 (seven) days. 08/18/21   Langston Reusing, MD    Family History Family History  Problem Relation Age of Onset   Diabetes Mother    Kidney disease Mother    Hyperlipidemia Father    Hypertension Father    Heart attack Father    Heart disease Father    Sudden death Father    Alcoholism Father    Breast cancer Sister    Kidney cancer Sister    Diabetes Brother    Stroke Paternal Grandmother     Social History Social History   Tobacco Use   Smoking status: Never   Smokeless tobacco: Never  Vaping Use   Vaping status: Never Used  Substance Use Topics   Alcohol use: No   Drug use: No     Allergies   Ivp  dye [iodinated contrast media] and Voltaren [diclofenac sodium]   Review of Systems Review of Systems   Physical Exam Triage Vital Signs ED Triage Vitals  Encounter Vitals Group     BP 02/22/23 1826 105/65     Systolic BP Percentile --      Diastolic BP Percentile --      Pulse Rate 02/22/23 1824 (!) 103     Resp 02/22/23 1824 17     Temp 02/22/23 1824 98.5 F (36.9 C)     Temp Source 02/22/23 1824 Oral     SpO2 02/22/23 1824 95 %     Weight --      Height --      Head Circumference --      Peak Flow --      Pain Score 02/22/23 1824 10     Pain Loc --      Pain Education --      Exclude from Growth Chart --    No data found.  Updated Vital Signs BP 105/65   Pulse (!) 103   Temp 98.5 F (36.9 C) (Oral)   Resp 17   SpO2 95%   Visual Acuity Right Eye Distance:   Left Eye Distance:   Bilateral Distance:    Right Eye Near:   Left Eye Near:    Bilateral Near:     Physical Exam Vitals reviewed.  Constitutional:      General: She is not in acute distress.    Appearance: She is not ill-appearing, toxic-appearing or diaphoretic.  Musculoskeletal:     Comments: There is trace edema of both lower legs.  There is no tenderness or effusion of the left anterior knee range of motion is normal.  There is tenderness of the popliteal area on the left.  There is possible cystic enlargement, but I cannot tell that it is fat different from her right popliteal fossa.  There is no Homans and calf is not tender.  There is no erythema of the leg  Skin:    Coloration: Skin is not pale.  Neurological:     General: No focal deficit present.     Mental Status: She is alert and oriented to person, place, and time.  Psychiatric:        Behavior:  Behavior normal.      UC Treatments / Results  Labs (all labs ordered are listed, but only abnormal results are displayed) Labs Reviewed - No data to display  EKG   Radiology No results found.  Procedures Procedures (including  critical care time)  Medications Ordered in UC Medications  ibuprofen (ADVIL) tablet 800 mg (has no administration in time range)    Initial Impression / Assessment and Plan / UC Course  I have reviewed the triage vital signs and the nursing notes.  Pertinent labs & imaging results that were available during my care of the patient were reviewed by me and considered in my medical decision making (see chart for details).        Patient had tried lidocaine patch which was ineffective.  I decided against Toradol as that is probably too closely related to the Voltaren that she has had trouble with  Ibuprofen 600 mg is sent in for the pain.  I have asked her to follow-up with her primary care.   She cannot get to the pharmacy this evening, so we are administering and ibuprofen 800 mg here in the office w Final Clinical Impressions(s) / UC Diagnoses   Final diagnoses:  Acute pain of left knee  Baker's cyst of knee, left     Discharge Instructions      We have given you 1 dose of ibuprofen 800 mg here in the clinic.  Take ibuprofen 600 mg--1 tab every 8 hours as needed for pain.        ED Prescriptions     Medication Sig Dispense Auth. Provider   ibuprofen (ADVIL) 600 MG tablet Take 1 tablet (600 mg total) by mouth every 8 (eight) hours as needed (pain). 15 tablet Zamari Bonsall, Janace Aris, MD      I have reviewed the PDMP during this encounter.   Zenia Resides, MD 02/22/23 [phone]

## 2023-08-27 ENCOUNTER — Emergency Department
Admission: EM | Admit: 2023-08-27 | Discharge: 2023-08-27 | Disposition: A | Discharge status: Home / Self Care | Payer: Medicare PPO | Attending: Emergency Medicine | Admitting: Emergency Medicine

## 2023-08-27 ENCOUNTER — Emergency Department: Payer: Medicare PPO

## 2023-08-27 ENCOUNTER — Other Ambulatory Visit: Payer: Self-pay

## 2023-08-27 DIAGNOSIS — R110 Nausea: Secondary | ICD-10-CM | POA: Diagnosis present

## 2023-08-27 DIAGNOSIS — I700 Atherosclerosis of aorta: Secondary | ICD-10-CM | POA: Diagnosis not present

## 2023-08-27 DIAGNOSIS — R112 Nausea with vomiting, unspecified: Secondary | ICD-10-CM | POA: Insufficient documentation

## 2023-08-27 DIAGNOSIS — R42 Dizziness and giddiness: Secondary | ICD-10-CM | POA: Insufficient documentation

## 2023-08-27 DIAGNOSIS — Z20822 Contact with and (suspected) exposure to covid-19: Secondary | ICD-10-CM | POA: Insufficient documentation

## 2023-08-27 DIAGNOSIS — K529 Noninfective gastroenteritis and colitis, unspecified: Secondary | ICD-10-CM | POA: Insufficient documentation

## 2023-08-27 LAB — URINALYSIS, ROUTINE W REFLEX MICROSCOPIC
Bilirubin Urine: NEGATIVE
Glucose, UA: NEGATIVE mg/dL
Hgb urine dipstick: NEGATIVE
Ketones, ur: NEGATIVE mg/dL
Leukocytes,Ua: NEGATIVE
Nitrite: NEGATIVE
Protein, ur: 30 mg/dL — AB
Specific Gravity, Urine: 1.019 (ref 1.005–1.030)
pH: 5 (ref 5.0–8.0)

## 2023-08-27 LAB — COMPREHENSIVE METABOLIC PANEL
ALT: 16 U/L (ref 0–44)
AST: 27 U/L (ref 15–41)
Albumin: 4.2 g/dL (ref 3.5–5.0)
Alkaline Phosphatase: 75 U/L (ref 38–126)
Anion gap: 11 (ref 5–15)
BUN: 15 mg/dL (ref 8–23)
CO2: 24 mmol/L (ref 22–32)
Calcium: 9.2 mg/dL (ref 8.9–10.3)
Chloride: 105 mmol/L (ref 98–111)
Creatinine, Ser: 0.83 mg/dL (ref 0.44–1.00)
GFR, Estimated: >60 mL/min (ref >60)
Glucose, Bld: 109 mg/dL — ABNORMAL HIGH (ref 70–99)
Potassium: 4.5 mmol/L (ref 3.5–5.1)
Sodium: 140 mmol/L (ref 135–145)
Total Bilirubin: 1.1 mg/dL (ref 0.0–1.2)
Total Protein: 8.1 g/dL (ref 6.5–8.1)

## 2023-08-27 LAB — CBC WITH DIFFERENTIAL/PLATELET
Abs Immature Granulocytes: 0.02 10*3/uL (ref 0.00–0.07)
Basophils Absolute: 0 10*3/uL (ref 0.0–0.1)
Basophils Relative: 0 %
Eosinophils Absolute: 0.1 10*3/uL (ref 0.0–0.5)
Eosinophils Relative: 1 %
HCT: 38.1 % (ref 36.0–46.0)
Hemoglobin: 12.5 g/dL (ref 12.0–15.0)
Immature Granulocytes: 0 %
Lymphocytes Relative: 7 %
Lymphs Abs: 0.5 10*3/uL — ABNORMAL LOW (ref 0.7–4.0)
MCH: 33.4 pg (ref 26.0–34.0)
MCHC: 32.8 g/dL (ref 30.0–36.0)
MCV: 101.9 fL — ABNORMAL HIGH (ref 80.0–100.0)
Monocytes Absolute: 0.3 10*3/uL (ref 0.1–1.0)
Monocytes Relative: 4 %
Neutro Abs: 7 10*3/uL (ref 1.7–7.7)
Neutrophils Relative %: 88 %
Platelets: 296 10*3/uL (ref 150–400)
RBC: 3.74 MIL/uL — ABNORMAL LOW (ref 3.87–5.11)
RDW: 11.5 % (ref 11.5–15.5)
WBC: 7.9 10*3/uL (ref 4.0–10.5)
nRBC: 0 % (ref 0.0–0.2)

## 2023-08-27 LAB — RESP PANEL BY RT-PCR (RSV, FLU A&B, COVID)  RVPGX2
Influenza A by PCR: NEGATIVE
Influenza B by PCR: NEGATIVE
Resp Syncytial Virus by PCR: NEGATIVE
SARS Coronavirus 2 by RT PCR: NEGATIVE

## 2023-08-27 LAB — TROPONIN I (HIGH SENSITIVITY): Troponin I (High Sensitivity): 4 ng/L (ref <18)

## 2023-08-27 LAB — LIPASE, BLOOD: Lipase: 26 U/L (ref 11–51)

## 2023-08-27 MED ORDER — ONDANSETRON 4 MG PO TBDP: 4.0000 mg | ORAL_TABLET | Freq: Three times a day (TID) | ORAL | 0 refills | Status: AC | PRN

## 2023-08-27 MED ORDER — METOCLOPRAMIDE HCL 5 MG/ML IJ SOLN
10.0000 mg | Freq: Once | INTRAMUSCULAR | Status: AC
  Administered 2023-08-27: 10 mg via INTRAVENOUS
  Filled 2023-08-27: qty 2

## 2023-08-27 MED ORDER — KETOROLAC TROMETHAMINE 15 MG/ML IJ SOLN
15.0000 mg | Freq: Once | INTRAMUSCULAR | Status: AC
  Administered 2023-08-27: 15 mg via INTRAVENOUS
  Filled 2023-08-27: qty 1

## 2023-08-27 MED ORDER — ONDANSETRON HCL 4 MG/2ML IJ SOLN
4.0000 mg | Freq: Once | INTRAMUSCULAR | Status: AC
  Administered 2023-08-27: 4 mg via INTRAVENOUS
  Filled 2023-08-27: qty 2

## 2023-08-27 MED ORDER — SODIUM CHLORIDE 0.9 % IV BOLUS
1000.0000 mL | Freq: Once | INTRAVENOUS | Status: AC
  Administered 2023-08-27: 1000 mL via INTRAVENOUS

## 2023-08-27 NOTE — Discharge Instructions (Addendum)
You can get Imodium over-the-counter to help with diarrhea and Zofran that I prescribed to help with nausea, vomiting.  Try to stay well-hydrated with Pedialyte Gatorade without sugar.  Return to the ER for worsening symptoms or any other concerns but at this time I suspect this is more likely a viral illness such as norovirus.

## 2023-08-27 NOTE — ED Notes (Signed)
This RN asking pt triage questions pt started to vomit a couple times then went silent with a blank stare. Pt would not answer questions during this time, lasting almost 8 minutes, during which pt vomited 3 other times with minor amount of blood noted in mouth. Unsure of whether pt bit lip/tongue. Myah, PA, informed and came to check on pt, advised moving pt to a room.

## 2023-08-27 NOTE — ED Notes (Signed)
Pt states that nausea has somewhat improved--she was able to ambulate to bathroom without dizziness or syncopal episodes. She has been given ice chips to try

## 2023-08-27 NOTE — ED Provider Notes (Signed)
[facility] Provider Note    Event Date/Time   First MD Initiated Contact with Patient 08/27/23 1608     (approximate)   History   Nausea and Dizziness   HPI  Michelle Rios is a 68 y.o. female who comes in for diarrhea, vomiting, decreased p.o. intake.  She was starting to have some lower abdominal burning.  Patient reports that around 10 AM she started developing nausea vomiting diarrhea.  She denies any recent long travel recent antibiotics drinking any lake water.  She reports some lower abdominal cramping associate with the diarrhea.  Denies any black stools.  Reports that she just feels dehydrated which is making her feel lightheaded.  She states that her symptoms are just when she stands up she feels lightheaded.  She denies any headaches.  No falls.  She does report that a lot of people at her work are sick as well.   Physical Exam   Triage Vital Signs: ED Triage Vitals  Encounter Vitals Group     BP 08/27/23 1548 117/71     Systolic BP Percentile --      Diastolic BP Percentile --      Pulse Rate 08/27/23 1548 80     Resp 08/27/23 1548 16     Temp 08/27/23 1548 98.3 F (36.8 C)     Temp Source 08/27/23 1548 Oral     SpO2 08/27/23 1548 99 %     Weight 08/27/23 1553 237 lb (107.5 kg)     Height 08/27/23 1553 4\' 1"  (1.245 m)     Head Circumference --      Peak Flow --      Pain Score 08/27/23 1552 10     Pain Loc --      Pain Education --      Exclude from Growth Chart --     Most recent vital signs: Vitals:   08/27/23 1548  BP: 117/71  Pulse: 80  Resp: 16  Temp: 98.3 F (36.8 C)  SpO2: 99%     General: Awake, no distress.  CV:  Good peripheral perfusion.  Resp:  Normal effort.  Abd:  No distention. Tender with palpation  Other:  CN nerves 2 through 12 are intact.  Equal strength in arms and legs.  Finger-to-nose intact bilaterally   ED Results / Procedures / Treatments   Labs (all labs ordered are listed, but only  abnormal results are displayed) Labs Reviewed  CBC WITH DIFFERENTIAL/PLATELET - Abnormal; Notable for the following components:      Result Value   RBC 3.74 (*)    MCV 101.9 (*)    Lymphs Abs 0.5 (*)    All other components within normal limits  COMPREHENSIVE METABOLIC PANEL - Abnormal; Notable for the following components:   Glucose, Bld 109 (*)    All other components within normal limits  URINALYSIS, ROUTINE W REFLEX MICROSCOPIC - Abnormal; Notable for the following components:   Color, Urine YELLOW (*)    APPearance HAZY (*)    Protein, ur 30 (*)    Bacteria, UA FEW (*)    All other components within normal limits  RESP PANEL BY RT-PCR (RSV, FLU A&B, COVID)  RVPGX2  LIPASE, BLOOD  TROPONIN I (HIGH SENSITIVITY)  TROPONIN I (HIGH SENSITIVITY)     EKG  My interpretation of EKG:  Normal sinus rate of 77 any ST elevation or T wave inversions, normal intervals  RADIOLOGY I have reviewed the CT personally  and interpreted and no kindey stone  PROCEDURES:  Critical Care performed: No  Procedures   MEDICATIONS ORDERED IN ED: Medications - No data to display   IMPRESSION / MDM / ASSESSMENT AND PLAN / ED COURSE  I reviewed the triage vital signs and the nursing notes.   Patient's presentation is most consistent with acute presentation with potential threat to life or bodily function.   Patient comes in with nausea vomiting diarrhea sounds most likely viral in nature.  Patient was given symptomatic treatment with Toradol, Zofran, fluids.  Repeat evaluation she continues to have some tenderness on examination where she pushes my hand away given his concern for significantly tenderness I will get CT imaging to evaluate for other acute pathology after discussion with patient.  I do not feel a CT head is necessary she denies any headaches her neuroexam is normal and she reports only lightheadedness when she stands up so I suspect this is just related to dehydration.  UA without  evidence of UTI.  COVID, flu are negative.  CBC shows normal white count.  CMP is reassuring lipase normal troponin negative patient had no chest pain only dizziness that seems more orthostatic in nature.  Cranial nerves are intact no evidence of stroke.  CT imaging negative.  Repeat evaluation patient reports feeling better will do p.o. challenge and patient  feels comfortable with discharge     FINAL CLINICAL IMPRESSION(S) / ED DIAGNOSES   Final diagnoses:  Gastroenteritis     Rx / DC Orders   ED Discharge Orders          Ordered    ondansetron (ZOFRAN-ODT) 4 MG disintegrating tablet  Every 8 hours PRN        08/27/23 1832             Note:  This document was prepared using Dragon voice recognition software and may include unintentional dictation errors.   Concha Se, MD 08/27/23 [phone]

## 2023-08-27 NOTE — ED Notes (Signed)
Gave pt ice to work on earlier, no further episodes of emesis-pt does state she feels better.  Awaiting IVF completion before d/c

## 2023-08-27 NOTE — ED Notes (Signed)
First nurse note: AEMS from home, CC NVD since this morning. Vomited "12-13 times" since this morning but none with EMS Had covid last week HR 85, SPO2 100% RA, 179/94 and hx HTN, T 98.8

## 2023-08-27 NOTE — ED Triage Notes (Signed)
Pt BIB EMS from work for diarrhea, vomiting, and no appetite. Started at 10am this morning. Pt stating she is having lower abd pain feels like it is burning. Pt last time haivng emesis was about 1500. Pt states has not been able to keep anything down today.

## 2023-08-30 ENCOUNTER — Other Ambulatory Visit: Payer: Self-pay

## 2023-08-30 ENCOUNTER — Emergency Department (HOSPITAL_COMMUNITY)
Admission: EM | Admit: 2023-08-30 | Discharge: 2023-08-30 | Disposition: A | Discharge status: Home / Self Care | Payer: Medicare PPO | Attending: Emergency Medicine | Admitting: Emergency Medicine

## 2023-08-30 ENCOUNTER — Encounter (HOSPITAL_COMMUNITY): Payer: Self-pay

## 2023-08-30 DIAGNOSIS — I10 Essential (primary) hypertension: Secondary | ICD-10-CM | POA: Insufficient documentation

## 2023-08-30 DIAGNOSIS — R197 Diarrhea, unspecified: Secondary | ICD-10-CM | POA: Diagnosis present

## 2023-08-30 DIAGNOSIS — J45909 Unspecified asthma, uncomplicated: Secondary | ICD-10-CM | POA: Insufficient documentation

## 2023-08-30 DIAGNOSIS — K2900 Acute gastritis without bleeding: Secondary | ICD-10-CM | POA: Diagnosis not present

## 2023-08-30 DIAGNOSIS — R112 Nausea with vomiting, unspecified: Secondary | ICD-10-CM

## 2023-08-30 DIAGNOSIS — Z79899 Other long term (current) drug therapy: Secondary | ICD-10-CM | POA: Insufficient documentation

## 2023-08-30 LAB — URINALYSIS, ROUTINE W REFLEX MICROSCOPIC
Bilirubin Urine: NEGATIVE
Glucose, UA: NEGATIVE mg/dL
Hgb urine dipstick: NEGATIVE
Ketones, ur: NEGATIVE mg/dL
Nitrite: NEGATIVE
Protein, ur: 100 mg/dL — AB
Specific Gravity, Urine: 1.026 (ref 1.005–1.030)
pH: 5 (ref 5.0–8.0)

## 2023-08-30 LAB — COMPREHENSIVE METABOLIC PANEL
ALT: 16 U/L (ref 0–44)
AST: 29 U/L (ref 15–41)
Albumin: 3.6 g/dL (ref 3.5–5.0)
Alkaline Phosphatase: 61 U/L (ref 38–126)
Anion gap: 12 (ref 5–15)
BUN: 10 mg/dL (ref 8–23)
CO2: 22 mmol/L (ref 22–32)
Calcium: 9.1 mg/dL (ref 8.9–10.3)
Chloride: 105 mmol/L (ref 98–111)
Creatinine, Ser: 1.07 mg/dL — ABNORMAL HIGH (ref 0.44–1.00)
GFR, Estimated: 57 mL/min — ABNORMAL LOW (ref >60)
Glucose, Bld: 112 mg/dL — ABNORMAL HIGH (ref 70–99)
Potassium: 3.7 mmol/L (ref 3.5–5.1)
Sodium: 139 mmol/L (ref 135–145)
Total Bilirubin: 0.7 mg/dL (ref 0.0–1.2)
Total Protein: 7.3 g/dL (ref 6.5–8.1)

## 2023-08-30 LAB — LIPASE, BLOOD: Lipase: 29 U/L (ref 11–51)

## 2023-08-30 LAB — CBC
HCT: 37.4 % (ref 36.0–46.0)
Hemoglobin: 12.5 g/dL (ref 12.0–15.0)
MCH: 32.8 pg (ref 26.0–34.0)
MCHC: 33.4 g/dL (ref 30.0–36.0)
MCV: 98.2 fL (ref 80.0–100.0)
Platelets: 294 10*3/uL (ref 150–400)
RBC: 3.81 MIL/uL — ABNORMAL LOW (ref 3.87–5.11)
RDW: 11.3 % — ABNORMAL LOW (ref 11.5–15.5)
WBC: 4.6 10*3/uL (ref 4.0–10.5)
nRBC: 0 % (ref 0.0–0.2)

## 2023-08-30 MED ORDER — ONDANSETRON HCL 4 MG/2ML IJ SOLN
4.0000 mg | Freq: Once | INTRAMUSCULAR | Status: AC
  Administered 2023-08-30: 4 mg via INTRAVENOUS
  Filled 2023-08-30: qty 2

## 2023-08-30 MED ORDER — ALUM & MAG HYDROXIDE-SIMETH 200-200-20 MG/5ML PO SUSP
30.0000 mL | Freq: Once | ORAL | Status: AC
  Administered 2023-08-30: 30 mL via ORAL
  Filled 2023-08-30: qty 30

## 2023-08-30 MED ORDER — PROMETHAZINE HCL 25 MG PO TABS: 25.0000 mg | ORAL_TABLET | Freq: Four times a day (QID) | ORAL | 0 refills | Status: DC | PRN

## 2023-08-30 MED ORDER — OXYCODONE HCL 5 MG PO TABS
5.0000 mg | ORAL_TABLET | Freq: Once | ORAL | Status: AC
  Administered 2023-08-30: 5 mg via ORAL
  Filled 2023-08-30: qty 1

## 2023-08-30 MED ORDER — PANTOPRAZOLE SODIUM 40 MG IV SOLR
80.0000 mg | Freq: Once | INTRAVENOUS | Status: AC
  Administered 2023-08-30: 80 mg via INTRAVENOUS
  Filled 2023-08-30: qty 20

## 2023-08-30 MED ORDER — LACTATED RINGERS IV BOLUS
1000.0000 mL | Freq: Once | INTRAVENOUS | Status: AC
  Administered 2023-08-30: 1000 mL via INTRAVENOUS

## 2023-08-30 MED ORDER — DICYCLOMINE HCL 20 MG PO TABS: 20.0000 mg | ORAL_TABLET | Freq: Two times a day (BID) | ORAL | 0 refills | Status: DC

## 2023-08-30 MED ORDER — PANTOPRAZOLE SODIUM 20 MG PO TBEC: 40.0000 mg | DELAYED_RELEASE_TABLET | Freq: Every day | ORAL | 0 refills | Status: DC

## 2023-08-30 NOTE — ED Triage Notes (Signed)
Pt here for abd pain that started Saturday. N/v/d. Pt states she hasn't been able to eat/drink and has had 20 loose stools. Denies fevers.

## 2023-08-30 NOTE — ED Provider Notes (Signed)
 [place] EMERGENCY DEPARTMENT AT [facility] Provider Note   CSN: [id number] Arrival date & time: 08/30/23  9072     History  Chief Complaint  Patient presents with   Nausea   Abdominal Pain    Michelle Rios is a 68 y.o. female.  HPI     68 year old female with a history of pulmonary embolus, rheumatoid arthritis, sarcoidosis, recent evaluation at [facility] for abdominal pain and diarrhea who presents with concern for diarrhea.    While she was at [facility] on February 1 had testing including CBC, CMP, lipase, troponin, respiratory panel, and urinalysis which did not show clinically significant findings.  She had a CT abdomen pelvis which showed no acute findings.  Reports her symptoms have continued.  Reports yesterday she had approximately 20 watery bowel movements.  Reports it was nonstop.  Today since she is not been eating anything she has not had as much diarrhea.  Reports having 2 episodes of diarrhea and 2 episodes of vomiting.  Denies urinary symptoms.  She has pain in the epigastric area that is worse when she eats.  The pain is severe.  Denies tarry stools. Past Medical History:  Diagnosis Date   AKI (acute kidney injury) (HCC) 09/15/2015   Anemia    Arthritis    knees (02/20/2018)   Asthma    Bilateral swelling of feet    Constipation    Daily headache    Diarrhea 09/15/2015   Hiatal hernia    History of blood transfusion 1979   w/childbirth   Hypertension    Lactose intolerance    Osteoarthritis    Pulmonary embolism (HCC) 1990s X 1   Rheumatoid arthritis (HCC)    Sarcoidosis of lung (HCC)    SOB (shortness of breath)     Past Surgical History:  Procedure Laterality Date   ANKLE FRACTURE SURGERY Right    KNEE ARTHROSCOPY Right 1990s   TUBAL LIGATION  1990s   VAGINAL HYSTERECTOMY  1990s   WRIST FRACTURE SURGERY Left 2001    Home Medications Prior to Admission medications   Medication Sig Start Date End  Date Taking? Authorizing Provider  dicyclomine  (BENTYL ) 20 MG tablet Take 1 tablet (20 mg total) by mouth 2 (two) times daily. 08/30/23  Yes Dreama Longs, MD  pantoprazole  (PROTONIX ) 20 MG tablet Take 2 tablets (40 mg total) by mouth daily for 14 days. 08/30/23 09/13/23 Yes Dreama Longs, MD  promethazine  (PHENERGAN ) 25 MG tablet Take 1 tablet (25 mg total) by mouth every 6 (six) hours as needed for nausea or vomiting. 08/30/23  Yes Dreama Longs, MD  acetaminophen  (TYLENOL ) 325 MG tablet Take 650 mg by mouth as needed for mild pain or moderate pain.    [provider]  ibuprofen  (ADVIL ) 600 MG tablet Take 1 tablet (600 mg total) by mouth every 8 (eight) hours as needed (pain). 02/22/23   Vonna Sharlet POUR, MD  ondansetron  (ZOFRAN -ODT) 4 MG disintegrating tablet Take 1 tablet (4 mg total) by mouth every 8 (eight) hours as needed for up to 5 days. 08/27/23 09/01/23  Ernest Ronal BRAVO, MD  polyethylene glycol (MIRALAX ) 17 g packet Take 17 g by mouth daily as needed. 04/21/22   Rizwan, Saima, MD  Vitamin D , Ergocalciferol , (DRISDOL ) 1.25 MG (50000 UNIT) CAPS capsule Take 1 capsule (50,000 Units total) by mouth every 7 (seven) days. 08/18/21   Berkeley Adelita PENNER, MD      Allergies    Ivp dye [iodinated contrast  media] and Voltaren  [diclofenac  sodium]    Review of Systems   Review of Systems  Physical Exam Updated Vital Signs BP (!) 99/57 (BP Location: Right Arm)   Pulse 81   Temp 98.5 F (36.9 C) (Oral)   Resp 18   Ht 5' 1 (1.549 m)   Wt 111.1 kg   SpO2 98%   BMI 46.29 kg/m  Physical Exam Vitals and nursing note reviewed.  Constitutional:      General: She is not in acute distress.    Appearance: She is well-developed. She is not diaphoretic.  HENT:     Head: Normocephalic and atraumatic.  Eyes:     Conjunctiva/sclera: Conjunctivae normal.  Cardiovascular:     Rate and Rhythm: Normal rate and regular rhythm.     Heart sounds: Normal heart sounds. No murmur heard.    No  friction rub. No gallop.  Pulmonary:     Effort: Pulmonary effort is normal. No respiratory distress.     Breath sounds: Normal breath sounds. No wheezing or rales.  Abdominal:     General: There is no distension.     Palpations: Abdomen is soft.     Tenderness: There is abdominal tenderness in the epigastric area. There is no guarding.  Musculoskeletal:        General: No tenderness.     Cervical back: Normal range of motion.  Skin:    General: Skin is warm and dry.     Findings: No erythema or rash.  Neurological:     Mental Status: She is alert and oriented to person, place, and time.     ED Results / Procedures / Treatments   Labs (all labs ordered are listed, but only abnormal results are displayed) Labs Reviewed  COMPREHENSIVE METABOLIC PANEL - Abnormal; Notable for the following components:      Result Value   Glucose, Bld 112 (*)    Creatinine, Ser 1.07 (*)    GFR, Estimated 57 (*)    All other components within normal limits  CBC - Abnormal; Notable for the following components:   RBC 3.81 (*)    RDW 11.3 (*)    All other components within normal limits  URINALYSIS, ROUTINE W REFLEX MICROSCOPIC - Abnormal; Notable for the following components:   Color, Urine AMBER (*)    APPearance CLOUDY (*)    Protein, ur 100 (*)    Leukocytes,Ua TRACE (*)    Bacteria, UA MANY (*)    All other components within normal limits  LIPASE, BLOOD    EKG None  Radiology No results found.  Procedures Procedures    Medications Ordered in ED Medications  lactated ringers  bolus 1,000 mL (0 mLs Intravenous Stopped 08/30/23 1908)  ondansetron  (ZOFRAN ) injection 4 mg (4 mg Intravenous Given 08/30/23 1618)  pantoprazole  (PROTONIX ) injection 80 mg (80 mg Intravenous Given 08/30/23 2139)  alum & mag hydroxide-simeth (MAALOX/MYLANTA) 200-200-20 MG/5ML suspension 30 mL (30 mLs Oral Given 08/30/23 2138)  oxyCODONE  (Oxy IR/ROXICODONE ) immediate release tablet 5 mg (5 mg Oral Given 08/30/23 2138)     ED Course/ Medical Decision Making/ A&P                                  68 year old female with a history of pulmonary embolus, rheumatoid arthritis, sarcoidosis, recent evaluation at [facility] for abdominal pain and diarrhea who presents with concern for diarrhea.  DDx includes appendicitis,  pancreatitis, cholecystitis, pyelonephritis, nephrolithiasis, diverticulitis, SBO, gastroenteritis, colitis, perforation.   Labs completed today and personally evaluated interpreted by me show mild elevation in creatinine in comparison to prior with a creatinine of 1, increased from 0.83, no clinically significant electrolyte abnormalities, lipase within normal limits, normal transaminases, no anemia or leukocytosis. UA with 0-5 wbc, appears contaminated.  Reviewed recent CT which showed no acute abnormalities.  On exam has epigastric tenderness, no RUQ tenderness, and given history of severe diarrhea, this pain, suspect likely gastritis in setting of viral or bacterial gastroenteritis. She did not have diarrhea while in the ED for sampling. No recent abx and overall suspicion for cdiff is low. 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  Feels improved after fluids, nausea medicine, gi cocktail, PPI and pain medication.  Recommend continued supportive care.  Prescription sent for PPI, Bentyl , Phenergan  for nausea. Patient discharged in stable condition with understanding of reasons to return.           Final Clinical Impression(s) / ED Diagnoses Final diagnoses:  Nausea vomiting and diarrhea  Acute gastritis without hemorrhage, unspecified gastritis type    Rx / DC Orders ED Discharge Orders          Ordered    pantoprazole  (PROTONIX ) 20 MG tablet  Daily        08/30/23 2207    dicyclomine  (BENTYL ) 20 MG tablet  2 times daily        08/30/23 2207    promethazine  (PHENERGAN ) 25 MG tablet   Every 6 hours PRN        08/30/23 2207              Dreama Longs, MD 08/31/23 [phone]

## 2023-09-08 NOTE — Progress Notes (Signed)
Given illness pt testing for covid/flu

## 2023-11-01 ENCOUNTER — Ambulatory Visit (INDEPENDENT_AMBULATORY_CARE_PROVIDER_SITE_OTHER): Admitting: Family

## 2023-11-01 ENCOUNTER — Other Ambulatory Visit (INDEPENDENT_AMBULATORY_CARE_PROVIDER_SITE_OTHER): Payer: Self-pay

## 2023-11-01 ENCOUNTER — Encounter: Payer: Self-pay | Admitting: Family

## 2023-11-01 VITALS — Ht 61.0 in | Wt 239.0 lb

## 2023-11-01 DIAGNOSIS — M170 Bilateral primary osteoarthritis of knee: Secondary | ICD-10-CM

## 2023-11-01 DIAGNOSIS — M25561 Pain in right knee: Secondary | ICD-10-CM

## 2023-11-01 DIAGNOSIS — G8929 Other chronic pain: Secondary | ICD-10-CM | POA: Diagnosis not present

## 2023-11-01 DIAGNOSIS — M25562 Pain in left knee: Secondary | ICD-10-CM

## 2023-11-01 MED ORDER — NABUMETONE 500 MG PO TABS: 500.0000 mg | ORAL_TABLET | Freq: Every day | ORAL | 1 refills | Status: DC

## 2023-11-01 NOTE — Progress Notes (Signed)
 Office Visit Note   Patient: Michelle Rios           Date of Birth: 05-Feb-1956           MRN: [id number] Visit Date: 11/01/2023              Requested by: No referring provider defined for this encounter. PCP: Patient, No Pcp Per  Chief Complaint  Patient presents with   Left Knee - Pain   Right Knee - Pain      HPI: The patient is a 68 year old woman who presents today for initial evaluation of bilateral knee pain which is chronic.  This been going on for greater than 20 years she reports it is getting worse.  The left is more painful than the right she has had locking catching and giving way excruciating pain.  She has attempted weight loss has lost over 20 pounds has not seen any change in her knee pain.  She has previously been seen at Dr. Ardyth Gal office  She reports that she has had no relief with cortisone injections and thinks she has also had supplemental injections without relief  Reports having arthroscopy of the left knee in the 1990s. Assessment & Plan: Visit Diagnoses:  1. Bilateral primary osteoarthritis of knee   2. Chronic pain of both knees     Plan: Declined offer for referral to weight management clinic.  Have updated her BMI in the system today it is 45.  She is to be referred to physical therapy for prehab evaluation prior to total knee arthroplasty would like to have total knee arthroplasty of her left knee first.   Will need to have appointment with dr. Lajoyce Corners prior to TKA for authorization.  Follow-Up Instructions: Return in about 4 weeks (around 11/29/2023), or if symptoms worsen or fail to improve.   Right Knee Exam   Tenderness  The patient is experiencing tenderness in the medial joint line and lateral joint line.  Range of Motion  The patient has normal right knee ROM.  Tests  Varus: negative Valgus: negative  Other  Erythema: absent Effusion: no effusion present   Left Knee Exam   Tenderness  The patient is experiencing tenderness  in the lateral joint line and medial joint line.  Range of Motion  The patient has normal left knee ROM.  Tests  Varus: negative Valgus: negative  Other  Erythema: absent Effusion: no effusion present      Patient is alert, oriented, no adenopathy, well-dressed, normal affect, normal respiratory effort.   Imaging: No results found. No images are attached to the encounter.  Labs: Lab Results  Component Value Date   HGBA1C 5.1 08/05/2021   HGBA1C 5.2 02/09/2017   ESRSEDRATE 37 (H) 04/11/2022   ESRSEDRATE 45 (H) 12/20/2020   ESRSEDRATE 41 (H) 09/16/2015   CRP <0.5 12/20/2020   REPTSTATUS 04/16/2022 FINAL 04/11/2022   REPTSTATUS 04/17/2022 FINAL 04/11/2022   CULT  04/11/2022    NO GROWTH 5 DAYS Performed at [facility], [address] [place], [place] [address]    CULT (A) 04/11/2022    STAPHYLOCOCCUS EPIDERMIDIS THE SIGNIFICANCE OF ISOLATING THIS ORGANISM FROM A SINGLE SET OF BLOOD CULTURES WHEN MULTIPLE SETS ARE DRAWN IS UNCERTAIN. PLEASE NOTIFY THE MICROBIOLOGY DEPARTMENT WITHIN ONE WEEK IF SPECIATION AND SENSITIVITIES ARE REQUIRED. PROPIONIBACTERIUM ACNES Standardized susceptibility testing for this organism is not available. Performed at [facility], [address] [place], [place] [address]      Lab  Results  Component Value Date   ALBUMIN 3.6 08/30/2023   ALBUMIN 4.2 08/27/2023   ALBUMIN 3.4 (L) 04/21/2022    No results found for: "MG" Lab Results  Component Value Date   VD25OH 9.1 (L) 08/05/2021    No results found for: "PREALBUMIN"    Latest Ref Rng & Units 08/30/2023    9:35 AM 08/27/2023    4:17 PM 04/21/2022    6:01 AM  CBC EXTENDED  WBC 4.0 - 10.5 K/uL 4.6  7.9  7.3   RBC 3.87 - 5.11 MIL/uL 3.81  3.74  2.95   Hemoglobin 12.0 - 15.0 g/dL 96.2  95.2  9.9   HCT 84.1 - 46.0 % 37.4  38.1  29.2   Platelets 150 - 400 K/uL 294  296  281   NEUT# 1.7 - 7.7 K/uL  7.0    Lymph# 0.7 - 4.0 K/uL  0.5       Body mass index is 45.16  kg/m.  Orders:  Orders Placed This Encounter  Procedures   XR Knee 1-2 Views Right   XR Knee 1-2 Views Left   Ambulatory referral to Physical Therapy   No orders of the defined types were placed in this encounter.    Procedures: No procedures performed  Clinical Data: No additional findings.  ROS:  All other systems negative, except as noted in the HPI. Review of Systems  Objective: Vital Signs: Ht 5\' 1"  (1.549 m)   Wt 239 lb (108.4 kg)   BMI 45.16 kg/m   Specialty Comments:  No specialty comments available.  PMFS History: Patient Active Problem List   Diagnosis Date Noted   Right flank pain 04/20/2022   Pleural effusion, right 04/20/2022   History of sarcoidosis 04/20/2022   Lymphadenopathy 04/20/2022   Elevated liver enzymes 04/20/2022   Thyroid nodule 04/13/2022   CAP (community acquired pneumonia) 04/11/2022   Acute respiratory failure with hypoxia (HCC) 04/11/2022   Vitamin D deficiency 04/11/2022   Right-sided chest pain, pleuritic    Abdominal pain 02/20/2018   Diarrhea 02/20/2018   Unexplained night sweats 02/01/2018   Rash 02/01/2018   Anemia of chronic disease 12/06/2017   HLD (hyperlipidemia) 12/06/2017   Acute reaction to stress 11/12/2017   Pain in right knee 09/23/2017   Essential hypertension 02/09/2017   Obesity, Class III, BMI 40-49.9 (morbid obesity) (HCC) 02/09/2017   Sarcoidosis of lung (HCC) 02/09/2017   Right upper quadrant abdominal tenderness    SOB (shortness of breath)    AKI (acute kidney injury) (HCC) 09/15/2015   Past Medical History:  Diagnosis Date   AKI (acute kidney injury) (HCC) 09/15/2015   Anemia    Arthritis    "knees" (02/20/2018)   Asthma    Bilateral swelling of feet    Constipation    Daily headache    Diarrhea 09/15/2015   Hiatal hernia    History of blood transfusion 1979   w/childbirth   Hypertension    Lactose intolerance    Osteoarthritis    Pulmonary embolism (HCC) 1990s X 1   Rheumatoid  arthritis (HCC)    Sarcoidosis of lung (HCC)    SOB (shortness of breath)     Family History  Problem Relation Age of Onset   Diabetes Mother    Kidney disease Mother    Hyperlipidemia Father    Hypertension Father    Heart attack Father    Heart disease Father    Sudden death Father    Alcoholism Father  Breast cancer Sister    Kidney cancer Sister    Diabetes Brother    Stroke Paternal Grandmother     Past Surgical History:  Procedure Laterality Date   ANKLE FRACTURE SURGERY Right    KNEE ARTHROSCOPY Right 1990s   TUBAL LIGATION  1990s   VAGINAL HYSTERECTOMY  1990s   WRIST FRACTURE SURGERY Left 2001   Social History   Occupational History   Occupation: CNA  Tobacco Use   Smoking status: Never   Smokeless tobacco: Never  Vaping Use   Vaping status: Never Used  Substance and Sexual Activity   Alcohol use: No   Drug use: No   Sexual activity: Not Currently

## 2023-11-01 NOTE — Progress Notes (Deleted)
 Office Visit Note   Patient: Michelle Rios           Date of Birth: [birth year]           MRN: [id number] Visit Date: 11/01/2023              Requested by: No referring provider defined for this encounter. PCP: Patient, No Pcp Per   Assessment & Plan: Visit Diagnoses:  1. Bilateral primary osteoarthritis of knee   2. Chronic pain of both knees     Plan: ***  Follow-Up Instructions: Return in about 4 weeks (around 11/29/2023), or if symptoms worsen or fail to improve.   Orders:  Orders Placed This Encounter  Procedures  . XR Knee 1-2 Views Right  . XR Knee 1-2 Views Left  . Ambulatory referral to Physical Therapy   Meds ordered this encounter  Medications  . nabumetone (RELAFEN) 500 MG tablet    Sig: Take 1 tablet (500 mg total) by mouth daily.    Dispense:  60 tablet    Refill:  1      Procedures: No procedures performed   Clinical Data: No additional findings.   Subjective: Chief Complaint  Patient presents with  . Left Knee - Pain  . Right Knee - Pain    HPI  Review of Systems   Objective: Vital Signs: Ht 5\' 1"  (1.549 m)   Wt 239 lb (108.4 kg)   BMI 45.16 kg/m   Physical Exam  Ortho Exam  Specialty Comments:  No specialty comments available.  Imaging: XR Knee 1-2 Views Right Result Date: 11/01/2023 Radiographs of the right knee show severe degenerative changes in all 3 compartments.  Bone-on-bone contact.  XR Knee 1-2 Views Left Result Date: 11/01/2023 Radiographs of left knee show severe degenerative changes in all 3 compartments with bone-on-bone contact    PMFS History: Patient Active Problem List   Diagnosis Date Noted  . Right flank pain 04/20/2022  . Pleural effusion, right 04/20/2022  . History of sarcoidosis 04/20/2022  . Lymphadenopathy 04/20/2022  . Elevated liver enzymes 04/20/2022  . Thyroid nodule 04/13/2022  . CAP (community acquired pneumonia) 04/11/2022  . Acute respiratory failure with hypoxia (HCC) 04/11/2022   . Vitamin D deficiency 04/11/2022  . Right-sided chest pain, pleuritic   . Abdominal pain 02/20/2018  . Diarrhea 02/20/2018  . Unexplained night sweats 02/01/2018  . Rash 02/01/2018  . Anemia of chronic disease 12/06/2017  . HLD (hyperlipidemia) 12/06/2017  . Acute reaction to stress 11/12/2017  . Pain in right knee 09/23/2017  . Essential hypertension 02/09/2017  . Obesity, Class III, BMI 40-49.9 (morbid obesity) (HCC) 02/09/2017  . Sarcoidosis of lung (HCC) 02/09/2017  . Right upper quadrant abdominal tenderness   . SOB (shortness of breath)   . AKI (acute kidney injury) (HCC) 09/15/2015   Past Medical History:  Diagnosis Date  . AKI (acute kidney injury) (HCC) 09/15/2015  . Anemia   . Arthritis    "knees" (02/20/2018)  . Asthma   . Bilateral swelling of feet   . Constipation   . Daily headache   . Diarrhea 09/15/2015  . Hiatal hernia   . History of blood transfusion 1979   w/childbirth  . Hypertension   . Lactose intolerance   . Osteoarthritis   . Pulmonary embolism (HCC) 1990s X 1  . Rheumatoid arthritis (HCC)   . Sarcoidosis of lung (HCC)   . SOB (shortness of breath)     Family History  Problem Relation  Age of Onset  . Diabetes Mother   . Kidney disease Mother   . Hyperlipidemia Father   . Hypertension Father   . Heart attack Father   . Heart disease Father   . Sudden death Father   . Alcoholism Father   . Breast cancer Sister   . Kidney cancer Sister   . Diabetes Brother   . Stroke Paternal Grandmother     Past Surgical History:  Procedure Laterality Date  . ANKLE FRACTURE SURGERY Right   . KNEE ARTHROSCOPY Right 1990s  . TUBAL LIGATION  1990s  . VAGINAL HYSTERECTOMY  1990s  . WRIST FRACTURE SURGERY Left 2001   Social History   Occupational History  . Occupation: CNA  Tobacco Use  . Smoking status: Never  . Smokeless tobacco: Never  Vaping Use  . Vaping status: Never Used  Substance and Sexual Activity  . Alcohol use: No  . Drug use: No   . Sexual activity: Not Currently

## 2023-11-18 ENCOUNTER — Ambulatory Visit: Admitting: Physical Therapy

## 2023-12-02 ENCOUNTER — Ambulatory Visit: Admitting: Rehabilitative and Restorative Service Providers"

## 2023-12-21 ENCOUNTER — Other Ambulatory Visit: Payer: Self-pay

## 2023-12-21 ENCOUNTER — Emergency Department (HOSPITAL_COMMUNITY)

## 2023-12-21 ENCOUNTER — Encounter (HOSPITAL_COMMUNITY): Payer: Self-pay

## 2023-12-21 ENCOUNTER — Ambulatory Visit (HOSPITAL_COMMUNITY): Payer: Self-pay

## 2023-12-21 ENCOUNTER — Ambulatory Visit (HOSPITAL_COMMUNITY): Admission: EM | Admit: 2023-12-21 | Discharge: 2023-12-21 | Disposition: A | Discharge status: Home / Self Care

## 2023-12-21 ENCOUNTER — Observation Stay (HOSPITAL_COMMUNITY)
Admission: EM | Admit: 2023-12-21 | Discharge: 2023-12-23 | Disposition: A | Discharge status: Home / Self Care | Attending: Internal Medicine | Admitting: Internal Medicine

## 2023-12-21 ENCOUNTER — Ambulatory Visit (INDEPENDENT_AMBULATORY_CARE_PROVIDER_SITE_OTHER)

## 2023-12-21 DIAGNOSIS — I10 Essential (primary) hypertension: Secondary | ICD-10-CM | POA: Diagnosis not present

## 2023-12-21 DIAGNOSIS — R0600 Dyspnea, unspecified: Principal | ICD-10-CM | POA: Diagnosis present

## 2023-12-21 DIAGNOSIS — E66813 Obesity, class 3: Secondary | ICD-10-CM | POA: Diagnosis not present

## 2023-12-21 DIAGNOSIS — J45901 Unspecified asthma with (acute) exacerbation: Secondary | ICD-10-CM

## 2023-12-21 DIAGNOSIS — D860 Sarcoidosis of lung: Secondary | ICD-10-CM | POA: Insufficient documentation

## 2023-12-21 DIAGNOSIS — H109 Unspecified conjunctivitis: Secondary | ICD-10-CM

## 2023-12-21 DIAGNOSIS — R072 Precordial pain: Secondary | ICD-10-CM | POA: Diagnosis not present

## 2023-12-21 DIAGNOSIS — Z79899 Other long term (current) drug therapy: Secondary | ICD-10-CM | POA: Diagnosis not present

## 2023-12-21 DIAGNOSIS — J45998 Other asthma: Secondary | ICD-10-CM | POA: Diagnosis not present

## 2023-12-21 DIAGNOSIS — Z6841 Body Mass Index (BMI) 40.0 and over, adult: Secondary | ICD-10-CM | POA: Insufficient documentation

## 2023-12-21 DIAGNOSIS — B9689 Other specified bacterial agents as the cause of diseases classified elsewhere: Secondary | ICD-10-CM

## 2023-12-21 DIAGNOSIS — J189 Pneumonia, unspecified organism: Secondary | ICD-10-CM

## 2023-12-21 DIAGNOSIS — R051 Acute cough: Secondary | ICD-10-CM

## 2023-12-21 DIAGNOSIS — E785 Hyperlipidemia, unspecified: Secondary | ICD-10-CM | POA: Diagnosis not present

## 2023-12-21 DIAGNOSIS — Z1152 Encounter for screening for COVID-19: Secondary | ICD-10-CM | POA: Diagnosis not present

## 2023-12-21 DIAGNOSIS — R0602 Shortness of breath: Secondary | ICD-10-CM | POA: Diagnosis present

## 2023-12-21 HISTORY — DX: Pneumonia, unspecified organism: J18.9

## 2023-12-21 LAB — RESPIRATORY PANEL BY PCR

## 2023-12-21 LAB — CBC WITH DIFFERENTIAL/PLATELET
Abs Immature Granulocytes: 0 10*3/uL (ref 0.00–0.07)
Basophils Absolute: 0 10*3/uL (ref 0.0–0.1)
Basophils Relative: 0 %
Eosinophils Absolute: 0 10*3/uL (ref 0.0–0.5)
Eosinophils Relative: 0 %
HCT: 33.2 % — ABNORMAL LOW (ref 36.0–46.0)
Hemoglobin: 10.9 g/dL — ABNORMAL LOW (ref 12.0–15.0)
Lymphocytes Relative: 11 %
Lymphs Abs: 1.4 10*3/uL (ref 0.7–4.0)
MCH: 32.6 pg (ref 26.0–34.0)
MCHC: 32.8 g/dL (ref 30.0–36.0)
MCV: 99.4 fL (ref 80.0–100.0)
Monocytes Absolute: 0.6 10*3/uL (ref 0.1–1.0)
Monocytes Relative: 5 %
Neutro Abs: 10.3 10*3/uL — ABNORMAL HIGH (ref 1.7–7.7)
Neutrophils Relative %: 84 %
Platelets: 332 10*3/uL (ref 150–400)
RBC: 3.34 MIL/uL — ABNORMAL LOW (ref 3.87–5.11)
RDW: 11.8 % (ref 11.5–15.5)
WBC: 12.3 10*3/uL — ABNORMAL HIGH (ref 4.0–10.5)
nRBC: 0 % (ref 0.0–0.2)
nRBC: 0 /100{WBCs}

## 2023-12-21 LAB — COMPREHENSIVE METABOLIC PANEL WITH GFR
ALT: 38 U/L (ref 0–44)
AST: 46 U/L — ABNORMAL HIGH (ref 15–41)
Albumin: 3.5 g/dL (ref 3.5–5.0)
Alkaline Phosphatase: 115 U/L (ref 38–126)
Anion gap: 14 (ref 5–15)
BUN: 9 mg/dL (ref 8–23)
CO2: 25 mmol/L (ref 22–32)
Calcium: 9.4 mg/dL (ref 8.9–10.3)
Chloride: 97 mmol/L — ABNORMAL LOW (ref 98–111)
Creatinine, Ser: 0.84 mg/dL (ref 0.44–1.00)
GFR, Estimated: >60 mL/min (ref >60)
Glucose, Bld: 100 mg/dL — ABNORMAL HIGH (ref 70–99)
Potassium: 4.6 mmol/L (ref 3.5–5.1)
Sodium: 136 mmol/L (ref 135–145)
Total Bilirubin: 1.6 mg/dL — ABNORMAL HIGH (ref 0.0–1.2)
Total Protein: 7.7 g/dL (ref 6.5–8.1)

## 2023-12-21 LAB — RESP PANEL BY RT-PCR (RSV, FLU A&B, COVID)  RVPGX2
Influenza A by PCR: NEGATIVE
Influenza B by PCR: NEGATIVE
Resp Syncytial Virus by PCR: NEGATIVE
SARS Coronavirus 2 by RT PCR: NEGATIVE

## 2023-12-21 LAB — LIPASE, BLOOD: Lipase: 24 U/L (ref 11–51)

## 2023-12-21 LAB — BRAIN NATRIURETIC PEPTIDE: B Natriuretic Peptide: 87.1 pg/mL (ref 0.0–100.0)

## 2023-12-21 LAB — MRSA NEXT GEN BY PCR, NASAL: MRSA by PCR Next Gen: NOT DETECTED

## 2023-12-21 LAB — TROPONIN I (HIGH SENSITIVITY)
Troponin I (High Sensitivity): 11 ng/L (ref <18)
Troponin I (High Sensitivity): 6 ng/L (ref <18)

## 2023-12-21 LAB — I-STAT CG4 LACTIC ACID, ED: Lactic Acid, Venous: 1.4 mmol/L (ref 0.5–1.9)

## 2023-12-21 MED ORDER — ENOXAPARIN SODIUM 40 MG/0.4ML IJ SOSY
40.0000 mg | PREFILLED_SYRINGE | INTRAMUSCULAR | Status: DC
  Administered 2023-12-22 – 2023-12-23 (×2): 40 mg via SUBCUTANEOUS
  Filled 2023-12-21 (×2): qty 0.4

## 2023-12-21 MED ORDER — ACETAMINOPHEN 650 MG RE SUPP: 650.0000 mg | Freq: Four times a day (QID) | RECTAL | Status: DC | PRN

## 2023-12-21 MED ORDER — SODIUM CHLORIDE 0.9 % IV SOLN
500.0000 mg | Freq: Once | INTRAVENOUS | Status: DC
  Administered 2023-12-21: 500 mg via INTRAVENOUS
  Filled 2023-12-21: qty 5

## 2023-12-21 MED ORDER — SENNOSIDES-DOCUSATE SODIUM 8.6-50 MG PO TABS: 1.0000 | ORAL_TABLET | Freq: Every evening | ORAL | Status: DC | PRN

## 2023-12-21 MED ORDER — METHYLPREDNISOLONE SODIUM SUCC 125 MG IJ SOLR
125.0000 mg | Freq: Once | INTRAMUSCULAR | Status: AC
  Administered 2023-12-21: 125 mg via INTRAVENOUS
  Filled 2023-12-21: qty 2

## 2023-12-21 MED ORDER — METHYLPREDNISOLONE SODIUM SUCC 125 MG IJ SOLR
INTRAMUSCULAR | Status: AC
Start: 2023-12-21 — End: ?
  Filled 2023-12-21: qty 2

## 2023-12-21 MED ORDER — ALBUTEROL SULFATE (2.5 MG/3ML) 0.083% IN NEBU
10.0000 mg/h | INHALATION_SOLUTION | Freq: Once | RESPIRATORY_TRACT | Status: AC
  Administered 2023-12-21: 10 mg/h via RESPIRATORY_TRACT
  Filled 2023-12-21: qty 3

## 2023-12-21 MED ORDER — IPRATROPIUM-ALBUTEROL 0.5-2.5 (3) MG/3ML IN SOLN: 3.0000 mL | Freq: Once | RESPIRATORY_TRACT | Status: DC

## 2023-12-21 MED ORDER — ONDANSETRON HCL 4 MG PO TABS: 4.0000 mg | ORAL_TABLET | Freq: Four times a day (QID) | ORAL | Status: DC | PRN

## 2023-12-21 MED ORDER — SODIUM CHLORIDE 0.9 % IV SOLN
1.0000 g | INTRAVENOUS | Status: DC
  Administered 2023-12-22: 1 g via INTRAVENOUS
  Filled 2023-12-21: qty 10

## 2023-12-21 MED ORDER — NIFEDIPINE ER OSMOTIC RELEASE 30 MG PO TB24
30.0000 mg | ORAL_TABLET | Freq: Every day | ORAL | Status: DC
  Administered 2023-12-21 – 2023-12-23 (×3): 30 mg via ORAL
  Filled 2023-12-21 (×4): qty 1

## 2023-12-21 MED ORDER — METHYLPREDNISOLONE SODIUM SUCC 125 MG IJ SOLR
60.0000 mg | Freq: Once | INTRAMUSCULAR | Status: AC
  Administered 2023-12-21: 60 mg via INTRAMUSCULAR

## 2023-12-21 MED ORDER — ALBUTEROL SULFATE (2.5 MG/3ML) 0.083% IN NEBU
INHALATION_SOLUTION | RESPIRATORY_TRACT | Status: AC
  Filled 2023-12-21: qty 3

## 2023-12-21 MED ORDER — ERYTHROMYCIN 5 MG/GM OP OINT
TOPICAL_OINTMENT | Freq: Every day | OPHTHALMIC | Status: DC
  Administered 2023-12-21 – 2023-12-22 (×2): 1 via OPHTHALMIC
  Filled 2023-12-21: qty 1

## 2023-12-21 MED ORDER — SODIUM CHLORIDE 0.9 % IV SOLN: INTRAVENOUS | Status: DC

## 2023-12-21 MED ORDER — FLUTICASONE FUROATE-VILANTEROL 100-25 MCG/ACT IN AEPB
1.0000 | INHALATION_SPRAY | Freq: Every day | RESPIRATORY_TRACT | Status: DC
  Administered 2023-12-22 – 2023-12-23 (×2): 1 via RESPIRATORY_TRACT
  Filled 2023-12-21: qty 28

## 2023-12-21 MED ORDER — ACETAMINOPHEN 325 MG PO TABS
650.0000 mg | ORAL_TABLET | Freq: Four times a day (QID) | ORAL | Status: DC | PRN
  Administered 2023-12-22 – 2023-12-23 (×2): 650 mg via ORAL
  Filled 2023-12-21 (×2): qty 2

## 2023-12-21 MED ORDER — ONDANSETRON HCL 4 MG/2ML IJ SOLN: 4.0000 mg | Freq: Four times a day (QID) | INTRAMUSCULAR | Status: DC | PRN

## 2023-12-21 MED ORDER — PREDNISONE 20 MG PO TABS
40.0000 mg | ORAL_TABLET | Freq: Every day | ORAL | Status: DC
  Administered 2023-12-22 – 2023-12-23 (×2): 40 mg via ORAL
  Filled 2023-12-21 (×2): qty 2

## 2023-12-21 MED ORDER — AZITHROMYCIN 500 MG PO TABS
500.0000 mg | ORAL_TABLET | Freq: Every day | ORAL | Status: AC
  Administered 2023-12-22 – 2023-12-23 (×2): 500 mg via ORAL
  Filled 2023-12-21 (×2): qty 1

## 2023-12-21 MED ORDER — IPRATROPIUM-ALBUTEROL 0.5-2.5 (3) MG/3ML IN SOLN
3.0000 mL | Freq: Four times a day (QID) | RESPIRATORY_TRACT | Status: DC | PRN
Start: 2023-12-21 — End: 2023-12-21

## 2023-12-21 MED ORDER — ATORVASTATIN CALCIUM 40 MG PO TABS
40.0000 mg | ORAL_TABLET | Freq: Every day | ORAL | Status: DC
  Administered 2023-12-21 – 2023-12-23 (×3): 40 mg via ORAL
  Filled 2023-12-21 (×3): qty 1

## 2023-12-21 MED ORDER — ALBUTEROL SULFATE (2.5 MG/3ML) 0.083% IN NEBU
2.5000 mg | INHALATION_SOLUTION | Freq: Once | RESPIRATORY_TRACT | Status: AC
  Administered 2023-12-21: 2.5 mg via RESPIRATORY_TRACT

## 2023-12-21 MED ORDER — SODIUM CHLORIDE 0.9 % IV SOLN
1.0000 g | Freq: Once | INTRAVENOUS | Status: AC
  Administered 2023-12-21: 1 g via INTRAVENOUS
  Filled 2023-12-21: qty 10

## 2023-12-21 MED ORDER — IPRATROPIUM-ALBUTEROL 0.5-2.5 (3) MG/3ML IN SOLN
3.0000 mL | Freq: Three times a day (TID) | RESPIRATORY_TRACT | Status: DC
  Administered 2023-12-21 – 2023-12-22 (×2): 3 mL via RESPIRATORY_TRACT
  Filled 2023-12-21: qty 3

## 2023-12-21 MED ORDER — DM-GUAIFENESIN ER 30-600 MG PO TB12
1.0000 | ORAL_TABLET | Freq: Two times a day (BID) | ORAL | Status: DC
  Administered 2023-12-21 – 2023-12-23 (×4): 1 via ORAL
  Filled 2023-12-21 (×5): qty 1

## 2023-12-21 MED ORDER — GUAIFENESIN 100 MG/5ML PO LIQD
5.0000 mL | ORAL | Status: DC | PRN
  Administered 2023-12-22 – 2023-12-23 (×3): 5 mL via ORAL
  Filled 2023-12-21 (×3): qty 15

## 2023-12-21 NOTE — ED Triage Notes (Signed)
 Pt presents to ED via [facility] from urgent care. Was seen there for productive cough. Dx with pnuemonia.  Solumedrol 60mg  IM and albuterol  neb at urgent care with slight improvement.  RA sat 90%, placed on oxygen at 2 LPM with sat 99%.  Wheezing in all lobes.

## 2023-12-21 NOTE — Discharge Instructions (Addendum)
 Chest x-ray done today.  Final evaluation by the radiologist is still pending. On brief evaluation, there is concern for possible right pneumonia. This would go along with the physical exam findings and the symptoms and duration. We will treat with the following:  Medrol  injection given today. This is a steroid to help with inflammation and pain.  Azithromycin  250mg  Take 2 tablets today and the 1 tablet daily for 4 more days.  Albuterol  inhaler 1-2 puffs every 6 hours as needed for wheezing/shortness of breath.  Benzonatate  (tessalon ) 100 mg every 8 hours as needed for cough.   Start this tomorrow 5/29: Prednisone  40 mg (2 tablets) once daily for 5 days. Take this in the morning.  This is a steroid to help with inflammation and pain. Make an appointment to follow up with your PCP next week for a recheck. Return to urgent care or PCP if symptoms worsen or fail to resolve.    Bacterial conjunctivitis which is an infection in the eyes. We will treat with the following:  Erythromycin place a 1/2 inch ribbon of ointment into the lower eyelid of both eyes at night before bed for 7 days. Wash your hands after touching your eyes or any drainage from your eyes.

## 2023-12-21 NOTE — ED Provider Notes (Signed)
 Patient seen after prior EDP.  Patient would benefit from admission.  Admitting service is aware of case and will evaluate for admission.  Patient understands plan of care.     Michelle Carte, MD 12/21/23 [phone]

## 2023-12-21 NOTE — ED Provider Notes (Signed)
 Emergency Department Provider Note   I have reviewed the triage vital signs and the nursing notes.   HISTORY  Chief Complaint Shortness of Breath   HPI Michelle Rios is a 68 y.o. female with PMH of sarcoidosis, HTN, and AKI presents to the emergency department for evaluation of shortness of breath.  She has had a productive cough and apparent recent diagnosis of pneumonia.  She arrives to urgent care with wheezing.  She had borderline low sats of 90% on room air and was started on 2 L nasal cannula.  Patient denies any chest pain.  No history of asthma or COPD.  She does not use inhalers regularly.  No swelling in the legs.  Past Medical History:  Diagnosis Date   AKI (acute kidney injury) (HCC) 09/15/2015   Anemia    Arthritis    knees (02/20/2018)   Asthma    Bilateral swelling of feet    CAP (community acquired pneumonia) 04/11/2022   Community acquired pneumonia 12/21/2023   Constipation    Daily headache    Diarrhea 09/15/2015   Hiatal hernia    History of blood transfusion 1979   w/childbirth   Hypertension    Lactose intolerance    Osteoarthritis    Pulmonary embolism (HCC) 1990s X 1   Rheumatoid arthritis (HCC)    Sarcoidosis of lung (HCC)    SOB (shortness of breath)     Review of Systems  Constitutional: No fever/chills. Positive weakness.  Cardiovascular: Denies chest pain. Respiratory: Positive shortness of breath and wheezing.  Gastrointestinal: No abdominal pain.  No nausea, no vomiting.   Skin: Negative for rash. Neurological: Negative for headaches.  ____________________________________________   PHYSICAL EXAM:  VITAL SIGNS: ED Triage Vitals  Encounter Vitals Group     BP 12/21/23 1354 (!) 164/82     Pulse Rate 12/21/23 1354 96     Resp 12/21/23 1354 19     Temp 12/21/23 1354 97.8 F (36.6 C)     Temp Source 12/21/23 1354 Oral     SpO2 12/21/23 1350 100 %     Weight 12/21/23 1351 260 lb (117.9 kg)     Height 12/21/23 1351 5' 1  (1.549 m)   Constitutional: Alert and oriented. Well appearing and in no acute distress. Eyes: Conjunctivae are normal.  Head: Atraumatic. Nose: No congestion/rhinnorhea. Mouth/Throat: Mucous membranes are moist.   Neck: No stridor.  Cardiovascular: Normal rate, regular rhythm. Good peripheral circulation. Grossly normal heart sounds.   Respiratory: Increased respiratory effort.  No retractions. Lungs wheezing bilaterally.  Gastrointestinal: Soft and nontender. No distention.  Musculoskeletal: No lower extremity tenderness nor edema. No gross deformities of extremities. Neurologic:  Normal speech and language. No gross focal neurologic deficits are appreciated.  Skin:  Skin is warm, dry and intact. No rash noted.  ____________________________________________   LABS (all labs ordered are listed, but only abnormal results are displayed)  Labs Reviewed  CULTURE, BLOOD (ROUTINE X 2) - Abnormal; Notable for the following components:      Result Value   Culture STAPHYLOCOCCUS HOMINIS (*)    All other components within normal limits  CULTURE, BLOOD (ROUTINE X 2) - Abnormal; Notable for the following components:   Culture   (*)    Value: STAPHYLOCOCCUS HOMINIS SUSCEPTIBILITIES PERFORMED ON PREVIOUS CULTURE WITHIN THE LAST 5 DAYS.    All other components within normal limits  BLOOD CULTURE ID PANEL (REFLEXED) - BCID2 - Abnormal; Notable for the following components:   Staphylococcus species  DETECTED (*)    Staphylococcus epidermidis DETECTED (*)    Methicillin resistance mecA/C DETECTED (*)    All other components within normal limits  COMPREHENSIVE METABOLIC PANEL WITH GFR - Abnormal; Notable for the following components:   Chloride 97 (*)    Glucose, Bld 100 (*)    AST 46 (*)    Total Bilirubin 1.6 (*)    All other components within normal limits  CBC WITH DIFFERENTIAL/PLATELET - Abnormal; Notable for the following components:   WBC 12.3 (*)    RBC 3.34 (*)    Hemoglobin 10.9  (*)    HCT 33.2 (*)    Neutro Abs 10.3 (*)    All other components within normal limits  CBC - Abnormal; Notable for the following components:   RBC 3.20 (*)    Hemoglobin 10.6 (*)    HCT 31.7 (*)    All other components within normal limits  BASIC METABOLIC PANEL WITH GFR - Abnormal; Notable for the following components:   Glucose, Bld 148 (*)    All other components within normal limits  CBC - Abnormal; Notable for the following components:   WBC 11.7 (*)    RBC 3.32 (*)    Hemoglobin 10.8 (*)    HCT 33.4 (*)    MCV 100.6 (*)    All other components within normal limits  BASIC METABOLIC PANEL WITH GFR - Abnormal; Notable for the following components:   Sodium 134 (*)    Glucose, Bld 115 (*)    All other components within normal limits  RESP PANEL BY RT-PCR (RSV, FLU A&B, COVID)  RVPGX2  MRSA NEXT GEN BY PCR, NASAL  RESPIRATORY PANEL BY PCR  LIPASE, BLOOD  BRAIN NATRIURETIC PEPTIDE  PROCALCITONIN  HIV ANTIBODY (ROUTINE TESTING W REFLEX)  I-STAT CG4 LACTIC ACID, ED  TROPONIN I (HIGH SENSITIVITY)  TROPONIN I (HIGH SENSITIVITY)   ____________________________________________  EKG   EKG Interpretation Date/Time:  Wednesday Dec 21 2023 13:57:46 EDT Ventricular Rate:  87 PR Interval:  164 QRS Duration:  79 QT Interval:  347 QTC Calculation: 418 R Axis:   3  Text Interpretation: Sinus rhythm Low voltage, precordial leads Anteroseptal infarct, old Confirmed by Abby Hocking [phone]) on 12/21/2023 3:41:37 PM        ____________________________________________  RADIOLOGY  DG Chest 2 View Result Date: 12/21/2023 CLINICAL DATA:  Productive cough EXAM: CHEST - 2 VIEW COMPARISON:  Chest radiograph 04/19/2022 FINDINGS: Stable cardiac contours. Masslike enlargement of the right hilar region. Additionally there is focal consolidation/mass within the right mid lung. No pleural effusion or pneumothorax. Thoracic spine degenerative changes. IMPRESSION: Masslike enlargement of the  right hilar region. Considerations include prominent hilar structures versus adenopathy versus mass. Additionally there is focal consolidation/mass within the right mid lung. Findings are nonspecific in etiology. Considerations include infection or underlying mass lesion. Given the complex findings, recommend further evaluation with contrast-enhanced chest CT. Electronically Signed   By: Jone Neither M.D.   On: 12/21/2023 14:53    ____________________________________________   PROCEDURES  Procedure(s) performed:   Procedures  None  ____________________________________________   INITIAL IMPRESSION / ASSESSMENT AND PLAN / ED COURSE  Pertinent labs & imaging results that were available during my care of the patient were reviewed by me and considered in my medical decision making (see chart for details).   This patient is Presenting for Evaluation of SOB, which does require a range of treatment options, and is a complaint that involves a high risk of morbidity  and mortality.  The Differential Diagnoses includes but is not exclusive to acute coronary syndrome, aortic dissection, pulmonary embolism, cardiac tamponade, community-acquired pneumonia, pericarditis, musculoskeletal chest wall pain, etc.   Critical Interventions-    Medications  methylPREDNISolone  sodium succinate (SOLU-MEDROL ) 125 mg/2 mL injection 125 mg (125 mg Intravenous Given 12/21/23 1508)  albuterol  (PROVENTIL ) (2.5 MG/3ML) 0.083% nebulizer solution (10 mg/hr Nebulization Given 12/21/23 1529)  cefTRIAXone  (ROCEPHIN ) 1 g in sodium chloride  0.9 % 100 mL IVPB (0 g Intravenous Stopped 12/21/23 1948)  azithromycin  (ZITHROMAX ) tablet 500 mg (500 mg Oral Given 12/23/23 0815)  oxyCODONE  (Oxy IR/ROXICODONE ) immediate release tablet 5 mg (5 mg Oral Given 12/23/23 0621)  ketorolac  (TORADOL ) 15 MG/ML injection 15 mg (15 mg Intravenous Given 12/23/23 0946)    Reassessment after intervention: symptoms improved.   Clinical Laboratory  Tests Ordered, included CBC with leukocytosis. No AKI.   Radiologic Tests Ordered, included CXR. I independently interpreted the images and agree with radiology interpretation.   Cardiac Monitor Tracing which shows NSR.    Social Determinants of Health Risk patient is a non-smoker.   Consult complete with [facility]. Plan for admit.   Medical Decision Making: Summary:  Patient presents to the emergency department for evaluation of shortness of breath, wheezing in all fields.  Given steroid and continuous neb on arrival.  Symptoms seem to be improving.  Patient does have history of sarcoid.  Reevaluation with update and discussion with patient. Plan for admit. She is in agreement.   Patient's presentation is most consistent with acute presentation with potential threat to life or bodily function.   Disposition: admit  ____________________________________________  FINAL CLINICAL IMPRESSION(S) / ED DIAGNOSES  Final diagnoses:  Dyspnea, unspecified type     NEW OUTPATIENT MEDICATIONS STARTED DURING THIS VISIT:  Discharge Medication List as of 12/23/2023  9:58 AM     START taking these medications   Details  albuterol  (VENTOLIN  HFA) 108 (90 Base) MCG/ACT inhaler Inhale 2 puffs into the lungs every 6 (six) hours as needed for wheezing or shortness of breath., Starting Fri 12/23/2023, Normal    azithromycin  (ZITHROMAX ) 500 MG tablet Take 1 tablet (500 mg total) by mouth daily for 3 days., Starting Fri 12/23/2023, Until Mon 12/26/2023, Normal    amoxicillin -clavulanate (AUGMENTIN ) 875-125 MG tablet Take 1 tablet by mouth 2 (two) times daily., Starting Fri 12/23/2023, Normal    guaiFENesin  (ROBITUSSIN) 100 MG/5ML liquid Take 5 mLs by mouth every 4 (four) hours as needed for cough or to loosen phlegm., Starting Fri 12/23/2023, Normal    oxyCODONE  (OXY IR/ROXICODONE ) 5 MG immediate release tablet Take 1 tablet (5 mg total) by mouth every 6 (six) hours as needed for severe pain (pain score 7-10).,  Starting Fri 12/23/2023, Normal    predniSONE  (DELTASONE ) 20 MG tablet Take 2 tablets (40 mg total) by mouth daily after breakfast., Starting Sat 12/24/2023, Normal        Note:  This document was prepared using Dragon voice recognition software and may include unintentional dictation errors.  Abby Hocking, MD, [facility]    Shariff Lasky, Shereen Dike, MD 01/05/24 [phone]

## 2023-12-21 NOTE — H&P (Signed)
 History and Physical  [name] QMV:[id number] DOB: 12-15-1955 DOA: 12/21/2023  PCP[facility] [address]   Chief Complaint: SOB, pneumonia  HPI: Michelle Rios is a 68 y.o. female with medical history significant for pulmonary sarcoidosis, HTN, osteoarthritis, asthma and HLD who presented to the ED for evaluation of shortness of breath.  Patient reports he has had a congested cough and shortness of breath since last Thursday.  She has been using NyQuil without significant improvement. Yesterday, started having dyspnea on exertion just walking to her back from so she called her PCP who advised her to present to the urgent care.  She has had associated wheezing, significant fatigue, subjective fever, loss of appetite and chest tightness from coughing.  She also reports she has noticed her eyes have become red with discharge and slight burning sensation since yesterday.  She denies any vision loss, chest pain, palpitations, nausea, vomiting, abdominal pain, sore throat or dysuria.  At the urgent care, x-ray showed pneumonia. She received IM Solu-Medrol  60 mg x 1 and nebulized treatment without significant improvement in her shortness of breath so she was sent to the ED via EMS.  ED Course: Initial vitals shows patient afebrile, mildly hypertensive with SBP in the 130s to 150s, SpO2 93% on room air, briefly placed on 2 L [place] due to mild drop in SpO2. Initial labs shows mild leukocytosis at 12.3, Hgb 10.9, negative flu, RSV and COVID test, normal CMP, lipase and troponin.  CT chest shows right upper lobe pneumonia versus mass.  Patient received IV Solu-Medrol  125 mg x 1, albuterol  nebs x 1, IV Rocephin  and IV azithromycin . [facility] was consulted for admission.  Review of Systems: Please see HPI for pertinent positives and negatives. A complete 10 system review of systems are otherwise negative.  Past Medical History:  Diagnosis Date   AKI (acute kidney injury) (HCC) 09/15/2015   Anemia    Arthritis     "knees" (02/20/2018)   Asthma    Bilateral swelling of feet    Constipation    Daily headache    Diarrhea 09/15/2015   Hiatal hernia    History of blood transfusion 1979   w/childbirth   Hypertension    Lactose intolerance    Osteoarthritis    Pulmonary embolism (HCC) 1990s X 1   Rheumatoid arthritis (HCC)    Sarcoidosis of lung (HCC)    SOB (shortness of breath)    Past Surgical History:  Procedure Laterality Date   ANKLE FRACTURE SURGERY Right    KNEE ARTHROSCOPY Right 1990s   TUBAL LIGATION  1990s   VAGINAL HYSTERECTOMY  1990s   WRIST FRACTURE SURGERY Left 2001   Social History:  reports that she has never smoked. She has never used smokeless tobacco. She reports that she does not drink alcohol and does not use drugs.  Allergies  Allergen Reactions   Ivp Dye [Iodinated Contrast Media] Anaphylaxis and Swelling   Voltaren  [Diclofenac  Sodium] Other (See Comments)    Pt states had bad state in mouth, mouth and tongue turned black, and then pt vomited thick white mucous.    Family History  Problem Relation Age of Onset   Diabetes Mother    Kidney disease Mother    Hyperlipidemia Father    Hypertension Father    Heart attack Father    Heart disease Father    Sudden death Father    Alcoholism Father    Breast cancer Sister    Kidney cancer Sister  Diabetes Brother    Stroke Paternal Grandmother      Prior to Admission medications   Medication Sig Start Date End Date Taking? Authorizing Provider  acetaminophen  (TYLENOL ) 325 MG tablet Take 650 mg by mouth as needed for mild pain or moderate pain.    [provider]  atorvastatin (LIPITOR) 40 MG tablet Take 40 mg by mouth daily. 11/16/23   [provider]  dicyclomine  (BENTYL ) 20 MG tablet Take 1 tablet (20 mg total) by mouth 2 (two) times daily. 08/30/23   Scarlette Currier, MD  nabumetone  (RELAFEN ) 500 MG tablet Take 1 tablet (500 mg total) by mouth daily. 11/01/23   Zamora, Erin R, NP  polyethylene  glycol (MIRALAX ) 17 g packet Take 17 g by mouth daily as needed. 04/21/22   Rizwan, Saima, MD  Vitamin D , Ergocalciferol , (DRISDOL ) 1.25 MG (50000 UNIT) CAPS capsule Take 1 capsule (50,000 Units total) by mouth every 7 (seven) days. 08/18/21   Jenean Minus, MD    Physical Exam: BP (!) 147/82   Pulse 85   Temp 98.5 F (36.9 C) (Oral)   Resp 13   Ht 5\' 1"  (1.549 m)   Wt 117.9 kg   SpO2 99%   BMI 49.13 kg/m  General: Pleasant, sick appearing obese woman laying in bed. No acute distress. HEENT: [place]/AT.  Dry mucous membrane. Conjunctival injection bilaterally. CV: RRR. No murmurs, rubs, or gallops. No LE edema Pulmonary: Lungs CTAB. Normal effort.  Expiratory wheezes throughout. Abdominal: Soft, nontender, nondistended. Normal bowel sounds. Extremities: Palpable radial and DP pulses. Normal ROM. Skin: Warm and dry. No obvious rash or lesions. Neuro: A&Ox3. Moves all extremities. Normal sensation to light touch. No focal deficit. Psych: Normal mood and affect          Labs on Admission:  Basic Metabolic Panel: Recent Labs  Lab 12/21/23 1500  NA 136  K 4.6  CL 97*  CO2 25  GLUCOSE 100*  BUN 9  CREATININE 0.84  CALCIUM 9.4   Liver Function Tests: Recent Labs  Lab 12/21/23 1500  AST 46*  ALT 38  ALKPHOS 115  BILITOT 1.6*  PROT 7.7  ALBUMIN  3.5   Recent Labs  Lab 12/21/23 1500  LIPASE 24   No results for input(s): "AMMONIA" in the last 168 hours. CBC: Recent Labs  Lab 12/21/23 1500  WBC 12.3*  NEUTROABS 10.3*  HGB 10.9*  HCT 33.2*  MCV 99.4  PLT 332   Cardiac Enzymes: No results for input(s): "CKTOTAL", "CKMB", "CKMBINDEX", "TROPONINI" in the last 168 hours. BNP (last 3 results) Recent Labs    12/21/23 1500  BNP 87.1    ProBNP (last 3 results) No results for input(s): "PROBNP" in the last 8760 hours.  CBG: No results for input(s): "GLUCAP" in the last 168 hours.  Radiological Exams on Admission: CT CHEST WO CONTRAST Result Date:  12/21/2023 CLINICAL DATA:  Respiratory illness. EXAM: CT CHEST WITHOUT CONTRAST TECHNIQUE: Multidetector CT imaging of the chest was performed following the standard protocol without IV contrast. RADIATION DOSE REDUCTION: This exam was performed according to the departmental dose-optimization program which includes automated exposure control, adjustment of the mA and/or kV according to patient size and/or use of iterative reconstruction technique. COMPARISON:  Chest radiograph dated 12/21/2023. Chest CT dated 04/19/2022. FINDINGS: Evaluation of this exam is limited in the absence of intravenous contrast. Cardiovascular: There is no cardiomegaly or pericardial effusion. Mild atherosclerotic calcification of the thoracic aorta. No aneurysmal dilatation. The central pulmonary arteries are grossly  unremarkable. Mediastinum/Nodes: No obvious hilar or mediastinal adenopathy. Evaluation however is limited in the absence of intravenous contrast and due to respiratory motion. The esophagus is grossly unremarkable. No mediastinal fluid collection. Lungs/Pleura: Consolidative changes of the right upper lobe extending into the right hilum may be infectious in etiology. Underlying mass is not excluded clinical correlation and close follow-up after treatment recommended to document resolution. Probable trace left pleural effusion or left lung base atelectasis. There is no pneumothorax. The central airways are patent. Upper Abdomen: No acute abnormality. Musculoskeletal: Degenerative changes of the spine. No acute osseous pathology. IMPRESSION: 1. Right upper lobe pneumonia versus mass. Clinical correlation and close follow-up after treatment recommended to document resolution. 2.  Aortic Atherosclerosis (ICD10-I70.0). Electronically Signed   By: Angus Bark M.D.   On: 12/21/2023 17:56   DG Chest Portable 1 View Result Date: 12/21/2023 CLINICAL DATA:  SOB EXAM: PORTABLE CHEST - 1 VIEW COMPARISON:  Dec 21, 2023 FINDINGS:  Lower lung volumes. Unchanged masslike opacity in the right suprahilar region. Streaky opacities in both lung bases, which may represent atelectasis. No focal airspace consolidation, pleural effusion, or pneumothorax. No cardiomegaly. Tortuous aorta with aortic atherosclerosis. No acute fracture or destructive lesion. Multilevel thoracic osteophytosis. Bilateral glenohumeral joint osteoarthritis. IMPRESSION: Low lung volumes. Unchanged masslike opacity in the right suprahilar region. A chest CT with IV contrast is recommended for further characterization. Electronically Signed   By: Rance Burrows M.D.   On: 12/21/2023 16:13   DG Chest 2 View Result Date: 12/21/2023 CLINICAL DATA:  Productive cough EXAM: CHEST - 2 VIEW COMPARISON:  Chest radiograph 04/19/2022 FINDINGS: Stable cardiac contours. Masslike enlargement of the right hilar region. Additionally there is focal consolidation/mass within the right mid lung. No pleural effusion or pneumothorax. Thoracic spine degenerative changes. IMPRESSION: Masslike enlargement of the right hilar region. Considerations include prominent hilar structures versus adenopathy versus mass. Additionally there is focal consolidation/mass within the right mid lung. Findings are nonspecific in etiology. Considerations include infection or underlying mass lesion. Given the complex findings, recommend further evaluation with contrast-enhanced chest CT. Electronically Signed   By: Jone Neither M.D.   On: 12/21/2023 14:53   Assessment/Plan CLARICE ZULAUF is a 68 y.o. female with medical history significant for pulmonary sarcoidosis, HTN, osteoarthritis, asthma, and HLD who presented to the ED for evaluation of shortness of breath and admitted for community-acquired pneumonia.  # Community-acquired pneumonia -Sent to the ED from the urgent care after diagnosis of pneumonia -CT chest showing right upper lobe pneumonia versus mass -Admit to MedSurg for observation -IV Rocephin   and oral azithromycin  -Follow-up sputum culture and MRSA screen -Check full RVP, procalcitonin, urinary Legionella and strep pneumo -Trend CBC, fever curve -Incentive spirometer, flutter valve -Supplemental O2 as needed -Will need follow-up with pulmonology for repeat chest imaging  # Asthma exacerbation -In the setting of community-acquired pneumonia -Status post IV Solu-Medrol  in the ED -Prednisone  40 mg daily -Start Breo Ellipta 1 puff daily -As needed DuoNebs  # Pulmonary sarcoidosis -Outpatient follow-up with pulmonology  # Bacterial conjunctivitis -Reports 1 to 2 days of red eyes with some purulent drainage and burning sensation but no vision loss. -Continue erythromycin ointment nightly  # HLD - Continue atorvastatin  # HTN - BP elevated with SBP in the 130s to 150s. - Continue  nifedipine  # Class III obesity Body mass index is 49.13 kg/m. Filed Weights   12/21/23 1351  Weight: 117.9 kg  -Follow-up with PCP for nutrition and weight loss counseling  DVT prophylaxis: Lovenox      Code Status: Full Code  Consults called: None  Family Communication: No family at bedside  Severity of Illness: The appropriate patient status for this patient is OBSERVATION. Observation status is judged to be reasonable and necessary in order to provide the required intensity of service to ensure the patient's safety. The patient's presenting symptoms, physical exam findings, and initial radiographic and laboratory data in the context of their medical condition is felt to place them at decreased risk for further clinical deterioration. Furthermore, it is anticipated that the patient will be medically stable for discharge from the hospital within 2 midnights of admission.   Level of care: Med-Surg   This record has been created using Conservation officer, historic buildings. Errors have been sought and corrected, but may not always be located. Such creation errors do not reflect on the  standard of care.   Vita Grip, MD 12/21/2023, 6:49 PM [facility] Pager: [phone] [place] 41:10   If 7PM-7AM, please contact night-coverage [email or web address] Password [email or web address]

## 2023-12-21 NOTE — ED Provider Notes (Signed)
 [facility]    CSN: [id number] Arrival date & time: 12/21/23  1002      History   Chief Complaint Chief Complaint  Patient presents with   Cough    HPI Michelle Rios is a 68 y.o. female.   68 year old female who presents urgent care with complaints of coughing, poor appetite, redness and discharge from the eyes, shortness of breath and tightness along the ribs from coughing.  Her symptoms started on Thursday last week.  She did run a fever yesterday but has not run 1 before or after.  She is also having wheezing and shortness of breath with walking.  She is not having any chest pain but does relate soreness in her sides from coughing.  She has not had much of an appetite but is trying to drink and stay hydrated.  She denies nausea, vomiting, dysuria, hematuria, abdominal pain, ear pain, sore throat.  At home she has been taking NyQuil.  Her eyes started to have redness and discharge over the last several days.  It is now a thick yellow looking discharge.  Her eyes do hurt as well.  She is not having any loss of vision or change in vision.  She denies any sick contacts.  She reports in general she feels like she has been hit by a truck with diffuse body aches.      Cough Associated symptoms: fever, shortness of breath and wheezing   Associated symptoms: no chest pain, no chills, no ear pain, no rash and no sore throat     Past Medical History:  Diagnosis Date   AKI (acute kidney injury) (HCC) 09/15/2015   Anemia    Arthritis    "knees" (02/20/2018)   Asthma    Bilateral swelling of feet    Constipation    Daily headache    Diarrhea 09/15/2015   Hiatal hernia    History of blood transfusion 1979   w/childbirth   Hypertension    Lactose intolerance    Osteoarthritis    Pulmonary embolism (HCC) 1990s X 1   Rheumatoid arthritis (HCC)    Sarcoidosis of lung (HCC)    SOB (shortness of breath)     Patient Active Problem List   Diagnosis Date Noted   Right  flank pain 04/20/2022   Pleural effusion, right 04/20/2022   History of sarcoidosis 04/20/2022   Lymphadenopathy 04/20/2022   Elevated liver enzymes 04/20/2022   Thyroid nodule 04/13/2022   CAP (community acquired pneumonia) 04/11/2022   Acute respiratory failure with hypoxia (HCC) 04/11/2022   Vitamin D  deficiency 04/11/2022   Right-sided chest pain, pleuritic    Abdominal pain 02/20/2018   Diarrhea 02/20/2018   Unexplained night sweats 02/01/2018   Rash 02/01/2018   Anemia of chronic disease 12/06/2017   HLD (hyperlipidemia) 12/06/2017   Acute reaction to stress 11/12/2017   Pain in right knee 09/23/2017   Essential hypertension 02/09/2017   Obesity, Class III, BMI 40-49.9 (morbid obesity) 02/09/2017   Sarcoidosis of lung (HCC) 02/09/2017   Right upper quadrant abdominal tenderness    SOB (shortness of breath)    AKI (acute kidney injury) (HCC) 09/15/2015    Past Surgical History:  Procedure Laterality Date   ANKLE FRACTURE SURGERY Right    KNEE ARTHROSCOPY Right 1990s   TUBAL LIGATION  1990s   VAGINAL HYSTERECTOMY  1990s   WRIST FRACTURE SURGERY Left 2001    OB History     Gravida  3   Para  Term      Preterm      AB      Living         SAB      IAB      Ectopic      Multiple      Live Births               Home Medications    Prior to Admission medications   Medication Sig Start Date End Date Taking? Authorizing Provider  atorvastatin (LIPITOR) 40 MG tablet Take 40 mg by mouth daily. 11/16/23  Yes [provider]  acetaminophen  (TYLENOL ) 325 MG tablet Take 650 mg by mouth as needed for mild pain or moderate pain.    [provider]  dicyclomine  (BENTYL ) 20 MG tablet Take 1 tablet (20 mg total) by mouth 2 (two) times daily. 08/30/23   Scarlette Currier, MD  nabumetone  (RELAFEN ) 500 MG tablet Take 1 tablet (500 mg total) by mouth daily. 11/01/23   Zamora, Erin R, NP  polyethylene glycol (MIRALAX ) 17 g packet Take 17 g by  mouth daily as needed. 04/21/22   Rizwan, Saima, MD  Vitamin D , Ergocalciferol , (DRISDOL ) 1.25 MG (50000 UNIT) CAPS capsule Take 1 capsule (50,000 Units total) by mouth every 7 (seven) days. 08/18/21   Jenean Minus, MD    Family History Family History  Problem Relation Age of Onset   Diabetes Mother    Kidney disease Mother    Hyperlipidemia Father    Hypertension Father    Heart attack Father    Heart disease Father    Sudden death Father    Alcoholism Father    Breast cancer Sister    Kidney cancer Sister    Diabetes Brother    Stroke Paternal Grandmother     Social History Social History   Tobacco Use   Smoking status: Never   Smokeless tobacco: Never  Vaping Use   Vaping status: Never Used  Substance Use Topics   Alcohol use: No   Drug use: No     Allergies   Ivp dye [iodinated contrast media] and Voltaren  [diclofenac  sodium]   Review of Systems Review of Systems  Constitutional:  Positive for appetite change, fatigue and fever. Negative for chills.  HENT:  Negative for ear pain and sore throat.   Eyes:  Negative for pain and visual disturbance.  Respiratory:  Positive for cough, chest tightness, shortness of breath and wheezing.   Cardiovascular:  Negative for chest pain and palpitations.  Gastrointestinal:  Negative for abdominal pain, constipation, nausea and vomiting.  Genitourinary:  Negative for dysuria and hematuria.  Musculoskeletal:  Negative for arthralgias and back pain.  Skin:  Negative for color change and rash.  Neurological:  Negative for seizures and syncope.  All other systems reviewed and are negative.    Physical Exam Triage Vital Signs ED Triage Vitals [12/21/23 1150]  Encounter Vitals Group     BP (!) 153/81     Systolic BP Percentile      Diastolic BP Percentile      Pulse Rate 89     Resp 16     Temp 98.8 F (37.1 C)     Temp Source Oral     SpO2 93 %     Weight      Height      Head Circumference      Peak Flow       Pain Score 6     Pain  Loc      Pain Education      Exclude from Growth Chart    No data found.  Updated Vital Signs BP (!) 153/81 (BP Location: Right Arm)   Pulse 89   Temp 98.8 F (37.1 C) (Oral)   Resp 16   SpO2 97%   Visual Acuity Right Eye Distance:   Left Eye Distance:   Bilateral Distance:    Right Eye Near:   Left Eye Near:    Bilateral Near:     Physical Exam Vitals and nursing note reviewed.  Constitutional:      General: She is not in acute distress.    Appearance: She is well-developed.  HENT:     Head: Normocephalic and atraumatic.  Eyes:     Conjunctiva/sclera:     Right eye: Right conjunctiva is injected. Exudate present.     Left eye: Left conjunctiva is injected. Exudate present.  Cardiovascular:     Rate and Rhythm: Normal rate and regular rhythm.     Heart sounds: No murmur heard. Pulmonary:     Effort: Pulmonary effort is normal. No tachypnea, accessory muscle usage or respiratory distress.     Breath sounds: Examination of the right-upper field reveals wheezing. Examination of the left-upper field reveals wheezing. Examination of the right-middle field reveals decreased breath sounds and wheezing. Examination of the left-middle field reveals decreased breath sounds and wheezing. Examination of the right-lower field reveals decreased breath sounds and wheezing. Examination of the left-lower field reveals decreased breath sounds and wheezing. Decreased breath sounds and wheezing present.  Abdominal:     Palpations: Abdomen is soft.     Tenderness: There is no abdominal tenderness.  Musculoskeletal:        General: No swelling.     Cervical back: Neck supple.  Skin:    General: Skin is warm and dry.     Capillary Refill: Capillary refill takes less than 2 seconds.  Neurological:     Mental Status: She is alert.  Psychiatric:        Mood and Affect: Mood normal.      UC Treatments / Results  Labs (all labs ordered are listed, but only  abnormal results are displayed) Labs Reviewed  CBC WITH DIFFERENTIAL/PLATELET    EKG   Radiology No results found.  Procedures Procedures (including critical care time)  Medications Ordered in UC Medications  methylPREDNISolone  sodium succinate (SOLU-MEDROL ) 125 mg/2 mL injection 60 mg (60 mg Intramuscular Given 12/21/23 1246)  albuterol  (PROVENTIL ) (2.5 MG/3ML) 0.083% nebulizer solution 2.5 mg (2.5 mg Nebulization Given 12/21/23 1246)    Initial Impression / Assessment and Plan / UC Course  I have reviewed the triage vital signs and the nursing notes.  Pertinent labs & imaging results that were available during my care of the patient were reviewed by me and considered in my medical decision making (see chart for details).     Acute cough - Plan: DG Chest 2 View, DG Chest 2 View  Bacterial conjunctivitis of both eyes  Community acquired pneumonia of right middle lobe of lung   Chest x-ray done today.  Final evaluation by the radiologist is still pending. On brief evaluation, there is concern for possible right pneumonia. This would go along with the physical exam findings and the symptoms and duration.  Initially, our plan was to treat the patient outpatient for community-acquired pneumonia and bacterial conjunctivitis however on second evaluation after the albuterol  treatment, the patient complained of worsening shortness of  breath, chest pain and on physical exam her lung exam had not improved and she was using accessory muscles for breathing.  We ordered an EKG which showed sinus rhythm and given the worsening symptoms we opted to send the patient to the emergency room.  The patient requested EMS transportation as she is by herself and did not feel comfortable driving due to the severity of her symptoms.  A CBC was drawn and an IV was started prior to EMS picking the patient up.  Further evaluation per the emergency department.  Final Clinical Impressions(s) / UC Diagnoses    Final diagnoses:  Acute cough  Bacterial conjunctivitis of both eyes  Community acquired pneumonia of right middle lobe of lung     Discharge Instructions      Chest x-ray done today.  Final evaluation by the radiologist is still pending. On brief evaluation, there is concern for possible right pneumonia. This would go along with the physical exam findings and the symptoms and duration. We will treat with the following:  Medrol  injection given today. This is a steroid to help with inflammation and pain.  Azithromycin  250mg  Take 2 tablets today and the 1 tablet daily for 4 more days.  Albuterol  inhaler 1-2 puffs every 6 hours as needed for wheezing/shortness of breath.  Benzonatate  (tessalon ) 100 mg every 8 hours as needed for cough.   Start this tomorrow 5/29: Prednisone  40 mg (2 tablets) once daily for 5 days. Take this in the morning.  This is a steroid to help with inflammation and pain. Make an appointment to follow up with your PCP next week for a recheck. Return to urgent care or PCP if symptoms worsen or fail to resolve.    Bacterial conjunctivitis which is an infection in the eyes. We will treat with the following:  Erythromycin place a 1/2 inch ribbon of ointment into the lower eyelid of both eyes at night before bed for 7 days. Wash your hands after touching your eyes or any drainage from your eyes.   ED Prescriptions   None    PDMP not reviewed this encounter.   Kreg Pesa, [place] 12/21/23 1334

## 2023-12-21 NOTE — ED Notes (Signed)
 Lab at bedside for blood cultures.

## 2023-12-21 NOTE — ED Notes (Signed)
 Patient transported to CT

## 2023-12-21 NOTE — ED Notes (Signed)
 Patient is being discharged from the Urgent Care and sent to the Emergency Department via [facility] . Per Michelle Rios, [name], patient is in need of higher level of care due to SOB, chest pain, cough, and wheeze. Patient is aware and verbalizes understanding of plan of care.  Vitals:   12/21/23 1150 12/21/23 1305  BP: (!) 153/81   Pulse: 89   Resp: 16   Temp: 98.8 F (37.1 C)   SpO2: 93% 97%

## 2023-12-21 NOTE — ED Notes (Signed)
 CCMD notified pt placed on telemetry

## 2023-12-21 NOTE — ED Notes (Signed)
Lab at bedside to collect troponin

## 2023-12-21 NOTE — ED Triage Notes (Signed)
 Patient here today with c/o productive cough, nasal drainage, wheeze, SOB, body aches, and chills since Thursday evening. Patient tried taking Nyquil with some relief.

## 2023-12-21 NOTE — ED Notes (Signed)
RT notified of order for nebulizer.

## 2023-12-22 DIAGNOSIS — R0609 Other forms of dyspnea: Secondary | ICD-10-CM

## 2023-12-22 DIAGNOSIS — J45901 Unspecified asthma with (acute) exacerbation: Secondary | ICD-10-CM | POA: Diagnosis not present

## 2023-12-22 DIAGNOSIS — J189 Pneumonia, unspecified organism: Secondary | ICD-10-CM | POA: Diagnosis not present

## 2023-12-22 LAB — BLOOD CULTURE ID PANEL (REFLEXED) - BCID2

## 2023-12-22 LAB — BASIC METABOLIC PANEL WITH GFR
Anion gap: 14 (ref 5–15)
BUN: 13 mg/dL (ref 8–23)
CO2: 26 mmol/L (ref 22–32)
Calcium: 9.3 mg/dL (ref 8.9–10.3)
Chloride: 99 mmol/L (ref 98–111)
Creatinine, Ser: 0.84 mg/dL (ref 0.44–1.00)
GFR, Estimated: >60 mL/min (ref >60)
Glucose, Bld: 148 mg/dL — ABNORMAL HIGH (ref 70–99)
Potassium: 4.1 mmol/L (ref 3.5–5.1)
Sodium: 139 mmol/L (ref 135–145)

## 2023-12-22 LAB — CBC
HCT: 31.7 % — ABNORMAL LOW (ref 36.0–46.0)
Hemoglobin: 10.6 g/dL — ABNORMAL LOW (ref 12.0–15.0)
MCH: 33.1 pg (ref 26.0–34.0)
MCHC: 33.4 g/dL (ref 30.0–36.0)
MCV: 99.1 fL (ref 80.0–100.0)
Platelets: 345 10*3/uL (ref 150–400)
RBC: 3.2 MIL/uL — ABNORMAL LOW (ref 3.87–5.11)
RDW: 11.9 % (ref 11.5–15.5)
WBC: 10 10*3/uL (ref 4.0–10.5)
nRBC: 0 % (ref 0.0–0.2)

## 2023-12-22 LAB — HIV ANTIBODY (ROUTINE TESTING W REFLEX): HIV Screen 4th Generation wRfx: NONREACTIVE

## 2023-12-22 LAB — PROCALCITONIN: Procalcitonin: <0.1 ng/mL

## 2023-12-22 MED ORDER — LIDOCAINE 5 % EX PTCH
1.0000 | MEDICATED_PATCH | CUTANEOUS | Status: DC
  Administered 2023-12-22 – 2023-12-23 (×2): 1 via TRANSDERMAL
  Filled 2023-12-22 (×2): qty 1

## 2023-12-22 MED ORDER — IPRATROPIUM-ALBUTEROL 0.5-2.5 (3) MG/3ML IN SOLN
3.0000 mL | Freq: Two times a day (BID) | RESPIRATORY_TRACT | Status: DC
  Administered 2023-12-22 – 2023-12-23 (×2): 3 mL via RESPIRATORY_TRACT
  Filled 2023-12-22 (×2): qty 3

## 2023-12-22 NOTE — Plan of Care (Signed)
   Problem: Education: Goal: Knowledge of General Education information will improve Description Including pain rating scale, medication(s)/side effects and non-pharmacologic comfort measures Outcome: Progressing   Problem: Clinical Measurements: Goal: Ability to maintain clinical measurements within normal limits will improve Outcome: Progressing   Problem: Clinical Measurements: Goal: Will remain free from infection Outcome: Progressing

## 2023-12-22 NOTE — Plan of Care (Signed)

## 2023-12-22 NOTE — Hospital Course (Signed)
 68 y.o. female with medical history significant for pulmonary sarcoidosis, HTN, osteoarthritis, asthma and HLD who presented to the ED for evaluation of shortness of breath.    Assessment and Plan:   Community-acquired pneumonia - Patient reporting thick purulent sputum, worsening shortness of breath and cough on presentation.  Chest x-ray personally reviewed showing some consolidation in the right lobe with underlying lymphadenopathy.  Initial viral panel was negative.  Continues on empiric ceftriaxone  plus azithromycin .  Scheduled nebulizers on board.   Acute asthma exacerbation - Secondary to above.  Continues to have persistent wheezing and cough with dyspnea.  Solu-Medrol  x 1, converted to p.o. prednisone .  Scheduled nebulizers on board.   Pulmonary sarcoidosis - Known history, follows with pulmonology in the outpatient setting.  Noted bilateral lymphadenopathy personally reviewed on chest x-ray.   Conjunctivitis - Previous reported redness in the eyes with some crustiness.  Appears resolved.  No concern for bacterial conjunctivitis.   Hypertension/hyperlipidemia - Restart home medication regimen.

## 2023-12-22 NOTE — Care Management Obs Status (Signed)
 MEDICARE OBSERVATION STATUS NOTIFICATION   Patient Details  Name: Michelle Rios MRN: [id number] Date of Birth: 03-21-1956   Medicare Observation Status Notification Given:  Yes  [place]oon/Obs letter signed and copy given  [name] 12/22/2023, 2:30 PM

## 2023-12-22 NOTE — Progress Notes (Signed)
 PHARMACY - PHYSICIAN COMMUNICATION CRITICAL VALUE ALERT - BLOOD CULTURE IDENTIFICATION (BCID)  Michelle Rios is an 68 y.o. female who presented to [facility] on 12/21/2023 with a chief complaint of SOB and PNA.  Assessment:  2 of 4 bottles (1 anaerobic and 1 aerobic) is growing Staph epi, Mec A+ thus far.  Afebrile, WBC normalized, PCT negative.  Name of physician (or Provider) Contacted: Dr. Macarthur Savory   Current antibiotics: Rocephin  and azithromycin   Changes to prescribed antibiotics recommended:  Recommendations accepted by provider >> hold off on adding vanc.  MD considering repeating culture.  Results for orders placed or performed during the hospital encounter of 12/21/23  Blood Culture ID Panel (Reflexed) (Collected: 12/21/2023  5:14 PM)  Result Value Ref Range   Enterococcus faecalis NOT DETECTED NOT DETECTED   Enterococcus Faecium NOT DETECTED NOT DETECTED   Listeria monocytogenes NOT DETECTED NOT DETECTED   Staphylococcus species DETECTED (A) NOT DETECTED   Staphylococcus aureus (BCID) NOT DETECTED NOT DETECTED   Staphylococcus epidermidis DETECTED (A) NOT DETECTED   Staphylococcus lugdunensis NOT DETECTED NOT DETECTED   Streptococcus species NOT DETECTED NOT DETECTED   Streptococcus agalactiae NOT DETECTED NOT DETECTED   Streptococcus pneumoniae NOT DETECTED NOT DETECTED   Streptococcus pyogenes NOT DETECTED NOT DETECTED   A.calcoaceticus-baumannii NOT DETECTED NOT DETECTED   Bacteroides fragilis NOT DETECTED NOT DETECTED   Enterobacterales NOT DETECTED NOT DETECTED   Enterobacter cloacae complex NOT DETECTED NOT DETECTED   Escherichia coli NOT DETECTED NOT DETECTED   Klebsiella aerogenes NOT DETECTED NOT DETECTED   Klebsiella oxytoca NOT DETECTED NOT DETECTED   Klebsiella pneumoniae NOT DETECTED NOT DETECTED   Proteus species NOT DETECTED NOT DETECTED   Salmonella species NOT DETECTED NOT DETECTED   Serratia marcescens NOT DETECTED NOT DETECTED   Haemophilus  influenzae NOT DETECTED NOT DETECTED   Neisseria meningitidis NOT DETECTED NOT DETECTED   Pseudomonas aeruginosa NOT DETECTED NOT DETECTED   Stenotrophomonas maltophilia NOT DETECTED NOT DETECTED   Candida albicans NOT DETECTED NOT DETECTED   Candida auris NOT DETECTED NOT DETECTED   Candida glabrata NOT DETECTED NOT DETECTED   Candida krusei NOT DETECTED NOT DETECTED   Candida parapsilosis NOT DETECTED NOT DETECTED   Candida tropicalis NOT DETECTED NOT DETECTED   Cryptococcus neoformans/gattii NOT DETECTED NOT DETECTED   Methicillin resistance mecA/C DETECTED (A) NOT DETECTED    Abriana Saltos D. Marikay Show, PharmD, BCPS, BCCCP 12/22/2023, 4:03 PM

## 2023-12-22 NOTE — Progress Notes (Signed)
 Progress Note   Patient: Michelle Rios ZOX:[id number] DOB: May 08, 1956 DOA: 12/21/2023     0 DOS: the patient was seen and examined on 12/22/2023   Brief hospital course:  68 y.o. female with medical history significant for pulmonary sarcoidosis, HTN, osteoarthritis, asthma and HLD who presented to the ED for evaluation of shortness of breath.   Assessment and Plan:  Community-acquired pneumonia - Patient reporting thick purulent sputum, worsening shortness of breath and cough on presentation.  Chest x-ray personally reviewed showing some consolidation in the right lobe with underlying lymphadenopathy.  Initial viral panel was negative.  Continues on empiric ceftriaxone  plus azithromycin .  Scheduled nebulizers on board.  Acute asthma exacerbation - Secondary to above.  Continues to have persistent wheezing and cough with dyspnea.  Solu-Medrol  x 1, converted to p.o. prednisone .  Scheduled nebulizers on board.  Pulmonary sarcoidosis - Known history, follows with pulmonology in the outpatient setting.  Noted bilateral lymphadenopathy personally reviewed on chest x-ray.  Conjunctivitis - Previous reported redness in the eyes with some crustiness.  Appears resolved.  No concern for bacterial conjunctivitis.  Hypertension/hyperlipidemia - Restart home medication regimen.   Subjective: Patient resting comfortably in bed this morning, still having some dyspnea and cough with mild expiratory wheezing.  Slightly improved but not resolved.  Denies any fever, chest pain, nausea, vomiting, abdominal pain.  Physical Exam: Vitals:   12/21/23 1837 12/21/23 2047 12/22/23 0503 12/22/23 0718  BP:  (!) 155/96 106/73 (!) 145/86  Pulse:  92 83 86  Resp:  18 18 19   Temp: 98.5 F (36.9 C) 97.9 F (36.6 C) (!) 97.5 F (36.4 C) 97.9 F (36.6 C)  TempSrc: Oral Oral    SpO2:  95% 99% 93%  Weight:      Height:        GENERAL:  Alert, pleasant, no acute distress  HEENT:  EOMI CARDIOVASCULAR:  RRR,  no murmurs appreciated RESPIRATORY: Mild dyspnea, hacking cough, mild expiratory wheezing GASTROINTESTINAL:  Soft, nontender, nondistended EXTREMITIES:  No LE edema bilaterally NEURO:  No new focal deficits appreciated SKIN:  No rashes noted PSYCH:  Appropriate mood and affect     Data Reviewed:  Chest x-ray personally reviewed showing lymphadenopathy with some increased consolidation in the right lobes.  Previous records (including but not limited to H&P, progress notes, nursing notes, TOC management) were reviewed in assessment of this patient.  Labs: CBC: Recent Labs  Lab 12/21/23 1500 12/22/23 0911  WBC 12.3* 10.0  NEUTROABS 10.3*  --   HGB 10.9* 10.6*  HCT 33.2* 31.7*  MCV 99.4 99.1  PLT 332 345   Basic Metabolic Panel: Recent Labs  Lab 12/21/23 1500  NA 136  K 4.6  CL 97*  CO2 25  GLUCOSE 100*  BUN 9  CREATININE 0.84  CALCIUM 9.4   Liver Function Tests: Recent Labs  Lab 12/21/23 1500  AST 46*  ALT 38  ALKPHOS 115  BILITOT 1.6*  PROT 7.7  ALBUMIN  3.5   CBG: No results for input(s): "GLUCAP" in the last 168 hours.  Scheduled Meds:  atorvastatin  40 mg Oral Daily   azithromycin   500 mg Oral Daily   dextromethorphan-guaiFENesin   1 tablet Oral BID   enoxaparin  (LOVENOX ) injection  40 mg Subcutaneous Q24H   erythromycin   Both Eyes QHS   fluticasone furoate-vilanterol  1 puff Inhalation Daily   ipratropium-albuterol   3 mL Nebulization Q8H   lidocaine   1 patch Transdermal Q24H   NIFEdipine  30 mg Oral Daily  predniSONE   40 mg Oral QPC breakfast   Continuous Infusions:  cefTRIAXone  (ROCEPHIN )  IV     PRN Meds:.acetaminophen  **OR** acetaminophen , guaiFENesin , ondansetron  **OR** ondansetron  (ZOFRAN ) IV, senna-docusate  Family Communication: None at bedside  Disposition: Status is: Observation The patient will require care spanning > 2 midnights and should be moved to inpatient because: Persistent asthma exacerbation community-acquired  pneumonia with quiring IV medications     Time spent: 38 minutes  Length of stay: 0 days  Author: Jodeane Mulligan, DO 12/22/2023 9:59 AM  For on call review [email or web address].

## 2023-12-23 ENCOUNTER — Other Ambulatory Visit (HOSPITAL_COMMUNITY): Payer: Self-pay

## 2023-12-23 DIAGNOSIS — J45901 Unspecified asthma with (acute) exacerbation: Secondary | ICD-10-CM | POA: Diagnosis not present

## 2023-12-23 DIAGNOSIS — R0609 Other forms of dyspnea: Secondary | ICD-10-CM | POA: Diagnosis not present

## 2023-12-23 DIAGNOSIS — J189 Pneumonia, unspecified organism: Secondary | ICD-10-CM | POA: Diagnosis not present

## 2023-12-23 LAB — BASIC METABOLIC PANEL WITH GFR
Anion gap: 9 (ref 5–15)
BUN: 21 mg/dL (ref 8–23)
CO2: 23 mmol/L (ref 22–32)
Calcium: 9 mg/dL (ref 8.9–10.3)
Chloride: 102 mmol/L (ref 98–111)
Creatinine, Ser: 0.89 mg/dL (ref 0.44–1.00)
GFR, Estimated: >60 mL/min (ref >60)
Glucose, Bld: 115 mg/dL — ABNORMAL HIGH (ref 70–99)
Potassium: 4.4 mmol/L (ref 3.5–5.1)
Sodium: 134 mmol/L — ABNORMAL LOW (ref 135–145)

## 2023-12-23 LAB — CBC
HCT: 33.4 % — ABNORMAL LOW (ref 36.0–46.0)
Hemoglobin: 10.8 g/dL — ABNORMAL LOW (ref 12.0–15.0)
MCH: 32.5 pg (ref 26.0–34.0)
MCHC: 32.3 g/dL (ref 30.0–36.0)
MCV: 100.6 fL — ABNORMAL HIGH (ref 80.0–100.0)
Platelets: 269 10*3/uL (ref 150–400)
RBC: 3.32 MIL/uL — ABNORMAL LOW (ref 3.87–5.11)
RDW: 11.8 % (ref 11.5–15.5)
WBC: 11.7 10*3/uL — ABNORMAL HIGH (ref 4.0–10.5)
nRBC: 0 % (ref 0.0–0.2)

## 2023-12-23 MED ORDER — PREDNISONE 20 MG PO TABS
40.0000 mg | ORAL_TABLET | Freq: Every day | ORAL | 0 refills | Status: DC
  Filled 2023-12-23: qty 10, 5d supply, fill #0

## 2023-12-23 MED ORDER — KETOROLAC TROMETHAMINE 15 MG/ML IJ SOLN
15.0000 mg | Freq: Once | INTRAMUSCULAR | Status: AC
  Administered 2023-12-23: 15 mg via INTRAVENOUS
  Filled 2023-12-23: qty 1

## 2023-12-23 MED ORDER — OXYCODONE HCL 5 MG PO TABS
5.0000 mg | ORAL_TABLET | Freq: Four times a day (QID) | ORAL | 0 refills | Status: DC | PRN
  Filled 2023-12-23: qty 14, 4d supply, fill #0

## 2023-12-23 MED ORDER — ALBUTEROL SULFATE HFA 108 (90 BASE) MCG/ACT IN AERS
2.0000 | INHALATION_SPRAY | Freq: Four times a day (QID) | RESPIRATORY_TRACT | 2 refills | Status: DC | PRN
  Filled 2023-12-23: qty 6.7, 30d supply, fill #0

## 2023-12-23 MED ORDER — OXYCODONE HCL 5 MG PO TABS
5.0000 mg | ORAL_TABLET | Freq: Once | ORAL | Status: AC | PRN
  Administered 2023-12-23: 5 mg via ORAL
  Filled 2023-12-23: qty 1

## 2023-12-23 MED ORDER — OXYCODONE HCL 5 MG PO TABS: 5.0000 mg | ORAL_TABLET | Freq: Four times a day (QID) | ORAL | Status: DC | PRN

## 2023-12-23 MED ORDER — GUAIFENESIN 100 MG/5ML PO LIQD
5.0000 mL | ORAL | 0 refills | Status: DC | PRN
  Filled 2023-12-23: qty 118, 4d supply, fill #0

## 2023-12-23 MED ORDER — IPRATROPIUM-ALBUTEROL 0.5-2.5 (3) MG/3ML IN SOLN: 3.0000 mL | RESPIRATORY_TRACT | Status: DC | PRN

## 2023-12-23 MED ORDER — AMOXICILLIN-POT CLAVULANATE 875-125 MG PO TABS
1.0000 | ORAL_TABLET | Freq: Two times a day (BID) | ORAL | 0 refills | Status: DC
Start: 2023-12-23 — End: 2024-01-03
  Filled 2023-12-23: qty 10, 5d supply, fill #0

## 2023-12-23 MED ORDER — AZITHROMYCIN 500 MG PO TABS
500.0000 mg | ORAL_TABLET | Freq: Every day | ORAL | 0 refills | Status: AC
  Filled 2023-12-23: qty 3, 3d supply, fill #0

## 2023-12-23 NOTE — Plan of Care (Signed)

## 2023-12-23 NOTE — Discharge Summary (Signed)
 Physician Discharge Summary   Patient: Michelle Rios MRN: [id number] DOB: 08-23-1955  Admit date:     12/21/2023  Discharge date: 12/23/23  Discharge Physician: Jodeane Mulligan   PCP: [facility]   Recommendations at discharge:    Pt to be discharged home.   If you experience worsening fever, chills, chest pain, shortness of breath, or other concerning symptoms, please call your PCP or go to the emergency department immediately.  Discharge Diagnoses: Principal Problem:   Community acquired pneumonia Active Problems:   Dyspnea   Asthma, chronic, unspecified asthma severity, with acute exacerbation  Resolved Problems:   * No resolved hospital problems. *   Hospital Course:  68 y.o. female with medical history significant for pulmonary sarcoidosis, HTN, osteoarthritis, asthma and HLD who presented to the ED for evaluation of shortness of breath.    Assessment and Plan:   Community-acquired pneumonia - Patient reporting thick purulent sputum, worsening shortness of breath and cough on presentation.  Chest x-ray personally reviewed showing some consolidation in the right lobe with underlying lymphadenopathy.  Initial viral panel was negative.  Received empiric ceftriaxone  plus azithromycin .  Scheduled nebulizers on board.  Showed improvement in dyspnea and wheezing.  No hypoxia.  Will transition patient to p.o. Augmentin plus azithromycin  to take as directed upon discharge.  Pleuritic chest pain (POA) - Mostly on right side, likely from above.  IV Toradol  x 1.  Patient okay to take ibuprofen  as directed.  P.o. prednisone  on board as well as below.   Acute asthma exacerbation - Secondary to above.  Presented with persistent wheezing and cough with dyspnea.  Solu-Medrol  x 1, converted to p.o. prednisone .  Scheduled nebulizers on board.  Wheezing nearly resolved.  Will transition over to antibiotics as above as well as p.o. prednisone  to take for the next 5 days upon discharge.   Will also give prescription for albuterol  MDI.   Pulmonary sarcoidosis - Known history, unclear if patient follows with pulmonology in the outpatient setting.  Noted bilateral lymphadenopathy personally reviewed on chest x-ray.  Will provide referral to reestablish with pulmonology.   Conjunctivitis - Previous reported redness in the eyes with some crustiness.  Appears resolved.  No concern for bacterial conjunctivitis.   Hypertension/hyperlipidemia - Restart home medication regimen.   Consultants: None Procedures performed: None Disposition: Home Diet recommendation:  Discharge Diet Orders (From admission, onward)     Start     Ordered   12/23/23 0000  Diet - low sodium heart healthy        12/23/23 0930           Cardiac diet  DISCHARGE MEDICATION: Allergies as of 12/23/2023       Reactions   Ivp Dye [iodinated Contrast Media] Anaphylaxis, Swelling   Voltaren  [diclofenac  Sodium] Nausea And Vomiting, Other (See Comments)   Bad taste in mouth Mouth and tongue turned black Vomiting thick, white mucous         Medication List     TAKE these medications    albuterol  108 (90 Base) MCG/ACT inhaler Commonly known as: VENTOLIN  HFA Inhale 2 puffs into the lungs every 6 (six) hours as needed for wheezing or shortness of breath.   amoxicillin-clavulanate 875-125 MG tablet Commonly known as: AUGMENTIN Take 1 tablet by mouth 2 (two) times daily.   atorvastatin 40 MG tablet Commonly known as: LIPITOR Take 40 mg by mouth daily.   azithromycin  500 MG tablet Commonly known as: Zithromax  Take 1 tablet (500 mg total)  by mouth daily for 3 days.   guaiFENesin  100 MG/5ML liquid Commonly known as: ROBITUSSIN Take 5 mLs by mouth every 4 (four) hours as needed for cough or to loosen phlegm.   nabumetone  500 MG tablet Commonly known as: RELAFEN  Take 1 tablet (500 mg total) by mouth daily.   NIFEdipine  30 MG 24 hr tablet Commonly known as: PROCARDIA -XL/NIFEDICAL-XL Take  30 mg by mouth daily.   oxyCODONE  5 MG immediate release tablet Commonly known as: Oxy IR/ROXICODONE  Take 1 tablet (5 mg total) by mouth every 6 (six) hours as needed for severe pain (pain score 7-10).   predniSONE  20 MG tablet Commonly known as: DELTASONE  Take 2 tablets (40 mg total) by mouth daily after breakfast. Start taking on: Dec 24, 2023         Discharge Exam: Michelle Rios Weights   12/21/23 1351  Weight: 117.9 kg    GENERAL:  Alert, pleasant, no acute distress  HEENT:  EOMI CARDIOVASCULAR:  RRR, no murmurs appreciated RESPIRATORY: Trace expiratory wheezing GASTROINTESTINAL:  Soft, nontender, nondistended EXTREMITIES:  No LE edema bilaterally NEURO:  No new focal deficits appreciated SKIN:  No rashes noted PSYCH:  Appropriate mood and affect     Condition at discharge: improving  The results of significant diagnostics from this hospitalization (including imaging, microbiology, ancillary and laboratory) are listed below for reference.   Imaging Studies: CT CHEST WO CONTRAST Result Date: 12/21/2023 CLINICAL DATA:  Respiratory illness. EXAM: CT CHEST WITHOUT CONTRAST TECHNIQUE: Multidetector CT imaging of the chest was performed following the standard protocol without IV contrast. RADIATION DOSE REDUCTION: This exam was performed according to the departmental dose-optimization program which includes automated exposure control, adjustment of the mA and/or kV according to patient size and/or use of iterative reconstruction technique. COMPARISON:  Chest radiograph dated 12/21/2023. Chest CT dated 04/19/2022. FINDINGS: Evaluation of this exam is limited in the absence of intravenous contrast. Cardiovascular: There is no cardiomegaly or pericardial effusion. Mild atherosclerotic calcification of the thoracic aorta. No aneurysmal dilatation. The central pulmonary arteries are grossly unremarkable. Mediastinum/Nodes: No obvious hilar or mediastinal adenopathy. Evaluation however is  limited in the absence of intravenous contrast and due to respiratory motion. The esophagus is grossly unremarkable. No mediastinal fluid collection. Lungs/Pleura: Consolidative changes of the right upper lobe extending into the right hilum may be infectious in etiology. Underlying mass is not excluded clinical correlation and close follow-up after treatment recommended to document resolution. Probable trace left pleural effusion or left lung base atelectasis. There is no pneumothorax. The central airways are patent. Upper Abdomen: No acute abnormality. Musculoskeletal: Degenerative changes of the spine. No acute osseous pathology. IMPRESSION: 1. Right upper lobe pneumonia versus mass. Clinical correlation and close follow-up after treatment recommended to document resolution. 2.  Aortic Atherosclerosis (ICD10-I70.0). Electronically Signed   By: Angus Bark M.D.   On: 12/21/2023 17:56   DG Chest Portable 1 View Result Date: 12/21/2023 CLINICAL DATA:  SOB EXAM: PORTABLE CHEST - 1 VIEW COMPARISON:  Dec 21, 2023 FINDINGS: Lower lung volumes. Unchanged masslike opacity in the right suprahilar region. Streaky opacities in both lung bases, which may represent atelectasis. No focal airspace consolidation, pleural effusion, or pneumothorax. No cardiomegaly. Tortuous aorta with aortic atherosclerosis. No acute fracture or destructive lesion. Multilevel thoracic osteophytosis. Bilateral glenohumeral joint osteoarthritis. IMPRESSION: Low lung volumes. Unchanged masslike opacity in the right suprahilar region. A chest CT with IV contrast is recommended for further characterization. Electronically Signed   By: Rance Burrows M.D.   On: 12/21/2023  16:13   DG Chest 2 View Result Date: 12/21/2023 CLINICAL DATA:  Productive cough EXAM: CHEST - 2 VIEW COMPARISON:  Chest radiograph 04/19/2022 FINDINGS: Stable cardiac contours. Masslike enlargement of the right hilar region. Additionally there is focal consolidation/mass  within the right mid lung. No pleural effusion or pneumothorax. Thoracic spine degenerative changes. IMPRESSION: Masslike enlargement of the right hilar region. Considerations include prominent hilar structures versus adenopathy versus mass. Additionally there is focal consolidation/mass within the right mid lung. Findings are nonspecific in etiology. Considerations include infection or underlying mass lesion. Given the complex findings, recommend further evaluation with contrast-enhanced chest CT. Electronically Signed   By: Jone Neither M.D.   On: 12/21/2023 14:53    Microbiology: Results for orders placed or performed during the hospital encounter of 12/21/23  Resp panel by RT-PCR (RSV, Flu A&B, Covid) Anterior Nasal Swab     Status: None   Collection Time: 12/21/23  2:42 PM   Specimen: Anterior Nasal Swab  Result Value Ref Range Status   SARS Coronavirus 2 by RT PCR NEGATIVE NEGATIVE Final   Influenza A by PCR NEGATIVE NEGATIVE Final   Influenza B by PCR NEGATIVE NEGATIVE Final    Comment: (NOTE) The Xpert Xpress SARS-CoV-2/FLU/RSV plus assay is intended as an aid in the diagnosis of influenza from Nasopharyngeal swab specimens and should not be used as a sole basis for treatment. Nasal washings and aspirates are unacceptable for Xpert Xpress SARS-CoV-2/FLU/RSV testing.  Fact Sheet for Patients: BloggerCourse.com  Fact Sheet for Healthcare Providers: SeriousBroker.it  This test is not yet approved or cleared by the [place]  FDA and has been authorized for detection and/or diagnosis of SARS-CoV-2 by FDA under an Emergency Use Authorization (EUA). This EUA will remain in effect (meaning this test can be used) for the duration of the COVID-19 declaration under Section 564(b)(1) of the Act, 21 U.S.C. section 360bbb-3(b)(1), unless the authorization is terminated or revoked.     Resp Syncytial Virus by PCR NEGATIVE NEGATIVE Final     Comment: (NOTE) Fact Sheet for Patients: BloggerCourse.com  Fact Sheet for Healthcare Providers: SeriousBroker.it  This test is not yet approved or cleared by the [place]  FDA and has been authorized for detection and/or diagnosis of SARS-CoV-2 by FDA under an Emergency Use Authorization (EUA). This EUA will remain in effect (meaning this test can be used) for the duration of the COVID-19 declaration under Section 564(b)(1) of the Act, 21 U.S.C. section 360bbb-3(b)(1), unless the authorization is terminated or revoked.  Performed at [facility], [address], [place], [place] [address]   Culture, blood (routine x 2)     Status: None (Preliminary result)   Collection Time: 12/21/23  5:14 PM   Specimen: BLOOD RIGHT HAND  Result Value Ref Range Status   Specimen Description BLOOD RIGHT HAND  Final   Special Requests   Final    BOTTLES DRAWN AEROBIC AND ANAEROBIC Blood Culture results may not be optimal due to an inadequate volume of blood received in culture bottles   Culture  Setup Time   Final    GRAM POSITIVE COCCI ANAEROBIC BOTTLE ONLY CRITICAL RESULT CALLED TO, READ BACK BY AND VERIFIED WITH: PHARMD T. DANG [id number] @ 1533 FH    Culture   Final    GRAM POSITIVE COCCI IDENTIFICATION AND SUSCEPTIBILITIES TO FOLLOW Performed at [facility], [address], [place], [place] [address]    Report Status PENDING  Incomplete  Blood Culture ID Panel (Reflexed)  Status: Abnormal   Collection Time: 12/21/23  5:14 PM  Result Value Ref Range Status   Enterococcus faecalis NOT DETECTED NOT DETECTED Final   Enterococcus Faecium NOT DETECTED NOT DETECTED Final   Listeria monocytogenes NOT DETECTED NOT DETECTED Final   Staphylococcus species DETECTED (A) NOT DETECTED Final    Comment: CRITICAL RESULT CALLED TO, READ BACK BY AND VERIFIED WITH: PHARMD T. DANG [id number] @ 1533 FH    Staphylococcus aureus (BCID) NOT  DETECTED NOT DETECTED Final   Staphylococcus epidermidis DETECTED (A) NOT DETECTED Final    Comment: Methicillin (oxacillin) resistant coagulase negative staphylococcus. Possible blood culture contaminant (unless isolated from more than one blood culture draw or clinical case suggests pathogenicity). No antibiotic treatment is indicated for blood  culture contaminants. CRITICAL RESULT CALLED TO, READ BACK BY AND VERIFIED WITH: PHARMD T. DANG [id number] @ 1533 FH    Staphylococcus lugdunensis NOT DETECTED NOT DETECTED Final   Streptococcus species NOT DETECTED NOT DETECTED Final   Streptococcus agalactiae NOT DETECTED NOT DETECTED Final   Streptococcus pneumoniae NOT DETECTED NOT DETECTED Final   Streptococcus pyogenes NOT DETECTED NOT DETECTED Final   A.calcoaceticus-baumannii NOT DETECTED NOT DETECTED Final   Bacteroides fragilis NOT DETECTED NOT DETECTED Final   Enterobacterales NOT DETECTED NOT DETECTED Final   Enterobacter cloacae complex NOT DETECTED NOT DETECTED Final   Escherichia coli NOT DETECTED NOT DETECTED Final   Klebsiella aerogenes NOT DETECTED NOT DETECTED Final   Klebsiella oxytoca NOT DETECTED NOT DETECTED Final   Klebsiella pneumoniae NOT DETECTED NOT DETECTED Final   Proteus species NOT DETECTED NOT DETECTED Final   Salmonella species NOT DETECTED NOT DETECTED Final   Serratia marcescens NOT DETECTED NOT DETECTED Final   Haemophilus influenzae NOT DETECTED NOT DETECTED Final   Neisseria meningitidis NOT DETECTED NOT DETECTED Final   Pseudomonas aeruginosa NOT DETECTED NOT DETECTED Final   Stenotrophomonas maltophilia NOT DETECTED NOT DETECTED Final   Candida albicans NOT DETECTED NOT DETECTED Final   Candida auris NOT DETECTED NOT DETECTED Final   Candida glabrata NOT DETECTED NOT DETECTED Final   Candida krusei NOT DETECTED NOT DETECTED Final   Candida parapsilosis NOT DETECTED NOT DETECTED Final   Candida tropicalis NOT DETECTED NOT DETECTED Final   Cryptococcus  neoformans/gattii NOT DETECTED NOT DETECTED Final   Methicillin resistance mecA/C DETECTED (A) NOT DETECTED Final    Comment: CRITICAL RESULT CALLED TO, READ BACK BY AND VERIFIED WITH: PHARMD T. Marikay Show [id number] @ [phone] FH Performed at [facility], [address] [place], [place] [address]   Culture, blood (routine x 2)     Status: None (Preliminary result)   Collection Time: 12/21/23  5:19 PM   Specimen: BLOOD RIGHT ARM  Result Value Ref Range Status   Specimen Description BLOOD RIGHT ARM  Final   Special Requests   Final    BOTTLES DRAWN AEROBIC ONLY Blood Culture results may not be optimal due to an inadequate volume of blood received in culture bottles   Culture  Setup Time   Final    GRAM POSITIVE COCCI IN CLUSTERS AEROBIC BOTTLE ONLY CRITICAL RESULT CALLED TO, READ BACK BY AND VERIFIED WITH: PHARMD T. DANG [id number] @ 1533 FH    Culture   Final    GRAM POSITIVE COCCI IN CLUSTERS IDENTIFICATION TO FOLLOW Performed at [facility], [address] [place], [place] [address]    Report Status PENDING  Incomplete  MRSA Next Gen by PCR, Nasal  Status: None   Collection Time: 12/21/23  7:16 PM   Specimen: Nasal Mucosa; Nasal Swab  Result Value Ref Range Status   MRSA by PCR Next Gen NOT DETECTED NOT DETECTED Final    Comment: (NOTE) The GeneXpert MRSA Assay (FDA approved for NASAL specimens only), is one component of a comprehensive MRSA colonization surveillance program. It is not intended to diagnose MRSA infection nor to guide or monitor treatment for MRSA infections. Test performance is not FDA approved in patients less than 59 years old. Performed at [facility], [address] [place], [place] [address]   Respiratory (~20 pathogens) panel by PCR     Status: None   Collection Time: 12/21/23  7:17 PM   Specimen: Nasal Mucosa; Respiratory  Result Value Ref Range Status   Adenovirus NOT DETECTED NOT DETECTED Final   Coronavirus 229E NOT DETECTED NOT  DETECTED Final    Comment: (NOTE) The Coronavirus on the Respiratory Panel, DOES NOT test for the novel  Coronavirus (2019 nCoV)    Coronavirus HKU1 NOT DETECTED NOT DETECTED Final   Coronavirus NL63 NOT DETECTED NOT DETECTED Final   Coronavirus OC43 NOT DETECTED NOT DETECTED Final   Metapneumovirus NOT DETECTED NOT DETECTED Final   Rhinovirus / Enterovirus NOT DETECTED NOT DETECTED Final   Influenza A NOT DETECTED NOT DETECTED Final   Influenza B NOT DETECTED NOT DETECTED Final   Parainfluenza Virus 1 NOT DETECTED NOT DETECTED Final   Parainfluenza Virus 2 NOT DETECTED NOT DETECTED Final   Parainfluenza Virus 3 NOT DETECTED NOT DETECTED Final   Parainfluenza Virus 4 NOT DETECTED NOT DETECTED Final   Respiratory Syncytial Virus NOT DETECTED NOT DETECTED Final   Bordetella pertussis NOT DETECTED NOT DETECTED Final   Bordetella Parapertussis NOT DETECTED NOT DETECTED Final   Chlamydophila pneumoniae NOT DETECTED NOT DETECTED Final   Mycoplasma pneumoniae NOT DETECTED NOT DETECTED Final    Comment: Performed at [facility], [address] [place], [place] [address]    Labs: CBC: Recent Labs  Lab 12/21/23 1500 12/22/23 0911 12/23/23 0900  WBC 12.3* 10.0 11.7*  NEUTROABS 10.3*  --   --   HGB 10.9* 10.6* 10.8*  HCT 33.2* 31.7* 33.4*  MCV 99.4 99.1 100.6*  PLT 332 345 269   Basic Metabolic Panel: Recent Labs  Lab 12/21/23 1500 12/22/23 0911  NA 136 139  K 4.6 4.1  CL 97* 99  CO2 25 26  GLUCOSE 100* 148*  BUN 9 13  CREATININE 0.84 0.84  CALCIUM 9.4 9.3   Liver Function Tests: Recent Labs  Lab 12/21/23 1500  AST 46*  ALT 38  ALKPHOS 115  BILITOT 1.6*  PROT 7.7  ALBUMIN  3.5   CBG: No results for input(s): "GLUCAP" in the last 168 hours.  Discharge time spent: 25 minutes.  Length of stay: 0 days  Signed: Jodeane Mulligan, DO [facility] 12/23/2023

## 2023-12-23 NOTE — Progress Notes (Signed)
 DISCHARGE NOTE HOME Michelle Rios to be discharged Home per MD order. Discussed prescriptions and follow up appointments with the patient. Prescriptions given to patient; medication list explained in detail. Patient verbalized understanding.  Skin clean, dry and intact without evidence of skin break down, no evidence of skin tears noted. IV catheter discontinued intact. Site without signs and symptoms of complications. Dressing and pressure applied. Pt denies pain at the site currently. No complaints noted.  Patient free of lines, drains, and wounds.   An After Visit Summary (AVS) was printed and given to the patient. Patient escorted via wheelchair, and discharged home via private auto.  [name], RN

## 2023-12-24 LAB — CULTURE, BLOOD (ROUTINE X 2)

## 2024-01-03 ENCOUNTER — Other Ambulatory Visit: Payer: Self-pay

## 2024-01-03 ENCOUNTER — Inpatient Hospital Stay (HOSPITAL_COMMUNITY)
Admission: EM | Admit: 2024-01-03 | Discharge: 2024-01-09 | DRG: 202 | Disposition: A | Discharge status: Home / Self Care | Source: Ambulatory Visit | Attending: Family Medicine | Admitting: Family Medicine

## 2024-01-03 ENCOUNTER — Encounter (HOSPITAL_COMMUNITY): Payer: Self-pay | Admitting: Internal Medicine

## 2024-01-03 ENCOUNTER — Emergency Department (HOSPITAL_COMMUNITY)

## 2024-01-03 DIAGNOSIS — Z79899 Other long term (current) drug therapy: Secondary | ICD-10-CM

## 2024-01-03 DIAGNOSIS — Z833 Family history of diabetes mellitus: Secondary | ICD-10-CM

## 2024-01-03 DIAGNOSIS — Z8051 Family history of malignant neoplasm of kidney: Secondary | ICD-10-CM

## 2024-01-03 DIAGNOSIS — D638 Anemia in other chronic diseases classified elsewhere: Secondary | ICD-10-CM | POA: Diagnosis not present

## 2024-01-03 DIAGNOSIS — E041 Nontoxic single thyroid nodule: Secondary | ICD-10-CM | POA: Diagnosis present

## 2024-01-03 DIAGNOSIS — I890 Lymphedema, not elsewhere classified: Secondary | ICD-10-CM | POA: Diagnosis present

## 2024-01-03 DIAGNOSIS — Z7952 Long term (current) use of systemic steroids: Secondary | ICD-10-CM

## 2024-01-03 DIAGNOSIS — Z841 Family history of disorders of kidney and ureter: Secondary | ICD-10-CM

## 2024-01-03 DIAGNOSIS — M069 Rheumatoid arthritis, unspecified: Secondary | ICD-10-CM | POA: Diagnosis present

## 2024-01-03 DIAGNOSIS — E66813 Obesity, class 3: Secondary | ICD-10-CM | POA: Diagnosis present

## 2024-01-03 DIAGNOSIS — Z8249 Family history of ischemic heart disease and other diseases of the circulatory system: Secondary | ICD-10-CM

## 2024-01-03 DIAGNOSIS — Z811 Family history of alcohol abuse and dependence: Secondary | ICD-10-CM

## 2024-01-03 DIAGNOSIS — J45901 Unspecified asthma with (acute) exacerbation: Secondary | ICD-10-CM | POA: Diagnosis not present

## 2024-01-03 DIAGNOSIS — E7849 Other hyperlipidemia: Secondary | ICD-10-CM

## 2024-01-03 DIAGNOSIS — D860 Sarcoidosis of lung: Secondary | ICD-10-CM | POA: Diagnosis present

## 2024-01-03 DIAGNOSIS — Z86711 Personal history of pulmonary embolism: Secondary | ICD-10-CM

## 2024-01-03 DIAGNOSIS — Z823 Family history of stroke: Secondary | ICD-10-CM

## 2024-01-03 DIAGNOSIS — Z83438 Family history of other disorder of lipoprotein metabolism and other lipidemia: Secondary | ICD-10-CM

## 2024-01-03 DIAGNOSIS — Z886 Allergy status to analgesic agent status: Secondary | ICD-10-CM

## 2024-01-03 DIAGNOSIS — I10 Essential (primary) hypertension: Secondary | ICD-10-CM | POA: Diagnosis present

## 2024-01-03 DIAGNOSIS — Z803 Family history of malignant neoplasm of breast: Secondary | ICD-10-CM

## 2024-01-03 DIAGNOSIS — R062 Wheezing: Principal | ICD-10-CM

## 2024-01-03 DIAGNOSIS — E785 Hyperlipidemia, unspecified: Secondary | ICD-10-CM | POA: Diagnosis present

## 2024-01-03 DIAGNOSIS — Z91041 Radiographic dye allergy status: Secondary | ICD-10-CM

## 2024-01-03 DIAGNOSIS — Z6841 Body Mass Index (BMI) 40.0 and over, adult: Secondary | ICD-10-CM

## 2024-01-03 DIAGNOSIS — Z90710 Acquired absence of both cervix and uterus: Secondary | ICD-10-CM

## 2024-01-03 DIAGNOSIS — Z1152 Encounter for screening for COVID-19: Secondary | ICD-10-CM

## 2024-01-03 LAB — COMPREHENSIVE METABOLIC PANEL WITH GFR
ALT: 27 U/L (ref 0–44)
AST: 37 U/L (ref 15–41)
Albumin: 3.5 g/dL (ref 3.5–5.0)
Alkaline Phosphatase: 98 U/L (ref 38–126)
Anion gap: 14 (ref 5–15)
BUN: 20 mg/dL (ref 8–23)
CO2: 22 mmol/L (ref 22–32)
Calcium: 8.9 mg/dL (ref 8.9–10.3)
Chloride: 101 mmol/L (ref 98–111)
Creatinine, Ser: 1.06 mg/dL — ABNORMAL HIGH (ref 0.44–1.00)
GFR, Estimated: 58 mL/min — ABNORMAL LOW (ref >60)
Glucose, Bld: 229 mg/dL — ABNORMAL HIGH (ref 70–99)
Potassium: 4.4 mmol/L (ref 3.5–5.1)
Sodium: 137 mmol/L (ref 135–145)
Total Bilirubin: 0.3 mg/dL (ref 0.0–1.2)
Total Protein: 7.4 g/dL (ref 6.5–8.1)

## 2024-01-03 LAB — CBC WITH DIFFERENTIAL/PLATELET
Abs Immature Granulocytes: 0.04 10*3/uL (ref 0.00–0.07)
Basophils Absolute: 0 10*3/uL (ref 0.0–0.1)
Basophils Relative: 0 %
Eosinophils Absolute: 0 10*3/uL (ref 0.0–0.5)
Eosinophils Relative: 0 %
HCT: 34.3 % — ABNORMAL LOW (ref 36.0–46.0)
Hemoglobin: 11.6 g/dL — ABNORMAL LOW (ref 12.0–15.0)
Immature Granulocytes: 1 %
Lymphocytes Relative: 12 %
Lymphs Abs: 0.9 10*3/uL (ref 0.7–4.0)
MCH: 35.2 pg — ABNORMAL HIGH (ref 26.0–34.0)
MCHC: 33.8 g/dL (ref 30.0–36.0)
MCV: 103.9 fL — ABNORMAL HIGH (ref 80.0–100.0)
Monocytes Absolute: 0.1 10*3/uL (ref 0.1–1.0)
Monocytes Relative: 1 %
Neutro Abs: 6.2 10*3/uL (ref 1.7–7.7)
Neutrophils Relative %: 86 %
Platelets: 369 10*3/uL (ref 150–400)
RBC: 3.3 MIL/uL — ABNORMAL LOW (ref 3.87–5.11)
RDW: 12.1 % (ref 11.5–15.5)
WBC: 7.3 10*3/uL (ref 4.0–10.5)
nRBC: 0 % (ref 0.0–0.2)

## 2024-01-03 MED ORDER — NIFEDIPINE ER OSMOTIC RELEASE 30 MG PO TB24
30.0000 mg | ORAL_TABLET | Freq: Every day | ORAL | Status: DC
  Administered 2024-01-04 – 2024-01-09 (×6): 30 mg via ORAL
  Filled 2024-01-03 (×6): qty 1

## 2024-01-03 MED ORDER — ATORVASTATIN CALCIUM 40 MG PO TABS
40.0000 mg | ORAL_TABLET | Freq: Every day | ORAL | Status: DC
  Administered 2024-01-03 – 2024-01-09 (×7): 40 mg via ORAL
  Filled 2024-01-03 (×7): qty 1

## 2024-01-03 MED ORDER — IPRATROPIUM BROMIDE 0.02 % IN SOLN
0.5000 mg | Freq: Four times a day (QID) | RESPIRATORY_TRACT | Status: DC
  Administered 2024-01-03 – 2024-01-04 (×3): 0.5 mg via RESPIRATORY_TRACT
  Filled 2024-01-03 (×3): qty 2.5

## 2024-01-03 MED ORDER — ENOXAPARIN SODIUM 40 MG/0.4ML IJ SOSY
40.0000 mg | PREFILLED_SYRINGE | INTRAMUSCULAR | Status: DC
  Administered 2024-01-03 – 2024-01-08 (×6): 40 mg via SUBCUTANEOUS
  Filled 2024-01-03 (×6): qty 0.4

## 2024-01-03 MED ORDER — GUAIFENESIN 100 MG/5ML PO LIQD
5.0000 mL | ORAL | Status: DC | PRN
  Administered 2024-01-03 – 2024-01-07 (×6): 5 mL via ORAL
  Filled 2024-01-03 (×6): qty 15

## 2024-01-03 MED ORDER — ALBUTEROL SULFATE (2.5 MG/3ML) 0.083% IN NEBU
2.5000 mg | INHALATION_SOLUTION | RESPIRATORY_TRACT | Status: DC | PRN
  Administered 2024-01-03 – 2024-01-04 (×2): 2.5 mg via RESPIRATORY_TRACT
  Filled 2024-01-03 (×2): qty 3

## 2024-01-03 MED ORDER — PREDNISONE 5 MG PO TABS
50.0000 mg | ORAL_TABLET | Freq: Every day | ORAL | Status: DC
  Administered 2024-01-04 – 2024-01-09 (×6): 50 mg via ORAL
  Filled 2024-01-03 (×6): qty 2

## 2024-01-03 MED ORDER — ALBUTEROL SULFATE (2.5 MG/3ML) 0.083% IN NEBU
5.0000 mg | INHALATION_SOLUTION | Freq: Once | RESPIRATORY_TRACT | Status: AC
  Administered 2024-01-03: 5 mg via RESPIRATORY_TRACT
  Filled 2024-01-03: qty 6

## 2024-01-03 MED ORDER — ACETAMINOPHEN 325 MG PO TABS
650.0000 mg | ORAL_TABLET | Freq: Four times a day (QID) | ORAL | Status: DC | PRN
  Administered 2024-01-04 – 2024-01-06 (×2): 650 mg via ORAL
  Filled 2024-01-03 (×2): qty 2

## 2024-01-03 MED ORDER — ACETAMINOPHEN 650 MG RE SUPP: 650.0000 mg | Freq: Four times a day (QID) | RECTAL | Status: DC | PRN

## 2024-01-03 MED ORDER — SODIUM CHLORIDE 0.9% FLUSH
3.0000 mL | Freq: Two times a day (BID) | INTRAVENOUS | Status: DC
  Administered 2024-01-03 – 2024-01-07 (×9): 3 mL via INTRAVENOUS

## 2024-01-03 NOTE — ED Provider Notes (Signed)
 [place] EMERGENCY DEPARTMENT AT [facility] Provider Note   CSN: [id number] Arrival date & time: 01/03/24  1508     History  Chief Complaint  Patient presents with   Shortness of Breath    Michelle Rios is a 68 y.o. female.   Shortness of Breath Patient with history of sarcoidosis.  More short of breath.  Reportedly came from [employer]  Sats in the 90s.  Not on oxygen at baseline.  Has had a cough.  States more short of breath.  Recent admission for same and had been on antibiotics and steroids.  States she is still on prednisone .  Received Solu-Medrol  and breathing treatment at [facility].    Past Medical History:  Diagnosis Date   AKI (acute kidney injury) (HCC) 09/15/2015   Anemia    Arthritis    "knees" (02/20/2018)   Asthma    Bilateral swelling of feet    Constipation    Daily headache    Diarrhea 09/15/2015   Hiatal hernia    History of blood transfusion 1979   w/childbirth   Hypertension    Lactose intolerance    Osteoarthritis    Pulmonary embolism (HCC) 1990s X 1   Rheumatoid arthritis (HCC)    Sarcoidosis of lung (HCC)    SOB (shortness of breath)     Home Medications Prior to Admission medications   Medication Sig Start Date End Date Taking? Authorizing Provider  albuterol  (VENTOLIN  HFA) 108 (90 Base) MCG/ACT inhaler Inhale 2 puffs into the lungs every 6 (six) hours as needed for wheezing or shortness of breath. 12/23/23   Jodeane Mulligan, DO  amoxicillin -clavulanate (AUGMENTIN ) 875-125 MG tablet Take 1 tablet by mouth 2 (two) times daily. 12/23/23   Jodeane Mulligan, DO  atorvastatin  (LIPITOR) 40 MG tablet Take 40 mg by mouth daily. 11/16/23   [provider]  guaiFENesin  (ROBITUSSIN) 100 MG/5ML liquid Take 5 mLs by mouth every 4 (four) hours as needed for cough or to loosen phlegm. 12/23/23   Jodeane Mulligan, DO  nabumetone  (RELAFEN ) 500 MG tablet Take 1 tablet (500 mg total) by mouth daily. 11/01/23   Zamora,  Erin R, NP  NIFEdipine  (PROCARDIA -XL/NIFEDICAL-XL) 30 MG 24 hr tablet Take 30 mg by mouth daily.    [provider]  oxyCODONE  (OXY IR/ROXICODONE ) 5 MG immediate release tablet Take 1 tablet (5 mg total) by mouth every 6 (six) hours as needed for severe pain (pain score 7-10). 12/23/23   Jodeane Mulligan, DO  predniSONE  (DELTASONE ) 20 MG tablet Take 2 tablets (40 mg total) by mouth daily after breakfast. 12/24/23   Jodeane Mulligan, DO      Allergies    Ivp dye [iodinated contrast media] and Voltaren  [diclofenac  sodium]    Review of Systems   Review of Systems  Respiratory:  Positive for shortness of breath.     Physical Exam Updated Vital Signs BP 132/75 (BP Location: Right Arm)   Pulse 98   Temp 98.4 F (36.9 C) (Oral)   Resp 20   Ht 5\' 1"  (1.549 m)   Wt 117 kg   SpO2 100%   BMI 48.74 kg/m  Physical Exam Vitals and nursing note reviewed.  Cardiovascular:     Rate and Rhythm: Tachycardia present.  Pulmonary:     Comments: Diffuse harsh breath sounds with some wheezes. Abdominal:     Tenderness: There is no abdominal tenderness.  Musculoskeletal:     Right lower leg:  No edema.     Left lower leg: No edema.  Neurological:     Mental Status: She is alert.     ED Results / Procedures / Treatments   Labs (all labs ordered are listed, but only abnormal results are displayed) Labs Reviewed  COMPREHENSIVE METABOLIC PANEL WITH GFR - Abnormal; Notable for the following components:      Result Value   Glucose, Bld 229 (*)    Creatinine, Ser 1.06 (*)    GFR, Estimated 58 (*)    All other components within normal limits  CBC WITH DIFFERENTIAL/PLATELET - Abnormal; Notable for the following components:   RBC 3.30 (*)    Hemoglobin 11.6 (*)    HCT 34.3 (*)    MCV 103.9 (*)    MCH 35.2 (*)    All other components within normal limits  RESP PANEL BY RT-PCR (RSV, FLU A&B, COVID)  [id number]    EKG EKG Interpretation Date/Time:  Tuesday January 03 2024 15:14:38  EDT Ventricular Rate:  103 PR Interval:  205 QRS Duration:  89 QT Interval:  346 QTC Calculation: 453 R Axis:   -14  Text Interpretation: Sinus tachycardia Low voltage, precordial leads LVH with secondary repolarization abnormality Anterior Q waves, possibly due to LVH Baseline wander in lead(s) I II III aVR aVL aVF V3 V4 V5 Confirmed by Mozell Arias [phone]) on 01/03/2024 5:12:25 PM  Radiology DG Chest Portable 1 View Result Date: 01/03/2024 CLINICAL DATA:  sob EXAM: PORTABLE CHEST - 1 VIEW COMPARISON:  03/22/2024 FINDINGS: Persistent prominence of the right hilar region with overall decrease in size of the masslike consolidation in the interim. No new airspace consolidation, pleural effusion, or pneumothorax. No cardiomegaly. Tortuous aorta with aortic atherosclerosis. No acute fracture or destructive lesion. Multilevel thoracic osteophytosis. IMPRESSION: Persistent prominence of the right hilar region with overall decreased size of the masslike consolidation in the interim. No new airspace consolidation, pulmonary edema, or pleural effusion. A repeat two-view chest radiograph in 6-12 weeks is recommended to document resolution. Electronically Signed   By: Rance Burrows M.D.   On: 01/03/2024 15:39    Procedures Procedures    Medications Ordered in ED Medications  albuterol  (PROVENTIL ) (2.5 MG/3ML) 0.083% nebulizer solution 5 mg (has no administration in time range)  albuterol  (PROVENTIL ) (2.5 MG/3ML) 0.083% nebulizer solution 5 mg (5 mg Nebulization Given 01/03/24 1618)    ED Course/ Medical Decision Making/ A&P                                 Medical Decision Making Amount and/or Complexity of Data Reviewed Labs: ordered. Radiology: ordered.  Risk Prescription drug management. Decision regarding hospitalization.   Patient with shortness of breath.  Differential diagnose includes sarcoid, pneumonia, COPD.  Recent pneumonia.  With wheezes now we will give another  breathing treatment and get x-ray and basic blood work.  Reviewed discharge note.   X-ray does show some improvement of previous infection.  However has recurrent breathing treatments and continued wheezing.  Continue dyspnea.  Will give more treatments.  At rest oxygenation has improved somewhat.  But with continued wheezing and dyspnea I feel she benefit from admission to the hospital.  Will discuss with hospitalist.   Will now add on viral testing.  Also potentially sarcoid related.  Has had steroids by EMS        Final Clinical Impression(s) / ED Diagnoses Final diagnoses:  Wheezing  Rx / DC Orders ED Discharge Orders     None         Mozell Arias, MD 01/03/24 1731

## 2024-01-03 NOTE — ED Triage Notes (Signed)
 Pt to  the ed form [facility] via ems  with CC of sob with wheezing. Pt has a RA o2 of 90% for ems. Pt gave duo neb and albuterol . Pt relays sob with cp, dizziness x 2 days.

## 2024-01-03 NOTE — H&P (Addendum)
 History and Physical   Michelle Rios JYN:[id number] DOB: [birth year] DOA: 01/03/2024  PCP: [facility]   Patient coming from: Home/PCP  Chief Complaint: Shortness of breath  HPI: Michelle Rios is a 68 y.o. female with medical history significant of hyperlipidemia, anemia, sarcoidosis, obesity, asthma presenting with worsening shortness of breath.  Patient recently admitted 5/28-5/30 for pneumonia and asthma exacerbation.  Discharged on Augmentin  and azithromycin  which she completed.  Remains on steroids.  2 days of increased shortness of breath and wheezing.  Saw PCP today the ED via EMS.  Received Solu-Medrol  and nebulizer treatment prior to arrival in ED.  Saturations reportedly around 90.  Patient denies fevers, chills, chest pain, abdominal pain, constipation, diarrhea, nausea, vomiting  ED Course: Vital signs in the ED notable for blood pressure in the 130s-150s systolic, heart rate in the 90s-100s, saturations in the low 90s.  Lab workup included CMP with glucose 229, CBC with hemoglobin stable at 11.6.  Respiratory panel for flu COVID and RSV pending.  Chest x-ray with persistent right hilar prominence and interval improvement in masslike consolidation.  No new changes.  Patient received albuterol  x 2 in ED.  Review of Systems: As per HPI otherwise all other systems reviewed and are negative.  Past Medical History:  Diagnosis Date   AKI (acute kidney injury) (HCC) 09/15/2015   Anemia    Arthritis    "knees" (02/20/2018)   Asthma    Bilateral swelling of feet    CAP (community acquired pneumonia) 04/11/2022   Community acquired pneumonia 12/21/2023   Constipation    Daily headache    Diarrhea 09/15/2015   Hiatal hernia    History of blood transfusion 1979   w/childbirth   Hypertension    Lactose intolerance    Osteoarthritis    Pulmonary embolism (HCC) 1990s X 1   Rheumatoid arthritis (HCC)    Sarcoidosis of lung (HCC)    SOB (shortness of breath)      Past Surgical History:  Procedure Laterality Date   ANKLE FRACTURE SURGERY Right    KNEE ARTHROSCOPY Right 1990s   TUBAL LIGATION  1990s   VAGINAL HYSTERECTOMY  1990s   WRIST FRACTURE SURGERY Left 2001    Social History  reports that she has never smoked. She has never used smokeless tobacco. She reports that she does not drink alcohol and does not use drugs.  Allergies  Allergen Reactions   Ivp Dye [Iodinated Contrast Media] Anaphylaxis and Swelling   Voltaren  [Diclofenac  Sodium] Nausea And Vomiting and Other (See Comments)    Bad taste in mouth Mouth and tongue turned black Vomiting thick, white mucous     Family History  Problem Relation Age of Onset   Diabetes Mother    Kidney disease Mother    Hyperlipidemia Father    Hypertension Father    Heart attack Father    Heart disease Father    Sudden death Father    Alcoholism Father    Breast cancer Sister    Kidney cancer Sister    Diabetes Brother    Stroke Paternal Grandmother   Reviewed on admission  Prior to Admission medications   Medication Sig Start Date End Date Taking? Authorizing Provider  albuterol  (VENTOLIN  HFA) 108 (90 Base) MCG/ACT inhaler Inhale 2 puffs into the lungs every 6 (six) hours as needed for wheezing or shortness of breath. 12/23/23   Jodeane Mulligan, DO  amoxicillin -clavulanate (AUGMENTIN ) 875-125 MG tablet Take 1 tablet by mouth 2 (two) times  daily. 12/23/23   Jodeane Mulligan, DO  atorvastatin  (LIPITOR) 40 MG tablet Take 40 mg by mouth daily. 11/16/23   [provider]  guaiFENesin  (ROBITUSSIN) 100 MG/5ML liquid Take 5 mLs by mouth every 4 (four) hours as needed for cough or to loosen phlegm. 12/23/23   Jodeane Mulligan, DO  nabumetone  (RELAFEN ) 500 MG tablet Take 1 tablet (500 mg total) by mouth daily. 11/01/23   Reuben Castilla, NP  NIFEdipine  (PROCARDIA -XL/NIFEDICAL-XL) 30 MG 24 hr tablet Take 30 mg by mouth daily.    [provider]  oxyCODONE  (OXY IR/ROXICODONE ) 5 MG  immediate release tablet Take 1 tablet (5 mg total) by mouth every 6 (six) hours as needed for severe pain (pain score 7-10). 12/23/23   Jodeane Mulligan, DO  predniSONE  (DELTASONE ) 20 MG tablet Take 2 tablets (40 mg total) by mouth daily after breakfast. 12/24/23   Jodeane Mulligan, DO    Physical Exam: Vitals:   01/03/24 1514 01/03/24 1515 01/03/24 1545 01/03/24 1700  BP:  (!) 156/85 135/80 132/75  Pulse:  (!) 102 95 98  Resp:  16 19 20   Temp:  98.4 F (36.9 C)    TempSrc:  Oral    SpO2:  100% 100% 100%  Weight: 117 kg     Height: 5\' 1"  (1.549 m)       Physical Exam Constitutional:      General: She is not in acute distress.    Appearance: Normal appearance. She is obese.  HENT:     Head: Normocephalic and atraumatic.     Mouth/Throat:     Mouth: Mucous membranes are moist.     Pharynx: Oropharynx is clear.  Eyes:     Extraocular Movements: Extraocular movements intact.     Pupils: Pupils are equal, round, and reactive to light.  Cardiovascular:     Rate and Rhythm: Normal rate and regular rhythm.     Pulses: Normal pulses.     Heart sounds: Normal heart sounds.  Pulmonary:     Effort: Pulmonary effort is normal. No respiratory distress.     Breath sounds: Wheezing present.  Abdominal:     General: Bowel sounds are normal. There is no distension.     Palpations: Abdomen is soft.     Tenderness: There is no abdominal tenderness.  Musculoskeletal:        General: No swelling or deformity.  Skin:    General: Skin is warm and dry.  Neurological:     General: No focal deficit present.     Mental Status: Mental status is at baseline.    Labs on Admission: I have personally reviewed following labs and imaging studies  CBC: Recent Labs  Lab 01/03/24 1601  WBC 7.3  NEUTROABS 6.2  HGB 11.6*  HCT 34.3*  MCV 103.9*  PLT 369    Basic Metabolic Panel: Recent Labs  Lab 01/03/24 1601  NA 137  K 4.4  CL 101  CO2 22  GLUCOSE 229*  BUN 20  CREATININE 1.06*   CALCIUM  8.9    GFR: Estimated Creatinine Clearance: 61.4 mL/min (A) (by C-G formula based on SCr of 1.06 mg/dL (H)).  Liver Function Tests: Recent Labs  Lab 01/03/24 1601  AST 37  ALT 27  ALKPHOS 98  BILITOT 0.3  PROT 7.4  ALBUMIN  3.5    Urine analysis:    Component Value Date/Time   COLORURINE AMBER (A) 08/30/2023 1954   APPEARANCEUR CLOUDY (A) 08/30/2023 1954  LABSPEC 1.026 08/30/2023 1954   PHURINE 5.0 08/30/2023 1954   GLUCOSEU NEGATIVE 08/30/2023 1954   HGBUR NEGATIVE 08/30/2023 1954   BILIRUBINUR NEGATIVE 08/30/2023 1954   KETONESUR NEGATIVE 08/30/2023 1954   PROTEINUR 100 (A) 08/30/2023 1954   UROBILINOGEN 1.0 08/30/2009 0615   NITRITE NEGATIVE 08/30/2023 1954   LEUKOCYTESUR TRACE (A) 08/30/2023 1954    Radiological Exams on Admission: DG Chest Portable 1 View Result Date: 01/03/2024 CLINICAL DATA:  sob EXAM: PORTABLE CHEST - 1 VIEW COMPARISON:  03/22/2024 FINDINGS: Persistent prominence of the right hilar region with overall decrease in size of the masslike consolidation in the interim. No new airspace consolidation, pleural effusion, or pneumothorax. No cardiomegaly. Tortuous aorta with aortic atherosclerosis. No acute fracture or destructive lesion. Multilevel thoracic osteophytosis. IMPRESSION: Persistent prominence of the right hilar region with overall decreased size of the masslike consolidation in the interim. No new airspace consolidation, pulmonary edema, or pleural effusion. A repeat two-view chest radiograph in 6-12 weeks is recommended to document resolution. Electronically Signed   By: Rance Burrows M.D.   On: 01/03/2024 15:39   EKG: Independently reviewed.  Sinus tachycardia 103 bpm.  Nonspecific T wave changes.  Significant baseline wander in all leads.  Assessment/Plan Active Problems:   Sarcoidosis of lung (HCC)   Essential hypertension   Obesity, Class III, BMI 40-49.9 (morbid obesity)   Anemia of chronic disease   HLD (hyperlipidemia)    Asthma, chronic, unspecified asthma severity, with acute exacerbation   Asthma exacerbation Pulmonary sarcoid > Presenting with worsening shortness of breath and wheezing for 2 days.  Continued wheezing and symptoms despite Solu-Medrol  and multiple nebulizer treatments.  No hypoxia. > Was recent admitted for pneumonia and exacerbation, remained on low-dose steroids for this but completed antibiotics.  Chest x-ray improving. - Monitor on telemetry overnight - Continue daily steroids - Scheduled Atrovent, as needed albuterol  - Follow-up respiratory viral panel supportive care  Hypertension - Continue home med nifedipine   Hyperlipidemia - Continue atorvastatin   Anemia > Hemoglobin stable at 11.6 in ED. - Trend CBC  Obesity - Noted  DVT prophylaxis: Lovenox  Code Status:   Full Family Communication:  None on admission  Disposition Plan:   Patient is from:  Home  Anticipated DC to:  Home  Anticipated DC date:  1 to 2 days  Anticipated DC barriers: None  Consults called:  None Admission status:  Observation, telemetry  Severity of Illness: The appropriate patient status for this patient is OBSERVATION. Observation status is judged to be reasonable and necessary in order to provide the required intensity of service to ensure the patient's safety. The patient's presenting symptoms, physical exam findings, and initial radiographic and laboratory data in the context of their medical condition is felt to place them at decreased risk for further clinical deterioration. Furthermore, it is anticipated that the patient will be medically stable for discharge from the hospital within 2 midnights of admission.    Johnetta Nab MD [facility]  How to contact the [facility] Attending or Consulting provider 7A - 7P or covering provider during after hours 7P -7A, for this patient?   Check the care team in [facility] and look for a) attending/consulting [facility] provider listed and b) the [facility] team  listed Log into [email or web address] and use [place]'s universal password to access. If you do not have the password, please contact the hospital operator. Locate the [facility] provider you are looking for under [facility] and page to a number that you can be directly  reached. If you still have difficulty reaching the provider, please page the [facility] (Director on Call) for the Hospitalists listed on amion for assistance.  01/03/2024, 6:08 PM

## 2024-01-04 DIAGNOSIS — Z6841 Body Mass Index (BMI) 40.0 and over, adult: Secondary | ICD-10-CM | POA: Diagnosis not present

## 2024-01-04 DIAGNOSIS — Z79899 Other long term (current) drug therapy: Secondary | ICD-10-CM | POA: Diagnosis not present

## 2024-01-04 DIAGNOSIS — Z811 Family history of alcohol abuse and dependence: Secondary | ICD-10-CM | POA: Diagnosis not present

## 2024-01-04 DIAGNOSIS — E041 Nontoxic single thyroid nodule: Secondary | ICD-10-CM | POA: Diagnosis present

## 2024-01-04 DIAGNOSIS — Z803 Family history of malignant neoplasm of breast: Secondary | ICD-10-CM | POA: Diagnosis not present

## 2024-01-04 DIAGNOSIS — D860 Sarcoidosis of lung: Secondary | ICD-10-CM | POA: Diagnosis present

## 2024-01-04 DIAGNOSIS — Z91041 Radiographic dye allergy status: Secondary | ICD-10-CM | POA: Diagnosis not present

## 2024-01-04 DIAGNOSIS — Z823 Family history of stroke: Secondary | ICD-10-CM | POA: Diagnosis not present

## 2024-01-04 DIAGNOSIS — R062 Wheezing: Secondary | ICD-10-CM | POA: Diagnosis present

## 2024-01-04 DIAGNOSIS — I10 Essential (primary) hypertension: Secondary | ICD-10-CM | POA: Diagnosis present

## 2024-01-04 DIAGNOSIS — Z86711 Personal history of pulmonary embolism: Secondary | ICD-10-CM | POA: Diagnosis not present

## 2024-01-04 DIAGNOSIS — J45901 Unspecified asthma with (acute) exacerbation: Secondary | ICD-10-CM | POA: Diagnosis present

## 2024-01-04 DIAGNOSIS — Z90710 Acquired absence of both cervix and uterus: Secondary | ICD-10-CM | POA: Diagnosis not present

## 2024-01-04 DIAGNOSIS — Z7952 Long term (current) use of systemic steroids: Secondary | ICD-10-CM | POA: Diagnosis not present

## 2024-01-04 DIAGNOSIS — Z8249 Family history of ischemic heart disease and other diseases of the circulatory system: Secondary | ICD-10-CM | POA: Diagnosis not present

## 2024-01-04 DIAGNOSIS — M069 Rheumatoid arthritis, unspecified: Secondary | ICD-10-CM | POA: Diagnosis present

## 2024-01-04 DIAGNOSIS — Z886 Allergy status to analgesic agent status: Secondary | ICD-10-CM | POA: Diagnosis not present

## 2024-01-04 DIAGNOSIS — Z1152 Encounter for screening for COVID-19: Secondary | ICD-10-CM | POA: Diagnosis not present

## 2024-01-04 DIAGNOSIS — D638 Anemia in other chronic diseases classified elsewhere: Secondary | ICD-10-CM | POA: Diagnosis present

## 2024-01-04 DIAGNOSIS — Z833 Family history of diabetes mellitus: Secondary | ICD-10-CM | POA: Diagnosis not present

## 2024-01-04 DIAGNOSIS — Z8051 Family history of malignant neoplasm of kidney: Secondary | ICD-10-CM | POA: Diagnosis not present

## 2024-01-04 DIAGNOSIS — Z841 Family history of disorders of kidney and ureter: Secondary | ICD-10-CM | POA: Diagnosis not present

## 2024-01-04 DIAGNOSIS — Z83438 Family history of other disorder of lipoprotein metabolism and other lipidemia: Secondary | ICD-10-CM | POA: Diagnosis not present

## 2024-01-04 DIAGNOSIS — E66813 Obesity, class 3: Secondary | ICD-10-CM | POA: Diagnosis present

## 2024-01-04 DIAGNOSIS — E785 Hyperlipidemia, unspecified: Secondary | ICD-10-CM | POA: Diagnosis present

## 2024-01-04 DIAGNOSIS — R008 Other abnormalities of heart beat: Secondary | ICD-10-CM | POA: Diagnosis not present

## 2024-01-04 LAB — CBC
HCT: 32.5 % — ABNORMAL LOW (ref 36.0–46.0)
Hemoglobin: 11 g/dL — ABNORMAL LOW (ref 12.0–15.0)
MCH: 34.5 pg — ABNORMAL HIGH (ref 26.0–34.0)
MCHC: 33.8 g/dL (ref 30.0–36.0)
MCV: 101.9 fL — ABNORMAL HIGH (ref 80.0–100.0)
Platelets: 360 10*3/uL (ref 150–400)
RBC: 3.19 MIL/uL — ABNORMAL LOW (ref 3.87–5.11)
RDW: 11.8 % (ref 11.5–15.5)
WBC: 6.8 10*3/uL (ref 4.0–10.5)
nRBC: 0 % (ref 0.0–0.2)

## 2024-01-04 LAB — BRAIN NATRIURETIC PEPTIDE: B Natriuretic Peptide: 476.1 pg/mL — ABNORMAL HIGH (ref 0.0–100.0)

## 2024-01-04 LAB — COMPREHENSIVE METABOLIC PANEL WITH GFR
ALT: 28 U/L (ref 0–44)
AST: 32 U/L (ref 15–41)
Albumin: 3.3 g/dL — ABNORMAL LOW (ref 3.5–5.0)
Alkaline Phosphatase: 94 U/L (ref 38–126)
Anion gap: 11 (ref 5–15)
BUN: 17 mg/dL (ref 8–23)
CO2: 24 mmol/L (ref 22–32)
Calcium: 9.1 mg/dL (ref 8.9–10.3)
Chloride: 101 mmol/L (ref 98–111)
Creatinine, Ser: 0.96 mg/dL (ref 0.44–1.00)
GFR, Estimated: >60 mL/min (ref >60)
Glucose, Bld: 167 mg/dL — ABNORMAL HIGH (ref 70–99)
Potassium: 4.6 mmol/L (ref 3.5–5.1)
Sodium: 136 mmol/L (ref 135–145)
Total Bilirubin: 0.7 mg/dL (ref 0.0–1.2)
Total Protein: 7.4 g/dL (ref 6.5–8.1)

## 2024-01-04 LAB — RESP PANEL BY RT-PCR (RSV, FLU A&B, COVID)  RVPGX2
Influenza A by PCR: NEGATIVE
Influenza B by PCR: NEGATIVE
Resp Syncytial Virus by PCR: NEGATIVE
SARS Coronavirus 2 by RT PCR: NEGATIVE

## 2024-01-04 MED ORDER — ALBUTEROL SULFATE (2.5 MG/3ML) 0.083% IN NEBU
2.5000 mg | INHALATION_SOLUTION | RESPIRATORY_TRACT | Status: DC | PRN
  Administered 2024-01-05: 2.5 mg via RESPIRATORY_TRACT
  Filled 2024-01-04: qty 3

## 2024-01-04 MED ORDER — LEVALBUTEROL HCL 0.63 MG/3ML IN NEBU: 0.6300 mg | INHALATION_SOLUTION | RESPIRATORY_TRACT | Status: DC | PRN

## 2024-01-04 MED ORDER — IPRATROPIUM-ALBUTEROL 0.5-2.5 (3) MG/3ML IN SOLN
3.0000 mL | RESPIRATORY_TRACT | Status: AC
  Administered 2024-01-04: 3 mL via RESPIRATORY_TRACT
  Filled 2024-01-04: qty 3

## 2024-01-04 MED ORDER — IPRATROPIUM-ALBUTEROL 0.5-2.5 (3) MG/3ML IN SOLN
3.0000 mL | Freq: Four times a day (QID) | RESPIRATORY_TRACT | Status: DC
  Administered 2024-01-04: 3 mL via RESPIRATORY_TRACT
  Filled 2024-01-04 (×2): qty 3

## 2024-01-04 MED ORDER — IPRATROPIUM-ALBUTEROL 0.5-2.5 (3) MG/3ML IN SOLN
3.0000 mL | RESPIRATORY_TRACT | Status: DC
  Administered 2024-01-04 – 2024-01-09 (×28): 3 mL via RESPIRATORY_TRACT
  Filled 2024-01-04 (×27): qty 3

## 2024-01-04 MED ORDER — LEVALBUTEROL HCL 0.63 MG/3ML IN NEBU: 0.6300 mg | INHALATION_SOLUTION | Freq: Four times a day (QID) | RESPIRATORY_TRACT | Status: DC

## 2024-01-04 NOTE — Plan of Care (Signed)

## 2024-01-04 NOTE — Progress Notes (Signed)
 PROGRESS NOTE    Michelle Rios  NWG:[id number] DOB: May 25, 1956 DOA: 01/03/2024 PCP: [facility] Complaint  Patient presents with   Shortness of Breath    Brief Narrative:   NIAMBI SMOAK is Michelle Rios 68 y.o. female with medical history significant of hyperlipidemia, anemia, sarcoidosis, obesity, asthma presenting with worsening shortness of breath.   Admitted for asthma exacerbation.   Assessment & Plan:   Principal Problem:   Asthma exacerbation Active Problems:   Sarcoidosis of lung (HCC)   Essential hypertension   Obesity, Class III, BMI 40-49.9 (morbid obesity)   Anemia of chronic disease   HLD (hyperlipidemia)   Asthma, chronic, unspecified asthma severity, with acute exacerbation  Asthma exacerbation Pulmonary sarcoid Shortness of Breath  > Presenting with worsening shortness of breath and wheezing for 2 days.  Continued wheezing and symptoms despite Solu-Medrol  and multiple nebulizer treatments.  No hypoxia. > Was recent admitted for pneumonia and exacerbation (CT 5/28 with RUL pneumonia vs mass), remained on low-dose steroids for this but completed antibiotics.   - negative covid, flu, RSV - CXR without new airspace consolidation, edema, or effusion - persistent prominence of R hilar region with decreased size of masslike consolidation.  Recommending repeat 2 view CXR in 6-12 weeks.  - Monitor on telemetry overnight - Continue daily steroids - scheduled and prn nebs - needs controller medicine on discharge - needs pulmonary follow up outpatient   Hypertension - Continue home med nifedipine    Hyperlipidemia - Continue atorvastatin    Anemia - mild, stable, trend   Obesity - Noted Body mass index is 45.03 kg/m.    DVT prophylaxis: lovenox  Code Status: full Family Communication: none Disposition:   Status is: Observation The patient will require care spanning > 2 midnights and should be moved to inpatient because: ongoing respiratory  distress   Consultants:  none  Procedures:  none  Antimicrobials:  Anti-infectives (From admission, onward)    None       Subjective: C/o SOB   Objective: Vitals:   01/04/24 0017 01/04/24 0226 01/04/24 0435 01/04/24 0721  BP: (!) 147/79  132/79 (!) 141/92  Pulse: 91  84 84  Resp: 19  18 19   Temp: 99.9 F (37.7 C)  98.6 F (37 C) 98 F (36.7 C)  TempSrc: Oral  Oral   SpO2: 97% 98% 98% 95%  Weight:      Height:        Intake/Output Summary (Last 24 hours) at 01/04/2024 5784 Last data filed at 01/03/2024 2300 Gross per 24 hour  Intake 340 ml  Output --  Net 340 ml   Filed Weights   01/03/24 1514 01/03/24 2300  Weight: 117 kg 108.1 kg    Examination:  General exam: Appears acutely short of breath, sitting up, leaning on tray  Respiratory system: increased wob, audible wheezing from the door - diffuse wheezing throughout with accessory muscle use, some transmitted upper airway sounds Cardiovascular system: mild tachycardia Gastrointestinal system: Abdomen is nondistended, soft and nontender.  Central nervous system: Alert and oriented. No focal neurological deficits. Extremities: bilateral LE lymphedema   Data Reviewed: I have personally reviewed following labs and imaging studies  CBC: Recent Labs  Lab 01/03/24 1601 01/04/24 0402  WBC 7.3 6.8  NEUTROABS 6.2  --   HGB 11.6* 11.0*  HCT 34.3* 32.5*  MCV 103.9* 101.9*  PLT 369 360    Basic Metabolic Panel: Recent Labs  Lab 01/03/24 1601 01/04/24 0402  NA 137 136  K 4.4 4.6  CL 101 101  CO2 22 24  GLUCOSE 229* 167*  BUN 20 17  CREATININE 1.06* 0.96  CALCIUM  8.9 9.1    GFR: Estimated Creatinine Clearance: 64.5 mL/min (by C-G formula based on SCr of 0.96 mg/dL).  Liver Function Tests: Recent Labs  Lab 01/03/24 1601 01/04/24 0402  AST 37 32  ALT 27 28  ALKPHOS 98 94  BILITOT 0.3 0.7  PROT 7.4 7.4  ALBUMIN  3.5 3.3*    CBG: No results for input(s): GLUCAP in the last 168  hours.   Recent Results (from the past 240 hours)  Resp panel by RT-PCR (RSV, Flu [place] Hedman&B, Covid) Anterior Nasal Swab     Status: None   Collection Time: 01/03/24 11:01 PM   Specimen: Anterior Nasal Swab  Result Value Ref Range Status   SARS Coronavirus 2 by RT PCR NEGATIVE NEGATIVE Final   Influenza [place] by PCR NEGATIVE NEGATIVE Final   Influenza B by PCR NEGATIVE NEGATIVE Final    Comment: (NOTE) The Xpert Xpress SARS-CoV-2/FLU/RSV plus assay is intended as an aid in the diagnosis of influenza from Nasopharyngeal swab specimens and should not be used as Dinna Severs sole basis for treatment. Nasal washings and aspirates are unacceptable for Xpert Xpress SARS-CoV-2/FLU/RSV testing.  Fact Sheet for Patients: BloggerCourse.com  Fact Sheet for Healthcare Providers: SeriousBroker.it  This test is not yet approved or cleared by the [place]  FDA and has been authorized for detection and/or diagnosis of SARS-CoV-2 by FDA under an Emergency Use Authorization (EUA). This EUA will remain in effect (meaning this test can be used) for the duration of the COVID-19 declaration under Section 564(b)(1) of the Act, 21 U.S.C. section 360bbb-3(b)(1), unless the authorization is terminated or revoked.     Resp Syncytial Virus by PCR NEGATIVE NEGATIVE Final    Comment: (NOTE) Fact Sheet for Patients: BloggerCourse.com  Fact Sheet for Healthcare Providers: SeriousBroker.it  This test is not yet approved or cleared by the [place]  FDA and has been authorized for detection and/or diagnosis of SARS-CoV-2 by FDA under an Emergency Use Authorization (EUA). This EUA will remain in effect (meaning this test can be used) for the duration of the COVID-19 declaration under Section 564(b)(1) of the Act, 21 U.S.C. section 360bbb-3(b)(1), unless the authorization is terminated or revoked.  Performed at [facility], [address] [place], [place] [address]          Radiology Studies: DG Chest Portable 1 View Result Date: 01/03/2024 CLINICAL DATA:  sob EXAM: PORTABLE CHEST - 1 VIEW COMPARISON:  03/22/2024 FINDINGS: Persistent prominence of the right hilar region with overall decrease in size of the masslike consolidation in the interim. No new airspace consolidation, pleural effusion, or pneumothorax. No cardiomegaly. Tortuous aorta with aortic atherosclerosis. No acute fracture or destructive lesion. Multilevel thoracic osteophytosis. IMPRESSION: Persistent prominence of the right hilar region with overall decreased size of the masslike consolidation in the interim. No new airspace consolidation, pulmonary edema, or pleural effusion. Junior Kenedy repeat two-view chest radiograph in 6-12 weeks is recommended to document resolution. Electronically Signed   By: Rance Burrows M.D.   On: 01/03/2024 15:39        Scheduled Meds:  atorvastatin   40 mg Oral Daily   enoxaparin  (LOVENOX ) injection  40 mg Subcutaneous Q24H   ipratropium  0.5 mg Nebulization Q6H WA   ipratropium-albuterol   3 mL Nebulization Q6H WA   NIFEdipine   30 mg Oral Daily   predniSONE   50 mg  Oral Q breakfast   sodium chloride  flush  3 mL Intravenous Q12H   Continuous Infusions:   LOS: 0 days    Time spent: over 30 min    Donnetta Gains, MD [facility]   To contact the attending provider between 7A-7P or the covering provider during after hours 7P-7A, please log into the web site [email or web address] and access using universal [place] password for that web site. If you do not have the password, please call the hospital operator.  01/04/2024, 9:18 AM

## 2024-01-04 NOTE — Progress Notes (Signed)
 RT in room for scheduled nebulizer. Pt with mild stridor. Pt showing no signs of inc WOB and denies SOB. Duo neb given and Pt states her breathing is fine at this time VSS w/ SPO2 of 96 on RA. MD aware.

## 2024-01-04 NOTE — TOC Initial Note (Signed)
 Transition of Care [facility]) - Initial/Assessment Note    Patient Details  Name: Michelle Rios MRN: [id number] Date of Birth: Dec 07, 1955  Transition of [facility]) CM/SW Contact:    Omie Bickers, RN Phone Number: 01/04/2024, 10:15 AM  Clinical Narrative:                  Patient in obs from home for asthma exacerbation.  Insurance and PCP listed, added to AVS to call to schedule hospital follow up.  Patient currently charted as room air.  No immediate TOC needs identified.    Barriers to Discharge: Continued Medical Work up   Patient Goals and CMS Choice            Expected Discharge Plan and Services       Living arrangements for the past 2 months: Single Family Home                                      Prior Living Arrangements/Services Living arrangements for the past 2 months: Single Family Home                     Activities of Daily Living   ADL Screening (condition at time of admission) Independently performs ADLs?: No Does the patient have a NEW difficulty with bathing/dressing/toileting/self-feeding that is expected to last >3 days?: No Does the patient have a NEW difficulty with getting in/out of bed, walking, or climbing stairs that is expected to last >3 days?: No Does the patient have a NEW difficulty with communication that is expected to last >3 days?: No Is the patient deaf or have difficulty hearing?: No Does the patient have difficulty seeing, even when wearing glasses/contacts?: No Does the patient have difficulty concentrating, remembering, or making decisions?: No  Permission Sought/Granted                  Emotional Assessment              Admission diagnosis:  Wheezing [R06.2] Asthma exacerbation [J45.901] Patient Active Problem List   Diagnosis Date Noted   Wheezing 01/04/2024   Asthma exacerbation 01/03/2024   Asthma, chronic, unspecified asthma severity, with acute exacerbation 12/21/2023   Right flank  pain 04/20/2022   Pleural effusion, right 04/20/2022   History of sarcoidosis 04/20/2022   Lymphadenopathy 04/20/2022   Elevated liver enzymes 04/20/2022   Thyroid nodule 04/13/2022   Acute respiratory failure with hypoxia (HCC) 04/11/2022   Vitamin D  deficiency 04/11/2022   Right-sided chest pain, pleuritic    Abdominal pain 02/20/2018   Diarrhea 02/20/2018   Unexplained night sweats 02/01/2018   Rash 02/01/2018   Anemia of chronic disease 12/06/2017   HLD (hyperlipidemia) 12/06/2017   Acute reaction to stress 11/12/2017   Pain in right knee 09/23/2017   Essential hypertension 02/09/2017   Obesity, Class III, BMI 40-49.9 (morbid obesity) 02/09/2017   Sarcoidosis of lung (HCC) 02/09/2017   Right upper quadrant abdominal tenderness    Dyspnea    PCP:  [facility], [employer] Pharmacy:   [id number] - [place] - [address] [place] [address] Phone: [phone] Fax: [phone]  [facility] [address]), [place] - [address] [place]) [place] [address] Phone: [phone] Fax: [phone]  [facility] [address] [place]  [place] [address] Phone: [phone] Fax: [phone]     Social Drivers of Health (SDOH) Social History: SDOH Screenings   Food Insecurity: No Food Insecurity (01/03/2024)  Housing: Low Risk  (01/03/2024)  Transportation Needs: No Transportation Needs (01/03/2024)  Utilities: Not At Risk (01/03/2024)  Depression (PHQ2-9): High Risk (08/04/2021)  Financial Resource Strain: Low Risk  (11/17/2023)   Received from [facility]  Social Connections: Moderately Integrated (01/03/2024)  Recent Concern: Social Connections - Moderately Isolated (12/21/2023)  Tobacco Use: Low Risk  (01/03/2024)   SDOH Interventions:     Readmission Risk Interventions     No data to display

## 2024-01-04 NOTE — Plan of Care (Signed)
  Problem: Education: Goal: Knowledge of General Education information will improve Description: Including pain rating scale, medication(s)/side effects and non-pharmacologic comfort measures Outcome: Progressing   Problem: Clinical Measurements: Goal: Ability to maintain clinical measurements within normal limits will improve Outcome: Progressing   Problem: Activity: Goal: Risk for activity intolerance will decrease Outcome: Progressing   Problem: Nutrition: Goal: Adequate nutrition will be maintained Outcome: Progressing   Problem: Elimination: Goal: Will not experience complications related to bowel motility Outcome: Progressing   Problem: Safety: Goal: Ability to remain free from injury will improve Outcome: Progressing   Problem: Skin Integrity: Goal: Risk for impaired skin integrity will decrease Outcome: Progressing   

## 2024-01-04 NOTE — Care Management Obs Status (Signed)
 MEDICARE OBSERVATION STATUS NOTIFICATION   Patient Details  Name: Michelle Rios MRN: [id number] Date of Birth: [birth year]   Medicare Observation Status Notification Given:  Yes  Letter signed copy given  [name] 01/04/2024, 8:58 AM

## 2024-01-05 ENCOUNTER — Inpatient Hospital Stay (HOSPITAL_COMMUNITY)

## 2024-01-05 DIAGNOSIS — R062 Wheezing: Secondary | ICD-10-CM | POA: Diagnosis not present

## 2024-01-05 LAB — COMPREHENSIVE METABOLIC PANEL WITH GFR
ALT: 26 U/L (ref 0–44)
AST: 25 U/L (ref 15–41)
Albumin: 3.4 g/dL — ABNORMAL LOW (ref 3.5–5.0)
Alkaline Phosphatase: 80 U/L (ref 38–126)
Anion gap: 9 (ref 5–15)
BUN: 17 mg/dL (ref 8–23)
CO2: 25 mmol/L (ref 22–32)
Calcium: 8.9 mg/dL (ref 8.9–10.3)
Chloride: 102 mmol/L (ref 98–111)
Creatinine, Ser: 0.95 mg/dL (ref 0.44–1.00)
GFR, Estimated: >60 mL/min (ref >60)
Glucose, Bld: 128 mg/dL — ABNORMAL HIGH (ref 70–99)
Potassium: 4.2 mmol/L (ref 3.5–5.1)
Sodium: 136 mmol/L (ref 135–145)
Total Bilirubin: 0.6 mg/dL (ref 0.0–1.2)
Total Protein: 7 g/dL (ref 6.5–8.1)

## 2024-01-05 LAB — CBC WITH DIFFERENTIAL/PLATELET
Abs Immature Granulocytes: 0.06 10*3/uL (ref 0.00–0.07)
Basophils Absolute: 0 10*3/uL (ref 0.0–0.1)
Basophils Relative: 0 %
Eosinophils Absolute: 0 10*3/uL (ref 0.0–0.5)
Eosinophils Relative: 0 %
HCT: 29.3 % — ABNORMAL LOW (ref 36.0–46.0)
Hemoglobin: 10.4 g/dL — ABNORMAL LOW (ref 12.0–15.0)
Immature Granulocytes: 1 %
Lymphocytes Relative: 19 %
Lymphs Abs: 2.4 10*3/uL (ref 0.7–4.0)
MCH: 36.6 pg — ABNORMAL HIGH (ref 26.0–34.0)
MCHC: 35.5 g/dL (ref 30.0–36.0)
MCV: 103.2 fL — ABNORMAL HIGH (ref 80.0–100.0)
Monocytes Absolute: 0.6 10*3/uL (ref 0.1–1.0)
Monocytes Relative: 5 %
Neutro Abs: 9.3 10*3/uL — ABNORMAL HIGH (ref 1.7–7.7)
Neutrophils Relative %: 75 %
Platelets: 380 10*3/uL (ref 150–400)
RBC: 2.84 MIL/uL — ABNORMAL LOW (ref 3.87–5.11)
RDW: 12.2 % (ref 11.5–15.5)
WBC: 12.3 10*3/uL — ABNORMAL HIGH (ref 4.0–10.5)
nRBC: 0 % (ref 0.0–0.2)

## 2024-01-05 LAB — BLOOD GAS, VENOUS
Acid-Base Excess: 4.8 mmol/L — ABNORMAL HIGH (ref 0.0–2.0)
Bicarbonate: 30.4 mmol/L — ABNORMAL HIGH (ref 20.0–28.0)
Drawn by: 616461
O2 Saturation: 92.3 %
Patient temperature: 37
pCO2, Ven: 48 mmHg (ref 44–60)
pH, Ven: 7.41 (ref 7.25–7.43)
pO2, Ven: 58 mmHg — ABNORMAL HIGH (ref 32–45)

## 2024-01-05 LAB — MAGNESIUM: Magnesium: 2.1 mg/dL (ref 1.7–2.4)

## 2024-01-05 LAB — PHOSPHORUS: Phosphorus: 2.9 mg/dL (ref 2.5–4.6)

## 2024-01-05 MED ORDER — ALBUTEROL SULFATE (2.5 MG/3ML) 0.083% IN NEBU
10.0000 mg | INHALATION_SOLUTION | RESPIRATORY_TRACT | Status: AC
  Administered 2024-01-05: 10 mg via RESPIRATORY_TRACT
  Filled 2024-01-05: qty 12

## 2024-01-05 MED ORDER — FUROSEMIDE 10 MG/ML IJ SOLN
40.0000 mg | Freq: Once | INTRAMUSCULAR | Status: AC
  Administered 2024-01-05: 40 mg via INTRAVENOUS
  Filled 2024-01-05: qty 4

## 2024-01-05 MED ORDER — SODIUM CHLORIDE 0.9 % IV SOLN
100.0000 mg | Freq: Two times a day (BID) | INTRAVENOUS | Status: DC
  Administered 2024-01-05 – 2024-01-07 (×5): 100 mg via INTRAVENOUS
  Filled 2024-01-05 (×7): qty 100

## 2024-01-05 MED ORDER — SODIUM CHLORIDE 0.9 % IV SOLN
2.0000 g | INTRAVENOUS | Status: DC
  Administered 2024-01-05 – 2024-01-08 (×4): 2 g via INTRAVENOUS
  Filled 2024-01-05 (×4): qty 20

## 2024-01-05 NOTE — Progress Notes (Signed)
 PROGRESS NOTE    [name]  ZOX:[id number] DOB: 27-Jan-1956 DOA: 01/03/2024 PCP: [facility], [facility] Complaint  Patient presents with   Shortness of Breath    Brief Narrative:   Michelle Rios is Michelle Rios 68 y.o. female with medical history significant of hyperlipidemia, anemia, sarcoidosis, obesity, asthma presenting with worsening shortness of breath.   Admitted for asthma exacerbation.   Assessment & Plan:   Principal Problem:   Asthma exacerbation Active Problems:   Sarcoidosis of lung (HCC)   Essential hypertension   Obesity, Class III, BMI 40-49.9 (morbid obesity)   Anemia of chronic disease   HLD (hyperlipidemia)   Asthma, chronic, unspecified asthma severity, with acute exacerbation   Wheezing  Asthma exacerbation Pulmonary sarcoid Shortness of Breath  > Presenting with worsening shortness of breath and wheezing for 2 days.  Continued wheezing and symptoms despite Solu-Medrol  and multiple nebulizer treatments.  No hypoxia. > Was recent admitted for pneumonia and exacerbation (CT 5/28 with RUL pneumonia vs mass), remained on low-dose steroids for this but completed antibiotics.   - negative covid, flu, RSV - CXR without new airspace consolidation, edema, or effusion - persistent prominence of R hilar region with decreased size of masslike consolidation.  Recommending repeat 2 view CXR in 6-12 weeks.  - CXR 6/12 with opacity in R peripheral mid lung (infiltrate or related to overlying artifact) - follow CT chest/neck (with persistent wheezing/stridor) - follow on abx, ceftriaxone /doxycycline - Monitor on telemetry overnight - Continue daily steroids - scheduled and prn nebs - needs controller medicine on discharge - needs pulmonary follow up outpatient   Hypertension - Continue home med nifedipine    Hyperlipidemia - Continue atorvastatin    Anemia - mild, stable, trend   Obesity - Noted Body mass index is 45.03 kg/m.    DVT prophylaxis:  lovenox  Code Status: full Family Communication: none Disposition:   Status is: Observation The patient will require care spanning > 2 midnights and should be moved to inpatient because: ongoing respiratory distress   Consultants:  none  Procedures:  none  Antimicrobials:  Anti-infectives (From admission, onward)    Start     Dose/Rate Route Frequency Ordered Stop   01/05/24 0730  cefTRIAXone  (ROCEPHIN ) 2 g in sodium chloride  0.9 % 100 mL IVPB        2 g 200 mL/hr over 30 Minutes Intravenous Every 24 hours 01/05/24 0638     01/05/24 0730  doxycycline (VIBRAMYCIN) 100 mg in sodium chloride  0.9 % 250 mL IVPB        100 mg 125 mL/hr over 120 Minutes Intravenous Every 12 hours 01/05/24 0638         Subjective: Feels the same SOB  Objective: Vitals:   01/05/24 0828 01/05/24 0900 01/05/24 1145 01/05/24 1208  BP: 121/79   106/78  Pulse: 87  91 96  Resp: (!) 22  (!) 24 17  Temp: 98 F (36.7 C) 98.2 F (36.8 C)  98.6 F (37 C)  TempSrc:      SpO2: 97%  96% 97%  Weight:      Height:       No intake or output data in the 24 hours ending 01/05/24 1442  Filed Weights   01/03/24 1514 01/03/24 2300  Weight: 117 kg 108.1 kg    Examination:  General: No acute distress. Cardiovascular: mild tachy Lungs:continued diffuse wheezing, transmitted upper airway sounsd Neurological: Alert and oriented 3. Moves all extremities 4 with equal strength. Cranial nerves II through  XII grossly intact. Skin: Warm and dry. No rashes or lesions. Extremities: lymphedema  Data Reviewed: I have personally reviewed following labs and imaging studies  CBC: Recent Labs  Lab 01/03/24 1601 01/04/24 0402 01/05/24 0428  WBC 7.3 6.8 12.3*  NEUTROABS 6.2  --  9.3*  HGB 11.6* 11.0* 10.4*  HCT 34.3* 32.5* 29.3*  MCV 103.9* 101.9* 103.2*  PLT 369 360 380    Basic Metabolic Panel: Recent Labs  Lab 01/03/24 1601 01/04/24 0402 01/05/24 0428  NA 137 136 136  K 4.4 4.6 4.2  CL 101 101  102  CO2 22 24 25   GLUCOSE 229* 167* 128*  BUN 20 17 17   CREATININE 1.06* 0.96 0.95  CALCIUM  8.9 9.1 8.9  MG  --   --  2.1  PHOS  --   --  2.9    GFR: Estimated Creatinine Clearance: 65.2 mL/min (by C-G formula based on SCr of 0.95 mg/dL).  Liver Function Tests: Recent Labs  Lab 01/03/24 1601 01/04/24 0402 01/05/24 0428  AST 37 32 25  ALT 27 28 26   ALKPHOS 98 94 80  BILITOT 0.3 0.7 0.6  PROT 7.4 7.4 7.0  ALBUMIN  3.5 3.3* 3.4*    CBG: No results for input(s): GLUCAP in the last 168 hours.   Recent Results (from the past 240 hours)  Resp panel by RT-PCR (RSV, Flu Natesha Hassey&B, Covid) Anterior Nasal Swab     Status: None   Collection Time: 01/03/24 11:01 PM   Specimen: Anterior Nasal Swab  Result Value Ref Range Status   SARS Coronavirus 2 by RT PCR NEGATIVE NEGATIVE Final   Influenza Louis Gaw by PCR NEGATIVE NEGATIVE Final   Influenza B by PCR NEGATIVE NEGATIVE Final    Comment: (NOTE) The Xpert Xpress SARS-CoV-2/FLU/RSV plus assay is intended as an aid in the diagnosis of influenza from Nasopharyngeal swab specimens and should not be used as Valerie Cones sole basis for treatment. Nasal washings and aspirates are unacceptable for Xpert Xpress SARS-CoV-2/FLU/RSV testing.  Fact Sheet for Patients: BloggerCourse.com  Fact Sheet for Healthcare Providers: SeriousBroker.it  This test is not yet approved or cleared by the [place]  FDA and has been authorized for detection and/or diagnosis of SARS-CoV-2 by FDA under an Emergency Use Authorization (EUA). This EUA will remain in effect (meaning this test can be used) for the duration of the COVID-19 declaration under Section 564(b)(1) of the Act, 21 U.S.C. section 360bbb-3(b)(1), unless the authorization is terminated or revoked.     Resp Syncytial Virus by PCR NEGATIVE NEGATIVE Final    Comment: (NOTE) Fact Sheet for Patients: BloggerCourse.com  Fact Sheet for  Healthcare Providers: SeriousBroker.it  This test is not yet approved or cleared by the [place]  FDA and has been authorized for detection and/or diagnosis of SARS-CoV-2 by FDA under an Emergency Use Authorization (EUA). This EUA will remain in effect (meaning this test can be used) for the duration of the COVID-19 declaration under Section 564(b)(1) of the Act, 21 U.S.C. section 360bbb-3(b)(1), unless the authorization is terminated or revoked.  Performed at [facility], [address] [place], [place] [address]          Radiology Studies: DG Chest Port 1 View Result Date: 01/05/2024 CLINICAL DATA:  [id number] with shortness of breath. EXAM: PORTABLE CHEST 1 VIEW COMPARISON:  Portable chest 01/03/2024 FINDINGS: 6:19 Sible Straley.m. There is Demitris Pokorny cone shaped plastic structure superimposing over the outer right upper to mid lung field. There is increased opacity in the underlying right  mid lung which could be developing infiltrate or due to overlying artifact related to the plastic device. The lungs are hypoexpanded and otherwise generally clear. There is mild cardiomegaly but no evidence of CHF. The mediastinum is stable with aortic tortuosity and patchy calcification. There is thoracic spondylosis and bridging enthesopathy with no new osseous findings. IMPRESSION: 1. Increased opacity in the right peripheral mid lung which could be developing infiltrate or due to overlying artifact related to Larone Kliethermes cone shaped plastic device. 2. Hypoexpanded and otherwise clear lungs. 3. Mild cardiomegaly. 4. Aortic atherosclerosis. Electronically Signed   By: Denman Fischer M.D.   On: 01/05/2024 06:32   DG Chest Portable 1 View Result Date: 01/03/2024 CLINICAL DATA:  sob EXAM: PORTABLE CHEST - 1 VIEW COMPARISON:  03/22/2024 FINDINGS: Persistent prominence of the right hilar region with overall decrease in size of the masslike consolidation in the interim. No new airspace consolidation,  pleural effusion, or pneumothorax. No cardiomegaly. Tortuous aorta with aortic atherosclerosis. No acute fracture or destructive lesion. Multilevel thoracic osteophytosis. IMPRESSION: Persistent prominence of the right hilar region with overall decreased size of the masslike consolidation in the interim. No new airspace consolidation, pulmonary edema, or pleural effusion. Jariana Shumard repeat two-view chest radiograph in 6-12 weeks is recommended to document resolution. Electronically Signed   By: Rance Burrows M.D.   On: 01/03/2024 15:39        Scheduled Meds:  atorvastatin   40 mg Oral Daily   enoxaparin  (LOVENOX ) injection  40 mg Subcutaneous Q24H   ipratropium-albuterol   3 mL Nebulization Q4H   NIFEdipine   30 mg Oral Daily   predniSONE   50 mg Oral Q breakfast   sodium chloride  flush  3 mL Intravenous Q12H   Continuous Infusions:  cefTRIAXone  (ROCEPHIN )  IV 2 g (01/05/24 0858)   doxycycline (VIBRAMYCIN) IV 100 mg (01/05/24 1011)     LOS: 1 day    Time spent: over 30 min    Donnetta Gains, MD [facility]   To contact the attending provider between 7A-7P or the covering provider during after hours 7P-7A, please log into the web site [email or web address] and access using universal Malcolm password for that web site. If you do not have the password, please call the hospital operator.  01/05/2024, 2:42 PM

## 2024-01-05 NOTE — Plan of Care (Signed)

## 2024-01-06 DIAGNOSIS — R062 Wheezing: Secondary | ICD-10-CM | POA: Diagnosis not present

## 2024-01-06 LAB — COMPREHENSIVE METABOLIC PANEL WITH GFR
ALT: 23 U/L (ref 0–44)
AST: 19 U/L (ref 15–41)
Albumin: 3.5 g/dL (ref 3.5–5.0)
Alkaline Phosphatase: 78 U/L (ref 38–126)
Anion gap: 12 (ref 5–15)
BUN: 20 mg/dL (ref 8–23)
CO2: 25 mmol/L (ref 22–32)
Calcium: 9.4 mg/dL (ref 8.9–10.3)
Chloride: 104 mmol/L (ref 98–111)
Creatinine, Ser: 0.95 mg/dL (ref 0.44–1.00)
GFR, Estimated: >60 mL/min (ref >60)
Glucose, Bld: 101 mg/dL — ABNORMAL HIGH (ref 70–99)
Potassium: 3.8 mmol/L (ref 3.5–5.1)
Sodium: 141 mmol/L (ref 135–145)
Total Bilirubin: 0.5 mg/dL (ref 0.0–1.2)
Total Protein: 7.2 g/dL (ref 6.5–8.1)

## 2024-01-06 LAB — CBC WITH DIFFERENTIAL/PLATELET
Abs Immature Granulocytes: 0.03 10*3/uL (ref 0.00–0.07)
Basophils Absolute: 0 10*3/uL (ref 0.0–0.1)
Basophils Relative: 1 %
Eosinophils Absolute: 0 10*3/uL (ref 0.0–0.5)
Eosinophils Relative: 0 %
HCT: 29 % — ABNORMAL LOW (ref 36.0–46.0)
Hemoglobin: 10.6 g/dL — ABNORMAL LOW (ref 12.0–15.0)
Immature Granulocytes: 0 %
Lymphocytes Relative: 32 %
Lymphs Abs: 2.4 10*3/uL (ref 0.7–4.0)
MCH: 37.7 pg — ABNORMAL HIGH (ref 26.0–34.0)
MCHC: 36.6 g/dL — ABNORMAL HIGH (ref 30.0–36.0)
MCV: 103.2 fL — ABNORMAL HIGH (ref 80.0–100.0)
Monocytes Absolute: 0.6 10*3/uL (ref 0.1–1.0)
Monocytes Relative: 7 %
Neutro Abs: 4.6 10*3/uL (ref 1.7–7.7)
Neutrophils Relative %: 60 %
Platelets: 380 10*3/uL (ref 150–400)
RBC: 2.81 MIL/uL — ABNORMAL LOW (ref 3.87–5.11)
RDW: 12.3 % (ref 11.5–15.5)
WBC: 7.6 10*3/uL (ref 4.0–10.5)
nRBC: 0 % (ref 0.0–0.2)

## 2024-01-06 LAB — BRAIN NATRIURETIC PEPTIDE: B Natriuretic Peptide: 24.8 pg/mL (ref 0.0–100.0)

## 2024-01-06 LAB — PHOSPHORUS: Phosphorus: 2.9 mg/dL (ref 2.5–4.6)

## 2024-01-06 LAB — MAGNESIUM: Magnesium: 2 mg/dL (ref 1.7–2.4)

## 2024-01-06 MED ORDER — FUROSEMIDE 10 MG/ML IJ SOLN
40.0000 mg | Freq: Once | INTRAMUSCULAR | Status: AC
  Administered 2024-01-06: 40 mg via INTRAVENOUS
  Filled 2024-01-06: qty 4

## 2024-01-06 NOTE — Care Management Important Message (Signed)
 Important Message  Patient Details  Name: Michelle Rios MRN: [id number] Date of Birth: 07-06-1956   Important Message Given:  Yes - Medicare IM     [name] 01/06/2024, 3:11 PM

## 2024-01-06 NOTE — Plan of Care (Signed)

## 2024-01-06 NOTE — Progress Notes (Addendum)
 PROGRESS NOTE    Michelle Rios  VWU:[id number] DOB: [birth year] DOA: 01/03/2024 PCP: [facility], [facility] Complaint  Patient presents with   Shortness of Breath    Brief Narrative:   Michelle Rios is Michelle Rios 68 y.o. female with medical history significant of hyperlipidemia, anemia, sarcoidosis, obesity, asthma presenting with worsening shortness of breath.   Admitted for asthma exacerbation.   Assessment & Plan:   Principal Problem:   Asthma exacerbation Active Problems:   Sarcoidosis of lung (HCC)   Essential hypertension   Obesity, Class III, BMI 40-49.9 (morbid obesity)   Anemia of chronic disease   HLD (hyperlipidemia)   Asthma, chronic, unspecified asthma severity, with acute exacerbation   Wheezing  Asthma exacerbation Pulmonary sarcoid Shortness of Breath  > Presenting with worsening shortness of breath and wheezing for 2 days.  Continued wheezing and symptoms despite Solu-Medrol  and multiple nebulizer treatments.  No hypoxia. > Was recent admitted for pneumonia and exacerbation (CT 5/28 with RUL pneumonia vs mass), remained on low-dose steroids for this but completed antibiotics.   - negative covid, flu, RSV - CXR without new airspace consolidation, edema, or effusion - persistent prominence of R hilar region with decreased size of masslike consolidation.  Recommending repeat 2 view CXR in 6-12 weeks.  - CXR 6/12 with opacity in R peripheral mid lung (infiltrate or related to overlying artifact) - CT chest with chronic volume loss in the R upper and middle lobes with mild ground glass density, distortion, and thickening of peribronchovascular interstitium (likely chronic post infectious or post inflammatory scarring) - negative CT neck - follow on abx, ceftriaxone /doxycycline  - will give lasix  as tolerated  - given continued wheezing, will discuss with pulmonology - Monitor on telemetry overnight - Continue daily steroids - scheduled and prn nebs - needs  controller medicine on discharge - needs pulmonary follow up outpatient   Hypertension - Continue home med nifedipine    Hyperlipidemia - Continue atorvastatin    Anemia - mild, stable, trend   Obesity - Noted Body mass index is 45.03 kg/m.  Thyroid Nodule - needs US     DVT prophylaxis: lovenox  Code Status: full Family Communication: none Disposition:   Status is: Observation The patient will require care spanning > 2 midnights and should be moved to inpatient because: ongoing respiratory distress   Consultants:  none  Procedures:  none  Antimicrobials:  Anti-infectives (From admission, onward)    Start     Dose/Rate Route Frequency Ordered Stop   01/05/24 0730  cefTRIAXone  (ROCEPHIN ) 2 g in sodium chloride  0.9 % 100 mL IVPB        2 g 200 mL/hr over 30 Minutes Intravenous Every 24 hours 01/05/24 0638     01/05/24 0730  doxycycline  (VIBRAMYCIN ) 100 mg in sodium chloride  0.9 % 250 mL IVPB        100 mg 125 mL/hr over 120 Minutes Intravenous Every 12 hours 01/05/24 0638         Subjective: Feels better than yesterday  Objective: Vitals:   01/06/24 0746 01/06/24 0753 01/06/24 1141 01/06/24 1214  BP:  114/74 116/63   Pulse:  99 90   Resp:  20 18   Temp:  97.7 F (36.5 C) 98 F (36.7 C)   TempSrc:      SpO2: 99% 96% 96% 96%  Weight:      Height:        Intake/Output Summary (Last 24 hours) at 01/06/2024 1427 Last data filed at 01/05/2024 1726 Gross  per 24 hour  Intake 350.94 ml  Output --  Net 350.94 ml    Filed Weights   01/03/24 1514 01/03/24 2300  Weight: 117 kg 108.1 kg    Examination:  General: No acute distress. Cardiovascular: RRR Lungs: continued wheezing, transmitted upper airway sounds - looks more comfortable, less work of breathing  Neurological: Alert and oriented 3. Moves all extremities 4 with equal strength. Cranial nerves II through XII grossly intact. Extremities: No clubbing or cyanosis. No edema.  Data Reviewed: I  have personally reviewed following labs and imaging studies  CBC: Recent Labs  Lab 01/03/24 1601 01/04/24 0402 01/05/24 0428 01/06/24 0831  WBC 7.3 6.8 12.3* 7.6  NEUTROABS 6.2  --  9.3* 4.6  HGB 11.6* 11.0* 10.4* 10.6*  HCT 34.3* 32.5* 29.3* 29.0*  MCV 103.9* 101.9* 103.2* 103.2*  PLT 369 360 380 380    Basic Metabolic Panel: Recent Labs  Lab 01/03/24 1601 01/04/24 0402 01/05/24 0428 01/06/24 0831  NA 137 136 136 141  K 4.4 4.6 4.2 3.8  CL 101 101 102 104  CO2 22 24 25 25   GLUCOSE 229* 167* 128* 101*  BUN 20 17 17 20   CREATININE 1.06* 0.96 0.95 0.95  CALCIUM  8.9 9.1 8.9 9.4  MG  --   --  2.1 2.0  PHOS  --   --  2.9 2.9    GFR: Estimated Creatinine Clearance: 65.2 mL/min (by C-G formula based on SCr of 0.95 mg/dL).  Liver Function Tests: Recent Labs  Lab 01/03/24 1601 01/04/24 0402 01/05/24 0428 01/06/24 0831  AST 37 32 25 19  ALT 27 28 26 23   ALKPHOS 98 94 80 78  BILITOT 0.3 0.7 0.6 0.5  PROT 7.4 7.4 7.0 7.2  ALBUMIN  3.5 3.3* 3.4* 3.5    CBG: No results for input(s): GLUCAP in the last 168 hours.   Recent Results (from the past 240 hours)  Resp panel by RT-PCR (RSV, Flu Michelle Rios&B, Covid) Anterior Nasal Swab     Status: None   Collection Time: 01/03/24 11:01 PM   Specimen: Anterior Nasal Swab  Result Value Ref Range Status   SARS Coronavirus 2 by RT PCR NEGATIVE NEGATIVE Final   Influenza Michelle Rios by PCR NEGATIVE NEGATIVE Final   Influenza B by PCR NEGATIVE NEGATIVE Final    Comment: (NOTE) The Xpert Xpress SARS-CoV-2/FLU/RSV plus assay is intended as an aid in the diagnosis of influenza from Nasopharyngeal swab specimens and should not be used as Michelle Rios sole basis for treatment. Nasal washings and aspirates are unacceptable for Xpert Xpress SARS-CoV-2/FLU/RSV testing.  Fact Sheet for Patients: BloggerCourse.com  Fact Sheet for Healthcare Providers: SeriousBroker.it  This test is not yet approved or  cleared by the [place]  FDA and has been authorized for detection and/or diagnosis of SARS-CoV-2 by FDA under an Emergency Use Authorization (EUA). This EUA will remain in effect (meaning this test can be used) for the duration of the COVID-19 declaration under Section 564(b)(1) of the Act, 21 U.S.C. section 360bbb-3(b)(1), unless the authorization is terminated or revoked.     Resp Syncytial Virus by PCR NEGATIVE NEGATIVE Final    Comment: (NOTE) Fact Sheet for Patients: BloggerCourse.com  Fact Sheet for Healthcare Providers: SeriousBroker.it  This test is not yet approved or cleared by the [place]  FDA and has been authorized for detection and/or diagnosis of SARS-CoV-2 by FDA under an Emergency Use Authorization (EUA). This EUA will remain in effect (meaning this test can be used) for the duration of  the COVID-19 declaration under Section 564(b)(1) of the Act, 21 U.S.C. section 360bbb-3(b)(1), unless the authorization is terminated or revoked.  Performed at [facility], [address], [place], [place] [address]          Radiology Studies: CT SOFT TISSUE NECK WO CONTRAST Result Date: 01/05/2024 CLINICAL DATA:  Initial evaluation for worsening shortness of breath, stridor. History of asthma, sarcoidosis. EXAM: CT NECK WITHOUT CONTRAST TECHNIQUE: Multidetector CT imaging of the neck was performed following the standard protocol without intravenous contrast. RADIATION DOSE REDUCTION: This exam was performed according to the departmental dose-optimization program which includes automated exposure control, adjustment of the mA and/or kV according to patient size and/or use of iterative reconstruction technique. COMPARISON:  Comparison made with concomitant CT of the chest formed at the same time. FINDINGS: Pharynx and larynx: Examination somewhat limited by lack of IV contrast and positioning. Oral cavity within  normal limits. Oropharynx and nasopharynx within normal limits without discrete mass or inflammatory changes. No visible retropharyngeal collection. Epiglottis within normal limits. Vallecula clear. Hypopharynx and supraglottic larynx grossly within normal limits. Glottis closed but grossly symmetric and within normal limits. Subglottic airway patent clear. Visualized esophagus within normal limits. Salivary glands: Salivary glands including the parotid and submandibular glands are within normal limits. Thyroid: Suspected left thyroid nodule measuring approximately 1.5 cm at the lower pole of the left thyroid lobe (series 4, image 53). Lymph nodes: No visible enlarged or pathologic lymph nodes within the neck. Vascular: Atheromatous change about the aortic arch and left carotid bulb. Evaluation of vascular structures otherwise limited by lack of IV contrast. Limited intracranial: Unremarkable. Visualized orbits: Unremarkable. Mastoids and visualized paranasal sinuses: Scattered mucoperiosteal thickening mucosal thickening present throughout the visualized paranasal sinuses. Visualized mastoids in middle ear cavities are clear. Skeleton: No discrete or worrisome osseous lesions. Mild to moderate degenerative spondylosis at C5-6 and C6-7. Degenerative spurring within the visualized upper thoracic spine. Upper chest: Scattered ground-glass density with bronchiectasis and scarring noted within the visualized right upper lobe, better evaluated on concomitant chest CT. Other: None. IMPRESSION: 1. Negative CT of the neck. No acute inflammatory changes or other abnormality identified. 2. Scattered ground-glass density with bronchiectasis and scarring within the visualized right upper lobe, better evaluated on concomitant chest CT. 3.  Aortic Atherosclerosis (ICD10-I70.0). 4. Suspected 1.5 cm left thyroid nodule. Further evaluation with dedicated thyroid ultrasound recommended (ref: J Am Coll Radiol. 2015 Feb;12(2): 143-50).  Electronically Signed   By: Virgia Griffins M.D.   On: 01/05/2024 22:15   CT CHEST WO CONTRAST Result Date: 01/05/2024 CLINICAL DATA:  Shortness of breath EXAM: CT CHEST WITHOUT CONTRAST TECHNIQUE: Multidetector CT imaging of the chest was performed following the standard protocol without IV contrast. RADIATION DOSE REDUCTION: This exam was performed according to the departmental dose-optimization program which includes automated exposure control, adjustment of the mA and/or kV according to patient size and/or use of iterative reconstruction technique. COMPARISON:  01/05/2024 chest x-ray, CT chest 12/13/2023, 04/19/2022 FINDINGS: Cardiovascular: Limited evaluation without intravenous contrast. Mild aortic atherosclerosis. No aneurysm. Normal cardiac size. No pericardial effusion Mediastinum/Nodes: Patent trachea. No thyroid mass. No suspicious lymph nodes. Esophagus within normal limits Lungs/Pleura: Chronic volume loss in the right upper and middle lobes. Mild ground-glass density, distortion and thickening of peribronchovascular interstitium now more closely resembles the exam from 2023. The previously noted consolidative density on the 12/21/2023 exam is resolved. There is bandlike atelectasis or scarring at the right middle lobe. No new parenchymal opacity. Upper Abdomen: No  acute finding Musculoskeletal: No acute osseous abnormality. Bulky thoracic osteophytes. IMPRESSION: 1. Chronic volume loss in the right upper and middle lobes with mild ground-glass density, distortion and thickening of peribronchovascular interstitium, now more closely resembling the exam from 2023 and likely due to chronic post infectious or post inflammatory scarring. The previously noted consolidative density in the right upper lobe on the 12/21/2023 exam is resolved. No new parenchymal opacity. 2. Aortic atherosclerosis. Aortic Atherosclerosis (ICD10-I70.0). Electronically Signed   By: Esmeralda Hedge M.D.   On: 01/05/2024 20:31    DG Chest Port 1 View Result Date: 01/05/2024 CLINICAL DATA:  [id number] with shortness of breath. EXAM: PORTABLE CHEST 1 VIEW COMPARISON:  Portable chest 01/03/2024 FINDINGS: 6:19 Vincen Bejar.m. There is Vidyuth Belsito cone shaped plastic structure superimposing over the outer right upper to mid lung field. There is increased opacity in the underlying right mid lung which could be developing infiltrate or due to overlying artifact related to the plastic device. The lungs are hypoexpanded and otherwise generally clear. There is mild cardiomegaly but no evidence of CHF. The mediastinum is stable with aortic tortuosity and patchy calcification. There is thoracic spondylosis and bridging enthesopathy with no new osseous findings. IMPRESSION: 1. Increased opacity in the right peripheral mid lung which could be developing infiltrate or due to overlying artifact related to Heitor Steinhoff cone shaped plastic device. 2. Hypoexpanded and otherwise clear lungs. 3. Mild cardiomegaly. 4. Aortic atherosclerosis. Electronically Signed   By: Denman Fischer M.D.   On: 01/05/2024 06:32        Scheduled Meds:  atorvastatin   40 mg Oral Daily   enoxaparin  (LOVENOX ) injection  40 mg Subcutaneous Q24H   furosemide   40 mg Intravenous Once   ipratropium-albuterol   3 mL Nebulization Q4H   NIFEdipine   30 mg Oral Daily   predniSONE   50 mg Oral Q breakfast   sodium chloride  flush  3 mL Intravenous Q12H   Continuous Infusions:  cefTRIAXone  (ROCEPHIN )  IV 2 g (01/06/24 0642)   doxycycline  (VIBRAMYCIN ) IV 100 mg (01/06/24 0828)     LOS: 2 days    Time spent: over 30 min    Donnetta Gains, MD [facility]   To contact the attending provider between 7A-7P or the covering provider during after hours 7P-7A, please log into the web site [email or web address] and access using universal [place] password for that web site. If you do not have the password, please call the hospital operator.  01/06/2024, 2:27 PM

## 2024-01-06 NOTE — Plan of Care (Signed)

## 2024-01-06 NOTE — Care Management Important Message (Signed)
 Important Message  Patient Details  Name: Michelle Rios MRN: [id number] Date of Birth: 1955-10-23   Important Message Given:  Yes - Medicare IM     [name] 01/06/2024, 1:43 PM

## 2024-01-07 DIAGNOSIS — R062 Wheezing: Secondary | ICD-10-CM | POA: Diagnosis not present

## 2024-01-07 LAB — CBC
HCT: 28.8 % — ABNORMAL LOW (ref 36.0–46.0)
Hemoglobin: 10.4 g/dL — ABNORMAL LOW (ref 12.0–15.0)
MCH: 37.8 pg — ABNORMAL HIGH (ref 26.0–34.0)
MCHC: 36.1 g/dL — ABNORMAL HIGH (ref 30.0–36.0)
MCV: 104.7 fL — ABNORMAL HIGH (ref 80.0–100.0)
Platelets: 370 10*3/uL (ref 150–400)
RBC: 2.75 MIL/uL — ABNORMAL LOW (ref 3.87–5.11)
RDW: 12.4 % (ref 11.5–15.5)
WBC: 9.5 10*3/uL (ref 4.0–10.5)
nRBC: 0 % (ref 0.0–0.2)

## 2024-01-07 LAB — BASIC METABOLIC PANEL WITH GFR
Anion gap: 11 (ref 5–15)
BUN: 24 mg/dL — ABNORMAL HIGH (ref 8–23)
CO2: 29 mmol/L (ref 22–32)
Calcium: 9.1 mg/dL (ref 8.9–10.3)
Chloride: 98 mmol/L (ref 98–111)
Creatinine, Ser: 1.01 mg/dL — ABNORMAL HIGH (ref 0.44–1.00)
GFR, Estimated: >60 mL/min (ref >60)
Glucose, Bld: 138 mg/dL — ABNORMAL HIGH (ref 70–99)
Potassium: 3.5 mmol/L (ref 3.5–5.1)
Sodium: 138 mmol/L (ref 135–145)

## 2024-01-07 MED ORDER — DOXYCYCLINE HYCLATE 100 MG PO TABS
100.0000 mg | ORAL_TABLET | Freq: Two times a day (BID) | ORAL | Status: DC
  Administered 2024-01-07 – 2024-01-09 (×4): 100 mg via ORAL
  Filled 2024-01-07 (×4): qty 1

## 2024-01-07 MED ORDER — FUROSEMIDE 10 MG/ML IJ SOLN
40.0000 mg | Freq: Once | INTRAMUSCULAR | Status: AC
  Administered 2024-01-07: 40 mg via INTRAVENOUS
  Filled 2024-01-07: qty 4

## 2024-01-07 NOTE — Plan of Care (Signed)

## 2024-01-07 NOTE — Progress Notes (Signed)
 PROGRESS NOTE    [name]  ZOX:[id number] DOB: [birth year] DOA: 01/03/2024 PCP: [facility], [facility] Complaint  Patient presents with   Shortness of Breath    Brief Narrative:   Michelle Rios is Michelle Rios 68 y.o. female with medical history significant of hyperlipidemia, anemia, sarcoidosis, obesity, asthma presenting with worsening shortness of breath.   Admitted for asthma exacerbation.   Assessment & Plan:   Principal Problem:   Asthma exacerbation Active Problems:   Sarcoidosis of lung (HCC)   Essential hypertension   Obesity, Class III, BMI 40-49.9 (morbid obesity)   Anemia of chronic disease   HLD (hyperlipidemia)   Asthma, chronic, unspecified asthma severity, with acute exacerbation   Wheezing  Asthma exacerbation Pulmonary sarcoid Shortness of Breath  > Presenting with worsening shortness of breath and wheezing for 2 days.  Continued wheezing and symptoms despite Solu-Medrol  and multiple nebulizer treatments.  No hypoxia. > Was recent admitted for pneumonia and exacerbation (CT 5/28 with RUL pneumonia vs mass), remained on low-dose steroids for this but completed antibiotics.   - negative covid, flu, RSV - CXR without new airspace consolidation, edema, or effusion - persistent prominence of R hilar region with decreased size of masslike consolidation.  Recommending repeat 2 view CXR in 6-12 weeks.  - CXR 6/12 with opacity in R peripheral mid lung (infiltrate or related to overlying artifact) - CT chest with chronic volume loss in the R upper and middle lobes with mild ground glass density, distortion, and thickening of peribronchovascular interstitium (likely chronic post infectious or post inflammatory scarring) - negative CT neck - follow on abx, ceftriaxone /doxycycline  - will give lasix  as tolerated  - given continued wheezing, will discuss with pulmonology (will plan for 6/15 am) - Monitor on telemetry overnight - Continue daily steroids - scheduled and  prn nebs - needs controller medicine on discharge - needs pulmonary follow up outpatient   Hypertension - Continue home med nifedipine    Hyperlipidemia - Continue atorvastatin    Anemia - mild, stable, trend   Obesity - Noted Body mass index is 45.03 kg/m.  Thyroid Nodule - needs US     DVT prophylaxis: lovenox  Code Status: full Family Communication: none Disposition:   Status is: Observation The patient will require care spanning > 2 midnights and should be moved to inpatient because: ongoing respiratory distress   Consultants:  none  Procedures:  none  Antimicrobials:  Anti-infectives (From admission, onward)    Start     Dose/Rate Route Frequency Ordered Stop   01/07/24 2200  doxycycline  (VIBRA -TABS) tablet 100 mg        100 mg Oral Every 12 hours 01/07/24 1241     01/05/24 0730  cefTRIAXone  (ROCEPHIN ) 2 g in sodium chloride  0.9 % 100 mL IVPB        2 g 200 mL/hr over 30 Minutes Intravenous Every 24 hours 01/05/24 0638     01/05/24 0730  doxycycline  (VIBRAMYCIN ) 100 mg in sodium chloride  0.9 % 250 mL IVPB  Status:  Discontinued        100 mg 125 mL/hr over 120 Minutes Intravenous Every 12 hours 01/05/24 0638 01/07/24 1241       Subjective: Still SOB   Objective: Vitals:   01/07/24 0836 01/07/24 1109 01/07/24 1528 01/07/24 1631  BP: 119/76 121/72 (!) 107/94   Pulse: 87 86 90   Resp: 16 18    Temp: 98 F (36.7 C) 98.4 F (36.9 C) 98.6 F (37 C)   TempSrc:  Oral Oral   SpO2: 98% 96% 97% 97%  Weight:      Height:        Intake/Output Summary (Last 24 hours) at 01/07/2024 1834 Last data filed at 01/07/2024 1332 Gross per 24 hour  Intake 100 ml  Output --  Net 100 ml    Filed Weights   01/03/24 1514 01/03/24 2300  Weight: 117 kg 108.1 kg    Examination:  General: No acute distress. Cardiovascular: RRR Lungs: continued wheezing, diminished air movement Abdomen: Soft, nontender, nondistended Neurological: Alert and oriented 3. Moves all  extremities 4 with equal strength. Cranial nerves II through XII grossly intact. Skin: Warm and dry. No rashes or lesions. Extremities: lymphedema   Data Reviewed: I have personally reviewed following labs and imaging studies  CBC: Recent Labs  Lab 01/03/24 1601 01/04/24 0402 01/05/24 0428 01/06/24 0831 01/07/24 0906  WBC 7.3 6.8 12.3* 7.6 9.5  NEUTROABS 6.2  --  9.3* 4.6  --   HGB 11.6* 11.0* 10.4* 10.6* 10.4*  HCT 34.3* 32.5* 29.3* 29.0* 28.8*  MCV 103.9* 101.9* 103.2* 103.2* 104.7*  PLT 369 360 380 380 370    Basic Metabolic Panel: Recent Labs  Lab 01/03/24 1601 01/04/24 0402 01/05/24 0428 01/06/24 0831 01/07/24 0906  NA 137 136 136 141 138  K 4.4 4.6 4.2 3.8 3.5  CL 101 101 102 104 98  CO2 22 24 25 25 29   GLUCOSE 229* 167* 128* 101* 138*  BUN 20 17 17 20  24*  CREATININE 1.06* 0.96 0.95 0.95 1.01*  CALCIUM  8.9 9.1 8.9 9.4 9.1  MG  --   --  2.1 2.0  --   PHOS  --   --  2.9 2.9  --     GFR: Estimated Creatinine Clearance: 61.4 mL/min (Lalani Winkles) (by C-G formula based on SCr of 1.01 mg/dL (H)).  Liver Function Tests: Recent Labs  Lab 01/03/24 1601 01/04/24 0402 01/05/24 0428 01/06/24 0831  AST 37 32 25 19  ALT 27 28 26 23   ALKPHOS 98 94 80 78  BILITOT 0.3 0.7 0.6 0.5  PROT 7.4 7.4 7.0 7.2  ALBUMIN  3.5 3.3* 3.4* 3.5    CBG: No results for input(s): GLUCAP in the last 168 hours.   Recent Results (from the past 240 hours)  Resp panel by RT-PCR (RSV, Flu Arilynn Blakeney&B, Covid) Anterior Nasal Swab     Status: None   Collection Time: 01/03/24 11:01 PM   Specimen: Anterior Nasal Swab  Result Value Ref Range Status   SARS Coronavirus 2 by RT PCR NEGATIVE NEGATIVE Final   Influenza Markia Kyer by PCR NEGATIVE NEGATIVE Final   Influenza B by PCR NEGATIVE NEGATIVE Final    Comment: (NOTE) The Xpert Xpress SARS-CoV-2/FLU/RSV plus assay is intended as an aid in the diagnosis of influenza from Nasopharyngeal swab specimens and should not be used as Dickson Kostelnik sole basis for treatment.  Nasal washings and aspirates are unacceptable for Xpert Xpress SARS-CoV-2/FLU/RSV testing.  Fact Sheet for Patients: BloggerCourse.com  Fact Sheet for Healthcare Providers: SeriousBroker.it  This test is not yet approved or cleared by the [place]  FDA and has been authorized for detection and/or diagnosis of SARS-CoV-2 by FDA under an Emergency Use Authorization (EUA). This EUA will remain in effect (meaning this test can be used) for the duration of the COVID-19 declaration under Section 564(b)(1) of the Act, 21 U.S.C. section 360bbb-3(b)(1), unless the authorization is terminated or revoked.     Resp Syncytial Virus by PCR NEGATIVE NEGATIVE Final  Comment: (NOTE) Fact Sheet for Patients: BloggerCourse.com  Fact Sheet for Healthcare Providers: SeriousBroker.it  This test is not yet approved or cleared by the [place]  FDA and has been authorized for detection and/or diagnosis of SARS-CoV-2 by FDA under an Emergency Use Authorization (EUA). This EUA will remain in effect (meaning this test can be used) for the duration of the COVID-19 declaration under Section 564(b)(1) of the Act, 21 U.S.C. section 360bbb-3(b)(1), unless the authorization is terminated or revoked.  Performed at [facility], [address], [place], [place] [address]          Radiology Studies: No results found.       Scheduled Meds:  atorvastatin   40 mg Oral Daily   doxycycline   100 mg Oral Q12H   enoxaparin  (LOVENOX ) injection  40 mg Subcutaneous Q24H   furosemide   40 mg Intravenous Once   ipratropium-albuterol   3 mL Nebulization Q4H   NIFEdipine   30 mg Oral Daily   predniSONE   50 mg Oral Q breakfast   sodium chloride  flush  3 mL Intravenous Q12H   Continuous Infusions:  cefTRIAXone  (ROCEPHIN )  IV 2 g (01/07/24 0641)     LOS: 3 days    Time spent: over 30  min    Donnetta Gains, MD [facility]   To contact the attending provider between 7A-7P or the covering provider during after hours 7P-7A, please log into the web site [email or web address] and access using universal [place] password for that web site. If you do not have the password, please call the hospital operator.  01/07/2024, 6:34 PM

## 2024-01-08 DIAGNOSIS — R062 Wheezing: Secondary | ICD-10-CM | POA: Diagnosis not present

## 2024-01-08 LAB — CBC WITH DIFFERENTIAL/PLATELET
Abs Immature Granulocytes: 0.02 10*3/uL (ref 0.00–0.07)
Basophils Absolute: 0 10*3/uL (ref 0.0–0.1)
Basophils Relative: 0 %
Eosinophils Absolute: 0 10*3/uL (ref 0.0–0.5)
Eosinophils Relative: 0 %
HCT: 31.4 % — ABNORMAL LOW (ref 36.0–46.0)
Hemoglobin: 10.6 g/dL — ABNORMAL LOW (ref 12.0–15.0)
Immature Granulocytes: 0 %
Lymphocytes Relative: 32 %
Lymphs Abs: 2.7 10*3/uL (ref 0.7–4.0)
MCH: 35.1 pg — ABNORMAL HIGH (ref 26.0–34.0)
MCHC: 33.8 g/dL (ref 30.0–36.0)
MCV: 104 fL — ABNORMAL HIGH (ref 80.0–100.0)
Monocytes Absolute: 0.6 10*3/uL (ref 0.1–1.0)
Monocytes Relative: 7 %
Neutro Abs: 5.2 10*3/uL (ref 1.7–7.7)
Neutrophils Relative %: 61 %
Platelets: 241 10*3/uL (ref 150–400)
RBC: 3.02 MIL/uL — ABNORMAL LOW (ref 3.87–5.11)
RDW: 12.4 % (ref 11.5–15.5)
WBC: 8.5 10*3/uL (ref 4.0–10.5)
nRBC: 0 % (ref 0.0–0.2)

## 2024-01-08 LAB — COMPREHENSIVE METABOLIC PANEL WITH GFR
ALT: 22 U/L (ref 0–44)
AST: 19 U/L (ref 15–41)
Albumin: 3.4 g/dL — ABNORMAL LOW (ref 3.5–5.0)
Alkaline Phosphatase: 73 U/L (ref 38–126)
Anion gap: 14 (ref 5–15)
BUN: 29 mg/dL — ABNORMAL HIGH (ref 8–23)
CO2: 25 mmol/L (ref 22–32)
Calcium: 9.3 mg/dL (ref 8.9–10.3)
Chloride: 100 mmol/L (ref 98–111)
Creatinine, Ser: 1 mg/dL (ref 0.44–1.00)
GFR, Estimated: >60 mL/min (ref >60)
Glucose, Bld: 99 mg/dL (ref 70–99)
Potassium: 4.1 mmol/L (ref 3.5–5.1)
Sodium: 139 mmol/L (ref 135–145)
Total Bilirubin: 0.7 mg/dL (ref 0.0–1.2)
Total Protein: 6.8 g/dL (ref 6.5–8.1)

## 2024-01-08 LAB — PHOSPHORUS: Phosphorus: 3.9 mg/dL (ref 2.5–4.6)

## 2024-01-08 LAB — MAGNESIUM: Magnesium: 2 mg/dL (ref 1.7–2.4)

## 2024-01-08 MED ORDER — AMOXICILLIN 500 MG PO CAPS: 1000.0000 mg | ORAL_CAPSULE | Freq: Three times a day (TID) | ORAL | 0 refills | Status: AC

## 2024-01-08 MED ORDER — TIOTROPIUM BROMIDE MONOHYDRATE 18 MCG IN CAPS: 18.0000 ug | ORAL_CAPSULE | Freq: Every day | RESPIRATORY_TRACT | 12 refills | Status: DC

## 2024-01-08 MED ORDER — PREDNISONE 10 MG PO TABS: ORAL_TABLET | ORAL | 0 refills | Status: AC

## 2024-01-08 MED ORDER — DOXYCYCLINE HYCLATE 100 MG PO TABS: 100.0000 mg | ORAL_TABLET | Freq: Two times a day (BID) | ORAL | 0 refills | Status: AC

## 2024-01-08 NOTE — Plan of Care (Signed)

## 2024-01-08 NOTE — Progress Notes (Signed)
 SATURATION QUALIFICATIONS:   Patient Saturations on Room Air at Rest = 99%  Patient Saturations on Room Air while Ambulating = 93%

## 2024-01-08 NOTE — Progress Notes (Signed)
 PT Cancellation Note  Patient Details Name: Michelle Rios MRN: [id number] DOB: [birth year]   Cancelled Treatment:    Reason Eval/Treat Not Completed: Other (comment). Dr. Ada Acres present upon PT arrival. Pt declines the need for PT evaluation at this time, per MD no need for evaluation currently, PT signing off.   Rexie Catena 01/08/2024, 12:40 PM

## 2024-01-08 NOTE — Plan of Care (Signed)
  Problem: Education: Goal: Knowledge of General Education information will improve Description: Including pain rating scale, medication(s)/side effects and non-pharmacologic comfort measures Outcome: Progressing   Problem: Health Behavior/Discharge Planning: Goal: Ability to manage health-related needs will improve Outcome: Progressing   Problem: Clinical Measurements: Goal: Cardiovascular complication will be avoided Outcome: Progressing   Problem: Activity: Goal: Risk for activity intolerance will decrease Outcome: Progressing   Problem: Nutrition: Goal: Adequate nutrition will be maintained Outcome: Progressing   Problem: Elimination: Goal: Will not experience complications related to bowel motility Outcome: Progressing   Problem: Elimination: Goal: Will not experience complications related to urinary retention Outcome: Progressing   Problem: Safety: Goal: Ability to remain free from injury will improve Outcome: Progressing   Problem: Skin Integrity: Goal: Risk for impaired skin integrity will decrease Outcome: Progressing

## 2024-01-08 NOTE — Discharge Summary (Signed)
 Physician Discharge Summary  Michelle Rios ZOX:[id number] DOB: [birth year] DOA: 01/03/2024  PCP: [facility] [address] [facility]  Admit date: 01/03/2024 Discharge date: 01/08/2024  Time spent: 40 minutes  Recommendations for Outpatient Follow-up:  Follow outpatient CBC/CMP  Follow with pulmonology outpatient Needs thyroid ultrasound outpatient  ? VCD, consider ENT follow up   Discharge Diagnoses:  Principal Problem:   Asthma exacerbation Active Problems:   Sarcoidosis of lung (HCC)   Essential hypertension   Obesity, Class III, BMI 40-49.9 (morbid obesity)   Anemia of chronic disease   HLD (hyperlipidemia)   Asthma, chronic, unspecified asthma severity, with acute exacerbation   Wheezing   Discharge Condition: stable  Diet recommendation: heart healthy  Filed Weights   01/03/24 1514 01/03/24 2300  Weight: 117 kg 108.1 kg    History of present illness:   Michelle Rios is Michelle Rios 68 y.o. female with medical history significant of hyperlipidemia, anemia, sarcoidosis, obesity, asthma presenting with worsening shortness of breath.    Admitted for asthma exacerbation.   She's gradually improved.  See below for additional details.  Hospital Course:  Assessment and Plan:  Asthma exacerbation Pulmonary sarcoid Shortness of Breath  > Presenting with worsening shortness of breath and wheezing for 2 days.  Continued wheezing and symptoms despite Solu-Medrol  and multiple nebulizer treatments.  No hypoxia. > Was recent admitted for pneumonia and exacerbation (CT 5/28 with RUL pneumonia vs mass), remained on low-dose steroids for this but completed antibiotics.   - spirometry 03/20/2018 with poor tracings with f/v loop typical of vcd - negative covid, flu, RSV - CT chest with chronic volume loss in the R upper and middle lobes with mild ground glass density, distortion, and thickening of peribronchovascular interstitium (likely chronic post infectious or post inflammatory scarring) (previously  noted consolidative density on 12/21/23 exam is resolved) - negative CT neck - will d/c with amox/doxy - given inconsistent wheezing, I think ok to follow outpatient - when she didn't see me in room, no audible wheezing, but when we interacted, audible wheezing from bedside.  In hall walking with RN today, no audible wheezing.  ? Component of vocal cord dysfunction.  (Note, her PFT's in 2019 reportedly with poor tracings typical of VCD) - steroid taper, spiriva at discharge - needs pulmonary follow up outpatient    Hypertension - Continue home med nifedipine    Hyperlipidemia - Continue atorvastatin    Anemia - mild, stable, trend   Obesity - Noted Body mass index is 45.03 kg/m.   Thyroid Nodule - needs US  outpatient     Procedures: none   Consultations: none  Discharge Exam: Vitals:   01/08/24 0400 01/08/24 0756  BP: (!) 121/58   Pulse: 78   Resp: 18   Temp: 98.1 F (36.7 C)   SpO2: 97% 99%   No complaints Eager to go home  General: No acute distress. Cardiovascular: RRR Lungs: when I walked in room, she was laying in bed on her phone, no audible wheezing or increased WOB.  Once she noticed me in the room, audible wheezing from bedside and increased WOB.  Transmitted upper airway sounds and diffuse wheezing on exam.  When walking with RN in hall, no audible wheezing and no increased WOB noted.   Abdomen: Soft, nontender, nondistended Neurological: Alert and oriented 3. Moves all extremities 4 with equal strength. Cranial nerves II through XII grossly intact. Extremities: chronic lymphdedema  Discharge Instructions   Discharge Instructions     Call MD for:  difficulty breathing, headache or  visual disturbances   Complete by: As directed    Call MD for:  extreme fatigue   Complete by: As directed    Call MD for:  hives   Complete by: As directed    Call MD for:  persistant dizziness or light-headedness   Complete by: As directed    Call MD for:   persistant nausea and vomiting   Complete by: As directed    Call MD for:  redness, tenderness, or signs of infection (pain, swelling, redness, odor or green/yellow discharge around incision site)   Complete by: As directed    Call MD for:  severe uncontrolled pain   Complete by: As directed    Call MD for:  temperature >100.4   Complete by: As directed    Diet - low sodium heart healthy   Complete by: As directed    Discharge instructions   Complete by: As directed    You were seen for an asthma exacerbation.  You've improved with steroids and breathing treatments.  I'm referring you to the lung doctors outpatient.   I'll start you on spiriva to see if this helps with your symptoms.  We'll send you home with Michelle Rios steroid taper and antibiotics.  Use albuterol  as needed for shortness of breath.  You have Michelle Rios thyroid nodule which needs outpatient follow up with an ultrasound.   Return for new, recurrent or worsening symptoms.    Please ask your PCP to request records from this hospitalization so they know what was done and what the next steps will be.   Increase activity slowly   Complete by: As directed    Pulmonary Visit   Complete by: As directed    Concern for asthma vs COPD   Reason for referral: Other Pulmonary      Allergies as of 01/08/2024       Reactions   Ivp Dye [iodinated Contrast Media] Anaphylaxis, Swelling   Voltaren  [diclofenac  Sodium] Nausea And Vomiting, Other (See Comments)   Bad taste in mouth Mouth and tongue turned black Vomiting thick, white mucous         Medication List     TAKE these medications    albuterol  108 (90 Base) MCG/ACT inhaler Commonly known as: VENTOLIN  HFA Inhale 2 puffs into the lungs every 6 (six) hours as needed for wheezing or shortness of breath.   amoxicillin  500 MG capsule Commonly known as: AMOXIL  Take 2 capsules (1,000 mg total) by mouth 3 (three) times daily for 3 days.   atorvastatin  40 MG tablet Commonly known as:  LIPITOR Take 40 mg by mouth daily.   doxycycline  100 MG tablet Commonly known as: VIBRA -TABS Take 1 tablet (100 mg total) by mouth every 12 (twelve) hours for 2 days.   FT Tussin Adult 200 MG/10ML liquid Generic drug: guaiFENesin  Take 5 mLs by mouth every 4 (four) hours as needed for cough or to loosen phlegm.   nabumetone  500 MG tablet Commonly known as: RELAFEN  Take 1 tablet (500 mg total) by mouth daily.   NIFEdipine  30 MG 24 hr tablet Commonly known as: PROCARDIA -XL/NIFEDICAL-XL Take 30 mg by mouth daily.   oxyCODONE  5 MG immediate release tablet Commonly known as: Oxy IR/ROXICODONE  Take 1 tablet (5 mg total) by mouth every 6 (six) hours as needed for severe pain (pain score 7-10).   predniSONE  10 MG tablet Commonly known as: DELTASONE  Take 4 tablets (40 mg total) by mouth daily with breakfast for 2 days, THEN 3 tablets (30 mg  total) daily with breakfast for 2 days, THEN 2 tablets (20 mg total) daily with breakfast for 2 days, THEN 1 tablet (10 mg total) daily with breakfast for 2 days. Start taking on: January 09, 2024 What changed:  medication strength See the new instructions.   tiotropium 18 MCG inhalation capsule Commonly known as: Spiriva HandiHaler Place 1 capsule (18 mcg total) into inhaler and inhale daily.       Allergies  Allergen Reactions   Ivp Dye [Iodinated Contrast Media] Anaphylaxis and Swelling   Voltaren  [Diclofenac  Sodium] Nausea And Vomiting and Other (See Comments)    Bad taste in mouth Mouth and tongue turned black Vomiting thick, white mucous     Follow-up Information     [facility] Follow up.   Why: Call to schedule hospital follow up appointment Contact information: [address] [place] [address] [phone]                  The results of significant diagnostics from this hospitalization (including imaging, microbiology, ancillary and laboratory) are listed below for reference.    Significant Diagnostic  Studies: CT SOFT TISSUE NECK WO CONTRAST Result Date: 01/05/2024 CLINICAL DATA:  Initial evaluation for worsening shortness of breath, stridor. History of asthma, sarcoidosis. EXAM: CT NECK WITHOUT CONTRAST TECHNIQUE: Multidetector CT imaging of the neck was performed following the standard protocol without intravenous contrast. RADIATION DOSE REDUCTION: This exam was performed according to the departmental dose-optimization program which includes automated exposure control, adjustment of the mA and/or kV according to patient size and/or use of iterative reconstruction technique. COMPARISON:  Comparison made with concomitant CT of the chest formed at the same time. FINDINGS: Pharynx and larynx: Examination somewhat limited by lack of IV contrast and positioning. Oral cavity within normal limits. Oropharynx and nasopharynx within normal limits without discrete mass or inflammatory changes. No visible retropharyngeal collection. Epiglottis within normal limits. Vallecula clear. Hypopharynx and supraglottic larynx grossly within normal limits. Glottis closed but grossly symmetric and within normal limits. Subglottic airway patent clear. Visualized esophagus within normal limits. Salivary glands: Salivary glands including the parotid and submandibular glands are within normal limits. Thyroid: Suspected left thyroid nodule measuring approximately 1.5 cm at the lower pole of the left thyroid lobe (series 4, image 53). Lymph nodes: No visible enlarged or pathologic lymph nodes within the neck. Vascular: Atheromatous change about the aortic arch and left carotid bulb. Evaluation of vascular structures otherwise limited by lack of IV contrast. Limited intracranial: Unremarkable. Visualized orbits: Unremarkable. Mastoids and visualized paranasal sinuses: Scattered mucoperiosteal thickening mucosal thickening present throughout the visualized paranasal sinuses. Visualized mastoids in middle ear cavities are clear. Skeleton:  No discrete or worrisome osseous lesions. Mild to moderate degenerative spondylosis at C5-6 and C6-7. Degenerative spurring within the visualized upper thoracic spine. Upper chest: Scattered ground-glass density with bronchiectasis and scarring noted within the visualized right upper lobe, better evaluated on concomitant chest CT. Other: None. IMPRESSION: 1. Negative CT of the neck. No acute inflammatory changes or other abnormality identified. 2. Scattered ground-glass density with bronchiectasis and scarring within the visualized right upper lobe, better evaluated on concomitant chest CT. 3.  Aortic Atherosclerosis (ICD10-I70.0). 4. Suspected 1.5 cm left thyroid nodule. Further evaluation with dedicated thyroid ultrasound recommended (ref: J Am Coll Radiol. 2015 Feb;12(2): 143-50). Electronically Signed   By: Virgia Griffins M.D.   On: 01/05/2024 22:15   CT CHEST WO CONTRAST Result Date: 01/05/2024 CLINICAL DATA:  Shortness of breath EXAM: CT CHEST WITHOUT CONTRAST  TECHNIQUE: Multidetector CT imaging of the chest was performed following the standard protocol without IV contrast. RADIATION DOSE REDUCTION: This exam was performed according to the departmental dose-optimization program which includes automated exposure control, adjustment of the mA and/or kV according to patient size and/or use of iterative reconstruction technique. COMPARISON:  01/05/2024 chest x-ray, CT chest 12/13/2023, 04/19/2022 FINDINGS: Cardiovascular: Limited evaluation without intravenous contrast. Mild aortic atherosclerosis. No aneurysm. Normal cardiac size. No pericardial effusion Mediastinum/Nodes: Patent trachea. No thyroid mass. No suspicious lymph nodes. Esophagus within normal limits Lungs/Pleura: Chronic volume loss in the right upper and middle lobes. Mild ground-glass density, distortion and thickening of peribronchovascular interstitium now more closely resembles the exam from 2023. The previously noted consolidative  density on the 12/21/2023 exam is resolved. There is bandlike atelectasis or scarring at the right middle lobe. No new parenchymal opacity. Upper Abdomen: No acute finding Musculoskeletal: No acute osseous abnormality. Bulky thoracic osteophytes. IMPRESSION: 1. Chronic volume loss in the right upper and middle lobes with mild ground-glass density, distortion and thickening of peribronchovascular interstitium, now more closely resembling the exam from 2023 and likely due to chronic post infectious or post inflammatory scarring. The previously noted consolidative density in the right upper lobe on the 12/21/2023 exam is resolved. No new parenchymal opacity. 2. Aortic atherosclerosis. Aortic Atherosclerosis (ICD10-I70.0). Electronically Signed   By: Esmeralda Hedge M.D.   On: 01/05/2024 20:31   DG Chest Port 1 View Result Date: 01/05/2024 CLINICAL DATA:  [id number] with shortness of breath. EXAM: PORTABLE CHEST 1 VIEW COMPARISON:  Portable chest 01/03/2024 FINDINGS: 6:19 Corinne Goucher.m. There is Marian Meneely cone shaped plastic structure superimposing over the outer right upper to mid lung field. There is increased opacity in the underlying right mid lung which could be developing infiltrate or due to overlying artifact related to the plastic device. The lungs are hypoexpanded and otherwise generally clear. There is mild cardiomegaly but no evidence of CHF. The mediastinum is stable with aortic tortuosity and patchy calcification. There is thoracic spondylosis and bridging enthesopathy with no new osseous findings. IMPRESSION: 1. Increased opacity in the right peripheral mid lung which could be developing infiltrate or due to overlying artifact related to Javen Ridings cone shaped plastic device. 2. Hypoexpanded and otherwise clear lungs. 3. Mild cardiomegaly. 4. Aortic atherosclerosis. Electronically Signed   By: Denman Fischer M.D.   On: 01/05/2024 06:32   DG Chest Portable 1 View Result Date: 01/03/2024 CLINICAL DATA:  sob EXAM: PORTABLE CHEST - 1  VIEW COMPARISON:  03/22/2024 FINDINGS: Persistent prominence of the right hilar region with overall decrease in size of the masslike consolidation in the interim. No new airspace consolidation, pleural effusion, or pneumothorax. No cardiomegaly. Tortuous aorta with aortic atherosclerosis. No acute fracture or destructive lesion. Multilevel thoracic osteophytosis. IMPRESSION: Persistent prominence of the right hilar region with overall decreased size of the masslike consolidation in the interim. No new airspace consolidation, pulmonary edema, or pleural effusion. Dawn Convery repeat two-view chest radiograph in 6-12 weeks is recommended to document resolution. Electronically Signed   By: Rance Burrows M.D.   On: 01/03/2024 15:39   CT CHEST WO CONTRAST Result Date: 12/21/2023 CLINICAL DATA:  Respiratory illness. EXAM: CT CHEST WITHOUT CONTRAST TECHNIQUE: Multidetector CT imaging of the chest was performed following the standard protocol without IV contrast. RADIATION DOSE REDUCTION: This exam was performed according to the departmental dose-optimization program which includes automated exposure control, adjustment of the mA and/or kV according to patient size and/or use of iterative reconstruction technique. COMPARISON:  Chest  radiograph dated 12/21/2023. Chest CT dated 04/19/2022. FINDINGS: Evaluation of this exam is limited in the absence of intravenous contrast. Cardiovascular: There is no cardiomegaly or pericardial effusion. Mild atherosclerotic calcification of the thoracic aorta. No aneurysmal dilatation. The central pulmonary arteries are grossly unremarkable. Mediastinum/Nodes: No obvious hilar or mediastinal adenopathy. Evaluation however is limited in the absence of intravenous contrast and due to respiratory motion. The esophagus is grossly unremarkable. No mediastinal fluid collection. Lungs/Pleura: Consolidative changes of the right upper lobe extending into the right hilum may be infectious in etiology.  Underlying mass is not excluded clinical correlation and close follow-up after treatment recommended to document resolution. Probable trace left pleural effusion or left lung base atelectasis. There is no pneumothorax. The central airways are patent. Upper Abdomen: No acute abnormality. Musculoskeletal: Degenerative changes of the spine. No acute osseous pathology. IMPRESSION: 1. Right upper lobe pneumonia versus mass. Clinical correlation and close follow-up after treatment recommended to document resolution. 2.  Aortic Atherosclerosis (ICD10-I70.0). Electronically Signed   By: Angus Bark M.D.   On: 12/21/2023 17:56   DG Chest Portable 1 View Result Date: 12/21/2023 CLINICAL DATA:  SOB EXAM: PORTABLE CHEST - 1 VIEW COMPARISON:  Dec 21, 2023 FINDINGS: Lower lung volumes. Unchanged masslike opacity in the right suprahilar region. Streaky opacities in both lung bases, which may represent atelectasis. No focal airspace consolidation, pleural effusion, or pneumothorax. No cardiomegaly. Tortuous aorta with aortic atherosclerosis. No acute fracture or destructive lesion. Multilevel thoracic osteophytosis. Bilateral glenohumeral joint osteoarthritis. IMPRESSION: Low lung volumes. Unchanged masslike opacity in the right suprahilar region. Angelica Wix chest CT with IV contrast is recommended for further characterization. Electronically Signed   By: Rance Burrows M.D.   On: 12/21/2023 16:13   DG Chest 2 View Result Date: 12/21/2023 CLINICAL DATA:  Productive cough EXAM: CHEST - 2 VIEW COMPARISON:  Chest radiograph 04/19/2022 FINDINGS: Stable cardiac contours. Masslike enlargement of the right hilar region. Additionally there is focal consolidation/mass within the right mid lung. No pleural effusion or pneumothorax. Thoracic spine degenerative changes. IMPRESSION: Masslike enlargement of the right hilar region. Considerations include prominent hilar structures versus adenopathy versus mass. Additionally there is focal  consolidation/mass within the right mid lung. Findings are nonspecific in etiology. Considerations include infection or underlying mass lesion. Given the complex findings, recommend further evaluation with contrast-enhanced chest CT. Electronically Signed   By: Jone Neither M.D.   On: 12/21/2023 14:53    Microbiology: Recent Results (from the past 240 hours)  Resp panel by RT-PCR (RSV, Flu Martesha Niedermeier&B, Covid) Anterior Nasal Swab     Status: None   Collection Time: 01/03/24 11:01 PM   Specimen: Anterior Nasal Swab  Result Value Ref Range Status   SARS Coronavirus 2 by RT PCR NEGATIVE NEGATIVE Final   Influenza Greyson Peavy by PCR NEGATIVE NEGATIVE Final   Influenza B by PCR NEGATIVE NEGATIVE Final    Comment: (NOTE) The Xpert Xpress SARS-CoV-2/FLU/RSV plus assay is intended as an aid in the diagnosis of influenza from Nasopharyngeal swab specimens and should not be used as Ryenn Howeth sole basis for treatment. Nasal washings and aspirates are unacceptable for Xpert Xpress SARS-CoV-2/FLU/RSV testing.  Fact Sheet for Patients: BloggerCourse.com  Fact Sheet for Healthcare Providers: SeriousBroker.it  This test is not yet approved or cleared by the [place]  FDA and has been authorized for detection and/or diagnosis of SARS-CoV-2 by FDA under an Emergency Use Authorization (EUA). This EUA will remain in effect (meaning this test can be used) for the duration of the  COVID-19 declaration under Section 564(b)(1) of the Act, 21 U.S.C. section 360bbb-3(b)(1), unless the authorization is terminated or revoked.     Resp Syncytial Virus by PCR NEGATIVE NEGATIVE Final    Comment: (NOTE) Fact Sheet for Patients: BloggerCourse.com  Fact Sheet for Healthcare Providers: SeriousBroker.it  This test is not yet approved or cleared by the [place]  FDA and has been authorized for detection and/or diagnosis of SARS-CoV-2  by FDA under an Emergency Use Authorization (EUA). This EUA will remain in effect (meaning this test can be used) for the duration of the COVID-19 declaration under Section 564(b)(1) of the Act, 21 U.S.C. section 360bbb-3(b)(1), unless the authorization is terminated or revoked.  Performed at [facility], [address] [place], [place] [address]      Labs: Basic Metabolic Panel: Recent Labs  Lab 01/04/24 0402 01/05/24 0428 01/06/24 0831 01/07/24 0906 01/08/24 0553  NA 136 136 141 138 139  K 4.6 4.2 3.8 3.5 4.1  CL 101 102 104 98 100  CO2 24 25 25 29 25   GLUCOSE 167* 128* 101* 138* 99  BUN 17 17 20  24* 29*  CREATININE 0.96 0.95 0.95 1.01* 1.00  CALCIUM  9.1 8.9 9.4 9.1 9.3  MG  --  2.1 2.0  --  2.0  PHOS  --  2.9 2.9  --  3.9   Liver Function Tests: Recent Labs  Lab 01/03/24 1601 01/04/24 0402 01/05/24 0428 01/06/24 0831 01/08/24 0553  AST 37 32 25 19 19   ALT 27 28 26 23 22   ALKPHOS 98 94 80 78 73  BILITOT 0.3 0.7 0.6 0.5 0.7  PROT 7.4 7.4 7.0 7.2 6.8  ALBUMIN  3.5 3.3* 3.4* 3.5 3.4*   No results for input(s): LIPASE, AMYLASE in the last 168 hours. No results for input(s): AMMONIA in the last 168 hours. CBC: Recent Labs  Lab 01/03/24 1601 01/04/24 0402 01/05/24 0428 01/06/24 0831 01/07/24 0906 01/08/24 0553  WBC 7.3 6.8 12.3* 7.6 9.5 8.5  NEUTROABS 6.2  --  9.3* 4.6  --  5.2  HGB 11.6* 11.0* 10.4* 10.6* 10.4* 10.6*  HCT 34.3* 32.5* 29.3* 29.0* 28.8* 31.4*  MCV 103.9* 101.9* 103.2* 103.2* 104.7* 104.0*  PLT 369 360 380 380 370 241   Cardiac Enzymes: No results for input(s): CKTOTAL, CKMB, CKMBINDEX, TROPONINI in the last 168 hours. BNP: BNP (last 3 results) Recent Labs    12/21/23 1500 01/04/24 0402 01/06/24 0831  BNP 87.1 476.1* 24.8    ProBNP (last 3 results) No results for input(s): PROBNP in the last 8760 hours.  CBG: No results for input(s): GLUCAP in the last 168 hours.     Signed:  Donnetta Gains  MD.  [facility] 01/08/2024, 12:06 PM

## 2024-01-09 ENCOUNTER — Inpatient Hospital Stay (HOSPITAL_COMMUNITY)

## 2024-01-09 ENCOUNTER — Telehealth: Payer: Self-pay | Admitting: Acute Care

## 2024-01-09 DIAGNOSIS — R062 Wheezing: Secondary | ICD-10-CM | POA: Diagnosis not present

## 2024-01-09 DIAGNOSIS — R008 Other abnormalities of heart beat: Secondary | ICD-10-CM

## 2024-01-09 LAB — ECHOCARDIOGRAM COMPLETE
AR max vel: 2.15 cm2
AV Area VTI: 2.12 cm2
AV Area mean vel: 2.06 cm2
AV Mean grad: 6 mmHg
AV Peak grad: 10.9 mmHg
Ao pk vel: 1.65 m/s
Area-P 1/2: 3.16 cm2
Height: 61 in
S' Lateral: 2.8 cm
Weight: 3813.08 [oz_av]

## 2024-01-09 MED ORDER — AMOXICILLIN 500 MG PO CAPS
1000.0000 mg | ORAL_CAPSULE | Freq: Three times a day (TID) | ORAL | Status: DC
  Administered 2024-01-09 (×2): 1000 mg via ORAL
  Filled 2024-01-09 (×2): qty 2

## 2024-01-09 MED ORDER — IPRATROPIUM-ALBUTEROL 0.5-2.5 (3) MG/3ML IN SOLN
3.0000 mL | Freq: Three times a day (TID) | RESPIRATORY_TRACT | Status: DC
  Administered 2024-01-09: 3 mL via RESPIRATORY_TRACT
  Filled 2024-01-09: qty 3

## 2024-01-09 NOTE — Telephone Encounter (Signed)
 Can I add patient into blocked slot? No earlier availability, please advise.

## 2024-01-09 NOTE — Progress Notes (Signed)
*  PRELIMINARY RESULTS* Echocardiogram 2D Echocardiogram has been performed.  Glenna Lango 01/09/2024, 11:37 AM

## 2024-01-09 NOTE — Discharge Summary (Addendum)
 Physician Discharge Summary  RONNITA PAZ UEA:[id number] DOB: Jun 09, 1956 DOA: 01/03/2024  PCP: [facility] [address]  Admit date: 01/03/2024 Discharge date: 01/09/2024  Time spent: 40 minutes  Recommendations for Outpatient Follow-up:  Follow outpatient CBC/CMP  Follow with pulmonology outpatient Needs thyroid ultrasound outpatient  ? VCD, consider ENT follow up   Discharge Diagnoses:  Principal Problem:   Asthma exacerbation Active Problems:   Sarcoidosis of lung (HCC)   Essential hypertension   Obesity, Class III, BMI 40-49.9 (morbid obesity)   Anemia of chronic disease   HLD (hyperlipidemia)   Asthma, chronic, unspecified asthma severity, with acute exacerbation   Wheezing   Discharge Condition: stable  Diet recommendation: heart healthy  Filed Weights   01/03/24 1514 01/03/24 2300  Weight: 117 kg 108.1 kg    History of present illness:   Michelle Rios is Michelle Rios 68 y.o. female with medical history significant of hyperlipidemia, anemia, sarcoidosis, obesity, asthma presenting with worsening shortness of breath.    Admitted for asthma exacerbation.   She's gradually improved.  See below for additional details.  Discharge delayed 6/15 due to concern from nursing and daughter with regards to SOB, but echo reassuring 6/16 and she's continued to improve.  Requested earlier follow up with pulmonology.    Hospital Course:  Assessment and Plan:  Asthma exacerbation Pulmonary sarcoid Shortness of Breath  > Presenting with worsening shortness of breath and wheezing for 2 days.  Continued wheezing and symptoms despite Solu-Medrol  and multiple nebulizer treatments.  No hypoxia. > Was recent admitted for pneumonia and exacerbation (CT 5/28 with RUL pneumonia vs mass), remained on low-dose steroids for this but completed antibiotics.   - spirometry 03/20/2018 with poor tracings with f/v loop typical of vcd - negative covid, flu, RSV - CT chest with chronic volume loss in the R  upper and middle lobes with mild ground glass density, distortion, and thickening of peribronchovascular interstitium (likely chronic post infectious or post inflammatory scarring) (previously noted consolidative density on 12/21/23 exam is resolved) - negative CT neck - will d/c with amox/doxy - wheezing is improved (generally more notable when I interact with her, sometimes when watching from door, no wheezing, then worse when we speak).  ? Component of vocal cord dysfunction.  (Note, her PFT's in 2019 reportedly with poor tracings typical of VCD) - steroid taper, spiriva at discharge - needs pulmonary follow up outpatient  - echo notable for EF 55-60%, no RWMA, grade 1 diastolic dysfunction   Hypertension - Continue home med nifedipine    Hyperlipidemia - Continue atorvastatin    Anemia - mild, stable, trend   Obesity - Noted Body mass index is 45.03 kg/m.   Thyroid Nodule - needs US  outpatient     Procedures: none   Consultations: none  Discharge Exam: Vitals:   01/09/24 0756 01/09/24 1440  BP: 112/70   Pulse: 87 84  Resp: 19 18  Temp: 99 F (37.2 C)   SpO2: 93%    No new complaints  General: No acute distress. Cardiovascular: RRR Lungs: Clear to auscultation bilaterally - some transmitted upper airway sounds, but in general improved wheezing than on prior days Abdomen: Soft, nontender, nondistended Neurological: Alert and oriented 3. Moves all extremities 4. Cranial nerves II through XII grossly intact. Skin: Warm and dry. No rashes or lesions. Extremities: lymphedema   Discharge Instructions   Discharge Instructions     Call MD for:  difficulty breathing, headache or visual disturbances   Complete by: As directed  Call MD for:  extreme fatigue   Complete by: As directed    Call MD for:  hives   Complete by: As directed    Call MD for:  persistant dizziness or light-headedness   Complete by: As directed    Call MD for:  persistant nausea and  vomiting   Complete by: As directed    Call MD for:  redness, tenderness, or signs of infection (pain, swelling, redness, odor or green/yellow discharge around incision site)   Complete by: As directed    Call MD for:  severe uncontrolled pain   Complete by: As directed    Call MD for:  temperature >100.4   Complete by: As directed    Diet - low sodium heart healthy   Complete by: As directed    Discharge instructions   Complete by: As directed    You were seen for an asthma exacerbation.  You've improved with steroids and breathing treatments.  I'm referring you to the lung doctors outpatient.  I asked them to see if they can move your appointment up.  They'll look for availability and reach out to you if they have an open slot.   I'll start you on spiriva to see if this helps with your symptoms.  We'll send you home with Zeniyah Peaster steroid taper and antibiotics.  Use albuterol  as needed for shortness of breath.  You have Arkeem Harts thyroid nodule which needs outpatient follow up with an ultrasound.   You had an ultrasound of your heart which was reassuring.   Return for new, recurrent or worsening symptoms.    Please ask your PCP to request records from this hospitalization so they know what was done and what the next steps will be.   Increase activity slowly   Complete by: As directed    Pulmonary Visit   Complete by: As directed    Concern for asthma vs COPD   Reason for referral: Other Pulmonary      Allergies as of 01/09/2024       Reactions   Ivp Dye [iodinated Contrast Media] Anaphylaxis, Swelling   Voltaren  [diclofenac  Sodium] Nausea And Vomiting, Other (See Comments)   Bad taste in mouth Mouth and tongue turned black Vomiting thick, white mucous         Medication List     TAKE these medications    albuterol  108 (90 Base) MCG/ACT inhaler Commonly known as: VENTOLIN  HFA Inhale 2 puffs into the lungs every 6 (six) hours as needed for wheezing or shortness of breath.    amoxicillin  500 MG capsule Commonly known as: AMOXIL  Take 2 capsules (1,000 mg total) by mouth 3 (three) times daily for 3 days.   atorvastatin  40 MG tablet Commonly known as: LIPITOR Take 40 mg by mouth daily.   doxycycline  100 MG tablet Commonly known as: VIBRA -TABS Take 1 tablet (100 mg total) by mouth every 12 (twelve) hours for 2 days.   FT Tussin Adult 200 MG/10ML liquid Generic drug: guaiFENesin  Take 5 mLs by mouth every 4 (four) hours as needed for cough or to loosen phlegm.   nabumetone  500 MG tablet Commonly known as: RELAFEN  Take 1 tablet (500 mg total) by mouth daily.   NIFEdipine  30 MG 24 hr tablet Commonly known as: PROCARDIA -XL/NIFEDICAL-XL Take 30 mg by mouth daily.   oxyCODONE  5 MG immediate release tablet Commonly known as: Oxy IR/ROXICODONE  Take 1 tablet (5 mg total) by mouth every 6 (six) hours as needed for severe pain (pain score  7-10).   predniSONE  10 MG tablet Commonly known as: DELTASONE  Take 4 tablets (40 mg total) by mouth daily with breakfast for 2 days, THEN 3 tablets (30 mg total) daily with breakfast for 2 days, THEN 2 tablets (20 mg total) daily with breakfast for 2 days, THEN 1 tablet (10 mg total) daily with breakfast for 2 days. Start taking on: January 09, 2024 What changed:  medication strength See the new instructions.   tiotropium 18 MCG inhalation capsule Commonly known as: Spiriva HandiHaler Place 1 capsule (18 mcg total) into inhaler and inhale daily.        Allergies  Allergen Reactions   Ivp Dye [Iodinated Contrast Media] Anaphylaxis and Swelling   Voltaren  [Diclofenac  Sodium] Nausea And Vomiting and Other (See Comments)    Bad taste in mouth Mouth and tongue turned black Vomiting thick, white mucous     Follow-up Information     [facility] Follow up.   Why: Call to schedule hospital follow up appointment Contact information: [address] [place] [address] [phone]                  The  results of significant diagnostics from this hospitalization (including imaging, microbiology, ancillary and laboratory) are listed below for reference.    Significant Diagnostic Studies: ECHOCARDIOGRAM COMPLETE Result Date: 01/09/2024    ECHOCARDIOGRAM REPORT   Patient Name:   ISALY FASCHING Date of Exam: 01/09/2024 Medical Rec #:  [id number]        Height:       61.0 in Accession #:    [id number]       Weight:       238.3 lb Date of Birth:  21-Jul-1956        BSA:          2.035 m Patient Age:    67 years         BP:           121/63 mmHg Patient Gender: F                HR:           78 bpm. Exam Location:  Inpatient Procedure: 2D Echo, Cardiac Doppler and Color Doppler (Both Spectral and Color            Flow Doppler were utilized during procedure). Indications:    Other abnormalities of the heart R00.8  History:        Patient has no prior history of Echocardiogram examinations.  Sonographer:    Jeralene Mom Referring Phys: [phone] Marlita Keil CALDWELL POWELL JR IMPRESSIONS  1. Left ventricular ejection fraction, by estimation, is 55 to 60%. The left ventricle has normal function. The left ventricle has no regional wall motion abnormalities. Left ventricular diastolic parameters are consistent with Grade I diastolic dysfunction (impaired relaxation). Elevated left ventricular end-diastolic pressure.  2. Right ventricular systolic function is normal. The right ventricular size is normal. There is normal pulmonary artery systolic pressure.  3. The mitral valve is normal in structure. No evidence of mitral valve regurgitation. No evidence of mitral stenosis.  4. The aortic valve is normal in structure. Aortic valve regurgitation is not visualized. No aortic stenosis is present.  5. The inferior vena cava is normal in size with greater than 50% respiratory variability, suggesting right atrial pressure of 3 mmHg. FINDINGS  Left Ventricle: Left ventricular ejection fraction, by estimation, is 55 to 60%. The left  ventricle has normal function. The left  ventricle has no regional wall motion abnormalities. The left ventricular internal cavity size was normal in size. There is  no left ventricular hypertrophy. Left ventricular diastolic parameters are consistent with Grade I diastolic dysfunction (impaired relaxation). Elevated left ventricular end-diastolic pressure. Right Ventricle: The right ventricular size is normal. No increase in right ventricular wall thickness. Right ventricular systolic function is normal. There is normal pulmonary artery systolic pressure. The tricuspid regurgitant velocity is 2.29 m/s, and  with an assumed right atrial pressure of 3 mmHg, the estimated right ventricular systolic pressure is 24.0 mmHg. Left Atrium: Left atrial size was normal in size. Right Atrium: Right atrial size was normal in size. Pericardium: There is no evidence of pericardial effusion. Mitral Valve: The mitral valve is normal in structure. No evidence of mitral valve regurgitation. No evidence of mitral valve stenosis. Tricuspid Valve: The tricuspid valve is normal in structure. Tricuspid valve regurgitation is trivial. No evidence of tricuspid stenosis. Aortic Valve: The aortic valve is normal in structure. Aortic valve regurgitation is not visualized. No aortic stenosis is present. Aortic valve mean gradient measures 6.0 mmHg. Aortic valve peak gradient measures 10.9 mmHg. Aortic valve area, by VTI measures 2.12 cm. Pulmonic Valve: The pulmonic valve was normal in structure. Pulmonic valve regurgitation is not visualized. No evidence of pulmonic stenosis. Aorta: The aortic root is normal in size and structure. Venous: The inferior vena cava is normal in size with greater than 50% respiratory variability, suggesting right atrial pressure of 3 mmHg. IAS/Shunts: No atrial level shunt detected by color flow Doppler.  LEFT VENTRICLE PLAX 2D LVIDd:         4.10 cm   Diastology LVIDs:         2.80 cm   LV e' medial:    4.90 cm/s  LV PW:         0.90 cm   LV E/e' medial:  21.2 LV IVS:        0.90 cm   LV e' lateral:   7.83 cm/s LVOT diam:     2.00 cm   LV E/e' lateral: 13.3 LV SV:         66 LV SV Index:   32 LVOT Area:     3.14 cm  RIGHT VENTRICLE RV Basal diam:  3.40 cm RV Mid diam:    2.80 cm RV S prime:     14.50 cm/s TAPSE (M-mode): 2.2 cm LEFT ATRIUM             Index        RIGHT ATRIUM          Index LA diam:        3.00 cm 1.47 cm/m   RA Area:     8.53 cm LA Vol (A2C):   26.3 ml 12.92 ml/m  RA Volume:   15.30 ml 7.52 ml/m LA Vol (A4C):   16.2 ml 7.99 ml/m LA Biplane Vol: 20.0 ml 9.83 ml/m  AORTIC VALVE AV Area (Vmax):    2.15 cm AV Area (Vmean):   2.06 cm AV Area (VTI):     2.12 cm AV Vmax:           165.00 cm/s AV Vmean:          110.500 cm/s AV VTI:            0.310 m AV Peak Grad:      10.9 mmHg AV Mean Grad:      6.0 mmHg LVOT Vmax:  113.00 cm/s LVOT Vmean:        72.400 cm/s LVOT VTI:          0.210 m LVOT/AV VTI ratio: 0.68  AORTA Ao Root diam: 2.70 cm MITRAL VALVE                TRICUSPID VALVE MV Area (PHT): 3.16 cm     TR Peak grad:   21.0 mmHg MV Decel Time: 240 msec     TR Vmax:        229.00 cm/s MV E velocity: 104.00 cm/s MV Jaylanni Eltringham velocity: 106.00 cm/s  SHUNTS MV E/Nikoloz Huy ratio:  0.98         Systemic VTI:  0.21 m                             Systemic Diam: 2.00 cm Maudine Sos MD Electronically signed by Maudine Sos MD Signature Date/Time: 01/09/2024/4:14:14 PM    Final    CT SOFT TISSUE NECK WO CONTRAST Result Date: 01/05/2024 CLINICAL DATA:  Initial evaluation for worsening shortness of breath, stridor. History of asthma, sarcoidosis. EXAM: CT NECK WITHOUT CONTRAST TECHNIQUE: Multidetector CT imaging of the neck was performed following the standard protocol without intravenous contrast. RADIATION DOSE REDUCTION: This exam was performed according to the departmental dose-optimization program which includes automated exposure control, adjustment of the mA and/or kV according to patient size and/or use  of iterative reconstruction technique. COMPARISON:  Comparison made with concomitant CT of the chest formed at the same time. FINDINGS: Pharynx and larynx: Examination somewhat limited by lack of IV contrast and positioning. Oral cavity within normal limits. Oropharynx and nasopharynx within normal limits without discrete mass or inflammatory changes. No visible retropharyngeal collection. Epiglottis within normal limits. Vallecula clear. Hypopharynx and supraglottic larynx grossly within normal limits. Glottis closed but grossly symmetric and within normal limits. Subglottic airway patent clear. Visualized esophagus within normal limits. Salivary glands: Salivary glands including the parotid and submandibular glands are within normal limits. Thyroid: Suspected left thyroid nodule measuring approximately 1.5 cm at the lower pole of the left thyroid lobe (series 4, image 53). Lymph nodes: No visible enlarged or pathologic lymph nodes within the neck. Vascular: Atheromatous change about the aortic arch and left carotid bulb. Evaluation of vascular structures otherwise limited by lack of IV contrast. Limited intracranial: Unremarkable. Visualized orbits: Unremarkable. Mastoids and visualized paranasal sinuses: Scattered mucoperiosteal thickening mucosal thickening present throughout the visualized paranasal sinuses. Visualized mastoids in middle ear cavities are clear. Skeleton: No discrete or worrisome osseous lesions. Mild to moderate degenerative spondylosis at C5-6 and C6-7. Degenerative spurring within the visualized upper thoracic spine. Upper chest: Scattered ground-glass density with bronchiectasis and scarring noted within the visualized right upper lobe, better evaluated on concomitant chest CT. Other: None. IMPRESSION: 1. Negative CT of the neck. No acute inflammatory changes or other abnormality identified. 2. Scattered ground-glass density with bronchiectasis and scarring within the visualized right upper  lobe, better evaluated on concomitant chest CT. 3.  Aortic Atherosclerosis (ICD10-I70.0). 4. Suspected 1.5 cm left thyroid nodule. Further evaluation with dedicated thyroid ultrasound recommended (ref: J Am Coll Radiol. 2015 Feb;12(2): 143-50). Electronically Signed   By: Virgia Griffins M.D.   On: 01/05/2024 22:15   CT CHEST WO CONTRAST Result Date: 01/05/2024 CLINICAL DATA:  Shortness of breath EXAM: CT CHEST WITHOUT CONTRAST TECHNIQUE: Multidetector CT imaging of the chest was performed following the standard protocol without IV contrast. RADIATION DOSE REDUCTION:  This exam was performed according to the departmental dose-optimization program which includes automated exposure control, adjustment of the mA and/or kV according to patient size and/or use of iterative reconstruction technique. COMPARISON:  01/05/2024 chest x-ray, CT chest 12/13/2023, 04/19/2022 FINDINGS: Cardiovascular: Limited evaluation without intravenous contrast. Mild aortic atherosclerosis. No aneurysm. Normal cardiac size. No pericardial effusion Mediastinum/Nodes: Patent trachea. No thyroid mass. No suspicious lymph nodes. Esophagus within normal limits Lungs/Pleura: Chronic volume loss in the right upper and middle lobes. Mild ground-glass density, distortion and thickening of peribronchovascular interstitium now more closely resembles the exam from 2023. The previously noted consolidative density on the 12/21/2023 exam is resolved. There is bandlike atelectasis or scarring at the right middle lobe. No new parenchymal opacity. Upper Abdomen: No acute finding Musculoskeletal: No acute osseous abnormality. Bulky thoracic osteophytes. IMPRESSION: 1. Chronic volume loss in the right upper and middle lobes with mild ground-glass density, distortion and thickening of peribronchovascular interstitium, now more closely resembling the exam from 2023 and likely due to chronic post infectious or post inflammatory scarring. The previously noted  consolidative density in the right upper lobe on the 12/21/2023 exam is resolved. No new parenchymal opacity. 2. Aortic atherosclerosis. Aortic Atherosclerosis (ICD10-I70.0). Electronically Signed   By: Esmeralda Hedge M.D.   On: 01/05/2024 20:31   DG Chest Port 1 View Result Date: 01/05/2024 CLINICAL DATA:  [id number] with shortness of breath. EXAM: PORTABLE CHEST 1 VIEW COMPARISON:  Portable chest 01/03/2024 FINDINGS: 6:19 Davontay [place].m. There is Lulie Hurd cone shaped plastic structure superimposing over the outer right upper to mid lung field. There is increased opacity in the underlying right mid lung which could be developing infiltrate or due to overlying artifact related to the plastic device. The lungs are hypoexpanded and otherwise generally clear. There is mild cardiomegaly but no evidence of CHF. The mediastinum is stable with aortic tortuosity and patchy calcification. There is thoracic spondylosis and bridging enthesopathy with no new osseous findings. IMPRESSION: 1. Increased opacity in the right peripheral mid lung which could be developing infiltrate or due to overlying artifact related to Armarion Greek cone shaped plastic device. 2. Hypoexpanded and otherwise clear lungs. 3. Mild cardiomegaly. 4. Aortic atherosclerosis. Electronically Signed   By: Denman Fischer M.D.   On: 01/05/2024 06:32   DG Chest Portable 1 View Result Date: 01/03/2024 CLINICAL DATA:  sob EXAM: PORTABLE CHEST - 1 VIEW COMPARISON:  03/22/2024 FINDINGS: Persistent prominence of the right hilar region with overall decrease in size of the masslike consolidation in the interim. No new airspace consolidation, pleural effusion, or pneumothorax. No cardiomegaly. Tortuous aorta with aortic atherosclerosis. No acute fracture or destructive lesion. Multilevel thoracic osteophytosis. IMPRESSION: Persistent prominence of the right hilar region with overall decreased size of the masslike consolidation in the interim. No new airspace consolidation, pulmonary edema, or  pleural effusion. Maisyn Nouri repeat two-view chest radiograph in 6-12 weeks is recommended to document resolution. Electronically Signed   By: Rance Burrows M.D.   On: 01/03/2024 15:39   CT CHEST WO CONTRAST Result Date: 12/21/2023 CLINICAL DATA:  Respiratory illness. EXAM: CT CHEST WITHOUT CONTRAST TECHNIQUE: Multidetector CT imaging of the chest was performed following the standard protocol without IV contrast. RADIATION DOSE REDUCTION: This exam was performed according to the departmental dose-optimization program which includes automated exposure control, adjustment of the mA and/or kV according to patient size and/or use of iterative reconstruction technique. COMPARISON:  Chest radiograph dated 12/21/2023. Chest CT dated 04/19/2022. FINDINGS: Evaluation of this exam is limited in the absence of intravenous  contrast. Cardiovascular: There is no cardiomegaly or pericardial effusion. Mild atherosclerotic calcification of the thoracic aorta. No aneurysmal dilatation. The central pulmonary arteries are grossly unremarkable. Mediastinum/Nodes: No obvious hilar or mediastinal adenopathy. Evaluation however is limited in the absence of intravenous contrast and due to respiratory motion. The esophagus is grossly unremarkable. No mediastinal fluid collection. Lungs/Pleura: Consolidative changes of the right upper lobe extending into the right hilum may be infectious in etiology. Underlying mass is not excluded clinical correlation and close follow-up after treatment recommended to document resolution. Probable trace left pleural effusion or left lung base atelectasis. There is no pneumothorax. The central airways are patent. Upper Abdomen: No acute abnormality. Musculoskeletal: Degenerative changes of the spine. No acute osseous pathology. IMPRESSION: 1. Right upper lobe pneumonia versus mass. Clinical correlation and close follow-up after treatment recommended to document resolution. 2.  Aortic Atherosclerosis  (ICD10-I70.0). Electronically Signed   By: Angus Bark M.D.   On: 12/21/2023 17:56   DG Chest Portable 1 View Result Date: 12/21/2023 CLINICAL DATA:  SOB EXAM: PORTABLE CHEST - 1 VIEW COMPARISON:  Dec 21, 2023 FINDINGS: Lower lung volumes. Unchanged masslike opacity in the right suprahilar region. Streaky opacities in both lung bases, which may represent atelectasis. No focal airspace consolidation, pleural effusion, or pneumothorax. No cardiomegaly. Tortuous aorta with aortic atherosclerosis. No acute fracture or destructive lesion. Multilevel thoracic osteophytosis. Bilateral glenohumeral joint osteoarthritis. IMPRESSION: Low lung volumes. Unchanged masslike opacity in the right suprahilar region. Ivery Michalski chest CT with IV contrast is recommended for further characterization. Electronically Signed   By: Rance Burrows M.D.   On: 12/21/2023 16:13   DG Chest 2 View Result Date: 12/21/2023 CLINICAL DATA:  Productive cough EXAM: CHEST - 2 VIEW COMPARISON:  Chest radiograph 04/19/2022 FINDINGS: Stable cardiac contours. Masslike enlargement of the right hilar region. Additionally there is focal consolidation/mass within the right mid lung. No pleural effusion or pneumothorax. Thoracic spine degenerative changes. IMPRESSION: Masslike enlargement of the right hilar region. Considerations include prominent hilar structures versus adenopathy versus mass. Additionally there is focal consolidation/mass within the right mid lung. Findings are nonspecific in etiology. Considerations include infection or underlying mass lesion. Given the complex findings, recommend further evaluation with contrast-enhanced chest CT. Electronically Signed   By: Jone Neither M.D.   On: 12/21/2023 14:53    Microbiology: Recent Results (from the past 240 hours)  Resp panel by RT-PCR (RSV, Flu Demarus Latterell&B, Covid) Anterior Nasal Swab     Status: None   Collection Time: 01/03/24 11:01 PM   Specimen: Anterior Nasal Swab  Result Value Ref Range  Status   SARS Coronavirus 2 by RT PCR NEGATIVE NEGATIVE Final   Influenza An Schnabel by PCR NEGATIVE NEGATIVE Final   Influenza B by PCR NEGATIVE NEGATIVE Final    Comment: (NOTE) The Xpert Xpress SARS-CoV-2/FLU/RSV plus assay is intended as an aid in the diagnosis of influenza from Nasopharyngeal swab specimens and should not be used as Terryn Redner sole basis for treatment. Nasal washings and aspirates are unacceptable for Xpert Xpress SARS-CoV-2/FLU/RSV testing.  Fact Sheet for Patients: BloggerCourse.com  Fact Sheet for Healthcare Providers: SeriousBroker.it  This test is not yet approved or cleared by the [place]  FDA and has been authorized for detection and/or diagnosis of SARS-CoV-2 by FDA under an Emergency Use Authorization (EUA). This EUA will remain in effect (meaning this test can be used) for the duration of the COVID-19 declaration under Section 564(b)(1) of the Act, 21 U.S.C. section 360bbb-3(b)(1), unless the authorization is terminated or revoked.  Resp Syncytial Virus by PCR NEGATIVE NEGATIVE Final    Comment: (NOTE) Fact Sheet for Patients: BloggerCourse.com  Fact Sheet for Healthcare Providers: SeriousBroker.it  This test is not yet approved or cleared by the [place]  FDA and has been authorized for detection and/or diagnosis of SARS-CoV-2 by FDA under an Emergency Use Authorization (EUA). This EUA will remain in effect (meaning this test can be used) for the duration of the COVID-19 declaration under Section 564(b)(1) of the Act, 21 U.S.C. section 360bbb-3(b)(1), unless the authorization is terminated or revoked.  Performed at [facility], [address] [place], [place] [address]      Labs: Basic Metabolic Panel: Recent Labs  Lab 01/04/24 0402 01/05/24 0428 01/06/24 0831 01/07/24 0906 01/08/24 0553  NA 136 136 141 138 139  K 4.6 4.2 3.8 3.5  4.1  CL 101 102 104 98 100  CO2 24 25 25 29 25   GLUCOSE 167* 128* 101* 138* 99  BUN 17 17 20  24* 29*  CREATININE 0.96 0.95 0.95 1.01* 1.00  CALCIUM  9.1 8.9 9.4 9.1 9.3  MG  --  2.1 2.0  --  2.0  PHOS  --  2.9 2.9  --  3.9   Liver Function Tests: Recent Labs  Lab 01/03/24 1601 01/04/24 0402 01/05/24 0428 01/06/24 0831 01/08/24 0553  AST 37 32 25 19 19   ALT 27 28 26 23 22   ALKPHOS 98 94 80 78 73  BILITOT 0.3 0.7 0.6 0.5 0.7  PROT 7.4 7.4 7.0 7.2 6.8  ALBUMIN  3.5 3.3* 3.4* 3.5 3.4*   No results for input(s): LIPASE, AMYLASE in the last 168 hours. No results for input(s): AMMONIA in the last 168 hours. CBC: Recent Labs  Lab 01/03/24 1601 01/04/24 0402 01/05/24 0428 01/06/24 0831 01/07/24 0906 01/08/24 0553  WBC 7.3 6.8 12.3* 7.6 9.5 8.5  NEUTROABS 6.2  --  9.3* 4.6  --  5.2  HGB 11.6* 11.0* 10.4* 10.6* 10.4* 10.6*  HCT 34.3* 32.5* 29.3* 29.0* 28.8* 31.4*  MCV 103.9* 101.9* 103.2* 103.2* 104.7* 104.0*  PLT 369 360 380 380 370 241   Cardiac Enzymes: No results for input(s): CKTOTAL, CKMB, CKMBINDEX, TROPONINI in the last 168 hours. BNP: BNP (last 3 results) Recent Labs    12/21/23 1500 01/04/24 0402 01/06/24 0831  BNP 87.1 476.1* 24.8    ProBNP (last 3 results) No results for input(s): PROBNP in the last 8760 hours.  CBG: No results for input(s): GLUCAP in the last 168 hours.     Signed:  Donnetta Gains MD.  [facility] 01/09/2024, 4:17 PM

## 2024-01-09 NOTE — Telephone Encounter (Signed)
 You would need the providers okay on a blocked slot.

## 2024-01-09 NOTE — Plan of Care (Signed)

## 2024-01-11 NOTE — Telephone Encounter (Signed)
 I've never seen this patient, she sees Dr. Waymond Hailey

## 2024-01-11 NOTE — Telephone Encounter (Signed)
 Patient scheduled for 7/16 with Dione Franks.

## 2024-01-11 NOTE — Telephone Encounter (Signed)
 Routing to [employer] nurse to advise.

## 2024-01-11 NOTE — Telephone Encounter (Signed)
 Deneise Finlay D, NP to [facility] Admin     01/09/24  9:59 AM Patient is an established patient with Dr. Louie Rover, she is a currently admitted for acute asthma exacerbation.  Inpatient medical team is asking to move back her outpatient appointment with Dr. Dione Franks currently scheduled for 8/20 sooner.  Can this be done?   This pt has not been seen in clinic since 2019, therefore she can see any provider as a new consult. Does not have to be with [facility]. Routing back to admin for for scheduling.

## 2024-01-23 ENCOUNTER — Encounter: Payer: Self-pay | Admitting: Registered Nurse

## 2024-01-23 ENCOUNTER — Other Ambulatory Visit: Payer: Self-pay | Admitting: Registered Nurse

## 2024-01-23 DIAGNOSIS — Z8701 Personal history of pneumonia (recurrent): Secondary | ICD-10-CM

## 2024-01-24 ENCOUNTER — Ambulatory Visit
Admission: RE | Admit: 2024-01-24 | Discharge: 2024-01-24 | Disposition: A | Discharge status: Home / Self Care | Source: Ambulatory Visit | Attending: Registered Nurse | Admitting: Registered Nurse

## 2024-01-24 ENCOUNTER — Other Ambulatory Visit: Payer: Self-pay | Admitting: Registered Nurse

## 2024-01-24 DIAGNOSIS — Z8701 Personal history of pneumonia (recurrent): Secondary | ICD-10-CM

## 2024-01-24 DIAGNOSIS — E079 Disorder of thyroid, unspecified: Secondary | ICD-10-CM

## 2024-01-25 ENCOUNTER — Encounter (HOSPITAL_BASED_OUTPATIENT_CLINIC_OR_DEPARTMENT_OTHER): Payer: Self-pay | Admitting: Registered Nurse

## 2024-01-25 ENCOUNTER — Other Ambulatory Visit (HOSPITAL_BASED_OUTPATIENT_CLINIC_OR_DEPARTMENT_OTHER): Payer: Self-pay | Admitting: Registered Nurse

## 2024-01-25 DIAGNOSIS — Z780 Asymptomatic menopausal state: Secondary | ICD-10-CM

## 2024-02-08 ENCOUNTER — Encounter: Payer: Self-pay | Admitting: Internal Medicine

## 2024-02-08 ENCOUNTER — Ambulatory Visit (INDEPENDENT_AMBULATORY_CARE_PROVIDER_SITE_OTHER): Admitting: Internal Medicine

## 2024-02-08 VITALS — BP 136/84 | HR 103 | Temp 97.5°F | Ht 61.0 in | Wt 246.8 lb

## 2024-02-08 DIAGNOSIS — G4733 Obstructive sleep apnea (adult) (pediatric): Secondary | ICD-10-CM

## 2024-02-08 DIAGNOSIS — D860 Sarcoidosis of lung: Secondary | ICD-10-CM

## 2024-02-08 DIAGNOSIS — Z7722 Contact with and (suspected) exposure to environmental tobacco smoke (acute) (chronic): Secondary | ICD-10-CM | POA: Diagnosis not present

## 2024-02-08 NOTE — Progress Notes (Signed)
 Michelle Rios    [id number]    Nov 12, 1955  Primary Care Physician:Health, Izell Rubens  Referring Physician: Health, [facility] [address] [place],  [place] [address] Reason for Consultation: asthma Date of Consultation: 02/08/2024  Chief complaint:   Chief Complaint  Patient presents with   Medical Management of Chronic Issues    Hospital follow up     HPI: Discussed the use of AI scribe software for clinical note transcription with the patient, who gave verbal consent to proceed.  History of Present Illness Michelle Rios is a 68 year old female with sarcoidosis who presents with breathing difficulties. She was referred by Dr. Perri for follow-up after a severe bronchitis attack and pneumonia, treated with steroids, antibiotics and bronchodilators.   She has a history of sarcoidosis diagnosed in the late 1980s through bronchoscopy. From 2019 until her recent hospitalization, she reported doing okay and not having significant breathing issues. She was initially admitted for two days and then transferred to another facility, where she was hospitalized for about a week and a half with breathing problems and wheezing, which she had not experienced since 2019.  Currently, she continues to experience breathing issues, primarily at night, with symptoms of coughing and shortness of breath. She uses an albuterol  rescue inhaler as needed but has not been using Spiriva  due to cost, which was prescribed after her recent hospitalization. Her breathing issues are not as severe as they were during her hospitalization and she is feeling better. She says her mobility is limited by dyspnea and bone on bone osteoarthritis in her knees.   She has a history of pneumonia and was treated with antibiotics. She denies smoking but has been exposed to secondhand smoke from her father and husband, both deceased. She was surprised by a recent mention of COPD during her hospital stay, as she has  never smoked.  She experiences shortness of breath when walking and has knee pain. Her sleep is interrupted by coughing, but she denies current reflux or heartburn. She has gained six pounds in the past month and attributes some of her breathing difficulties to weight gain.  She has a family history of lung disease; her uncle, a smoker, had a lung removed and later died. 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 She denies any current alcohol or drug use.  She has witnessed apneas, snoring, and excessive daytime sleepiness.   Social history:  Occupation: home health, hospital health aide Exposures: lives at home  Smoking history: never smoker, passive smoke exposure in childhood.   Social History   Occupational History   Occupation: CNA  Tobacco Use   Smoking status: Never    Passive exposure: Past   Smokeless tobacco: Never  Vaping Use   Vaping status: Never Used  Substance and Sexual Activity   Alcohol use: No   Drug use: No   Sexual activity: Not Currently    Relevant family history:  Family History  Problem Relation Age of Onset   Diabetes Mother    Kidney disease Mother    Hyperlipidemia Father    Hypertension Father    Heart attack Father    Heart disease Father    Sudden death Father    Alcoholism Father    Breast cancer Sister    Kidney cancer Sister    Diabetes Brother    Stroke Paternal Grandmother    Cancer - Lung Paternal Uncle  Past Medical History:  Diagnosis Date   AKI (acute kidney injury) (HCC) 09/15/2015   Anemia    Arthritis    knees (02/20/2018)   Asthma    Bilateral swelling of feet    CAP (community acquired pneumonia) 04/11/2022   Community acquired pneumonia 12/21/2023   Constipation    Daily headache    Diarrhea 09/15/2015   Hiatal hernia    History of blood transfusion 1979   w/childbirth   Hypertension    Lactose intolerance    Osteoarthritis    Pulmonary embolism (HCC) 1990s X 1    Rheumatoid arthritis (HCC)    Sarcoidosis of lung (HCC)    SOB (shortness of breath)     Past Surgical History:  Procedure Laterality Date   ANKLE FRACTURE SURGERY Right    KNEE ARTHROSCOPY Right 1990s   TUBAL LIGATION  1990s   VAGINAL HYSTERECTOMY  1990s   WRIST FRACTURE SURGERY Left 2001     Physical Exam: Blood pressure 136/84, pulse (!) 103, temperature (!) 97.5 F (36.4 C), temperature source Oral, height 5' 1 (1.549 m), weight 246 lb 12.8 oz (111.9 kg), SpO2 95%. Gen:      No acute distress, obeses ENT:  mallampati IV, no thrush, no nasal polyps, mucus membranes moist Lungs:    No increased respiratory effort, symmetric chest wall excursion, clear to auscultation bilaterally, no wheezes or crackles CV:         Regular rate and rhythm; no murmurs, rubs, or gallops.  Non pitting lower extremity edema Abd:      + bowel sounds; soft, non-tender; no distension MSK: no acute synovitis of DIP or PIP joints, no mechanics hands.  Skin:      Warm and dry; no rashes Neuro: normal speech, no focal facial asymmetry Psych: alert and oriented x3, normal mood and affect   Data Reviewed/Medical Decision Making:  Independent interpretation of tests: Imaging:  Review of patient's CT Chest images revealed RUL ground glass consistent with infectious pneumonia no fibrotic lung disease. The patient's images have been independently reviewed by me.    PFTs: I have personally reviewed the patient's PFTs and      No data to display          Labs:  Lab Results  Component Value Date   NA 139 01/08/2024   K 4.1 01/08/2024   CO2 25 01/08/2024   GLUCOSE 99 01/08/2024   BUN 29 (H) 01/08/2024   CREATININE 1.00 01/08/2024   CALCIUM  9.3 01/08/2024   EGFR 82 08/05/2021   GFRNONAA >60 01/08/2024   Lab Results  Component Value Date   WBC 8.5 01/08/2024   HGB 10.6 (L) 01/08/2024   HCT 31.4 (L) 01/08/2024   MCV 104.0 (H) 01/08/2024   PLT 241 01/08/2024     Immunization status:   Immunization History  Administered Date(s) Administered   Fluad Quad(high Dose 65+) 04/13/2022   PNEUMOCOCCAL CONJUGATE-20 04/13/2022   Tdap 02/09/2017     I reviewed prior external note(s) from hospital stay, pulmonary  I reviewed the result(s) of the labs and imaging as noted above.   I have ordered pft, sleep study   Assessment and Plan Assessment & Plan Pulmonary Sarcoidosis Mild pulmonary sarcoidosis No recent pulmonary function testing. - Order pulmonary function testing. - Continue albuterol  as needed. - She wants to hold off on inhaler therapy at this time as she feels her dyspnea is related to weight gain. - Monitor ocular symptoms; ensure regular ophthalmology follow-up. Baseline eye  examination to evaluate for ocular sarcoidosis  She saw them in April 2025.  Baseline serum creatinine for renal sarcoidosis checked in June 2025 Baseline serum alkaline phosphatase for hepatic sarcoidosis - June 2025 Baseline serum calcium  - June 2025 Baseline serum complete blood count - June 2025 Baseline EKG for cardiac sarcoidosis reviewed 01/04/24 - no IV conduction delay, sinus tachycardia  Sleep apnea Suspected sleep apnea contributing to nocturnal symptoms and weight gain. - Order home sleep study. - Consider weight loss medication if sleep apnea is confirmed.  Obesity Obesity with recent weight gain, potentially exacerbating respiratory symptoms. - Discuss weight loss strategies with primary care. - Consider weight loss medication referral if sleep apnea is confirmed. - if moderate OSA  AHI>15 can get zepbound approved?  Reference: Diagnosis and Detection of Sarcoidosis: An [employer] Clinical Practice Guideline Am J Respir Crit Care Med Vol 201, Iss 8, pp e26-e51, Nov 08, 2018  We discussed disease management and progression at length today.   I spent 45 minutes in the care of this patient today including pre-charting, chart review, review of  results, face-to-face care, coordination of care and communication with consultants etc.).   Return to Care: Return in about 3 months (around 05/10/2024).  Verdon Gore, MD Pulmonary and Critical Care Medicine [facility] Office:[phone]  CC: Health, [address]

## 2024-02-08 NOTE — Patient Instructions (Addendum)
 It was a pleasure to see you today!  Please schedule follow up with myself in 3 months.  If my schedule is not open yet, we will contact you with a reminder closer to that time. Please call [phone] if you haven't heard from us  a month before, and always call us  sooner if issues or concerns arise. You can also send us  a message through MyChart, but but aware that this is not to be used for urgent issues and it may take up to 5-7 days to receive a reply. Please be aware that you will likely be able to view your results before I have a chance to respond to them. Please give us  5 business days to respond to any non-urgent results.    Before your next visit I would like you to have:  Full set of PFTs Home sleep study  VISIT SUMMARY:  Today, you were seen for follow-up after a severe bronchitis attack and ongoing breathing difficulties. We discussed your history of sarcoidosis, recent hospitalization, and current symptoms, including nighttime coughing and shortness of breath. We also reviewed your recent weight gain and its potential impact on your breathing. You do not have COPD! But your previous smoke exposure in childhood, your sarcoidosis, and your weight all affect your breathing.   YOUR PLAN:  -SARCOIDOSIS: Sarcoidosis is a condition where inflammatory cells grow in different parts of your body, often affecting the lungs. We will order pulmonary function testing to assess your lung function. Continue using your albuterol  inhaler as needed. You let me know you wanted to hold off on additional inhalers at this time.    -SLEEP APNEA: Sleep apnea is a condition where your breathing stops and starts during sleep, which can contribute to nighttime symptoms and weight gain. We will order a home sleep study to confirm this diagnosis. If sleep apnea is confirmed, we may consider weight loss medication to help manage your symptoms and maybe cpap therapy.   -OBESITY: Obesity means having an  excessive amount of body fat, which can worsen breathing problems. We discussed strategies for weight loss, and if sleep apnea is confirmed, we may refer you for weight loss medication.  INSTRUCTIONS:  Please follow up with the pulmonary function testing and home sleep study as ordered. Continue using your albuterol  inhaler as needed Discuss weight loss strategies with your primary care doctor, and we will consider weight loss medication if sleep apnea is confirmed.

## 2024-02-13 ENCOUNTER — Encounter: Payer: Self-pay | Admitting: Internal Medicine

## 2024-02-13 ENCOUNTER — Ambulatory Visit (HOSPITAL_BASED_OUTPATIENT_CLINIC_OR_DEPARTMENT_OTHER)
Admission: RE | Admit: 2024-02-13 | Discharge: 2024-02-13 | Disposition: A | Discharge status: Home / Self Care | Source: Ambulatory Visit | Attending: Registered Nurse | Admitting: Registered Nurse

## 2024-02-13 DIAGNOSIS — Z780 Asymptomatic menopausal state: Secondary | ICD-10-CM | POA: Insufficient documentation

## 2024-03-14 ENCOUNTER — Ambulatory Visit: Admitting: Internal Medicine

## 2024-03-18 NOTE — Progress Notes (Signed)
 Chief Complaint: Chief Complaint  Patient presents with  . Knee Pain    Left knee pain, discuss Sx    Reason for Visit: The patient is a 68 y.o. female who presents today with her daughter for reevaluation of her left knee. She reports a long history of left knee pain.  She localizes most of the pain along the medial and lateral aspect of the knee. She reports some swelling, no locking, and some giving way of the knee. She has appreciated progressive stiffness and limited range of motion. The pain is aggravated by any weight bearing. The knee pain limits the patient's ability to ambulate long distances. The patient has not appreciated any significant improvement despite Tylenol , NSAIDs, intraarticular corticosteroid injections, viscosupplementation, activity modification, and ambulatory aids. She is using a walker for ambulation. At home but presents today in a wheelchair. The patient states that the knee pain has progressed to the point that it is significantly interfering with her activities of daily living.  Medications: Current Outpatient Medications  Medication Sig Dispense Refill  . acetaminophen  (TYLENOL ) 325 MG tablet Take 650 mg by mouth once daily as needed for Pain    . atorvastatin  (LIPITOR) 40 MG tablet Take 40 mg by mouth once daily    . dicyclomine  (BENTYL ) 20 mg tablet Take 20 mg by mouth 2 (two) times daily    . ergocalciferol , vitamin D2, 1,250 mcg (50,000 unit) capsule Take 50,000 Units by mouth every 7 (seven) days    . hydroCHLOROthiazide  (HYDRODIURIL ) 12.5 MG tablet Take 12.5 mg by mouth once daily    . MYRBETRIQ 50 mg ER tablet Take 50 mg by mouth once daily    . nabumetone  (RELAFEN ) 500 MG tablet Take 500 mg by mouth once daily    . NIFEdipine  (ADALAT  CC) 30 MG ER tablet Take 30 mg by mouth once daily     No current facility-administered medications for this visit.    Allergies: Allergies  Allergen Reactions  . Iodinated Contrast Media Anaphylaxis and Swelling   . Diclofenac  Sodium Hives    Pt states had bad state in mouth, mouth and tongue turned black, and then pt vomited thick white mucous.    Past Medical History: Past Medical History:  Diagnosis Date  . Essential hypertension 02/09/2017  . Exacerbation of asthma (HHS-HCC) 12/21/2023  . Hx of pulmonary embolus   . Obesity, Class III, BMI 40-49.9 (morbid obesity) 02/09/2017  . Primary osteoarthritis of left knee 03/18/2024  . Sarcoidosis of lung (CMS/HHS-HCC) 02/09/2017    Past Surgical History: Past Surgical History:  Procedure Laterality Date  . HYSTERECTOMY SUPRACERVICAL ABDOMINAL W/REMOVAL TUBES &/OR OVARIES  1999  . ORIF left wrist fracture    . Right ankle fracture    . Right knee arthroscopy      Social History: Social History   Socioeconomic History  . Marital status: Single  Tobacco Use  . Smoking status: Never  . Smokeless tobacco: Never  Substance and Sexual Activity  . Alcohol use: Defer  . Drug use: Defer  . Sexual activity: Defer   Social Drivers of Health   Financial Resource Strain: Low Risk  (11/17/2023)   Overall Financial Resource Strain (CARDIA)   . Difficulty of Paying Living Expenses: Not hard at all  Food Insecurity: No Food Insecurity (01/03/2024)   Received from [facility]   Hunger Vital Sign   . Within the past 12 months, you worried that your food would run out before you got the money to buy  more.: Never true   . Within the past 12 months, the food you bought just didn't last and you didn't have money to get more.: Never true  Transportation Needs: No Transportation Needs (01/03/2024)   Received from [facility] - Transportation   . Lack of Transportation (Medical): No   . Lack of Transportation (Non-Medical): No  Social Connections: Moderately Integrated (01/03/2024)   Received from [facility]   Social Connection and Isolation Panel   . In a typical week, how many times do you talk on the phone with family, friends, or  neighbors?: More than three times a week   . How often do you get together with friends or relatives?: More than three times a week   . How often do you attend church or religious services?: More than 4 times per year   . Do you belong to any clubs or organizations such as church groups, unions, fraternal or athletic groups, or school groups?: Yes   . How often do you attend meetings of the clubs or organizations you belong to?: More than 4 times per year   . Are you married, widowed, divorced, separated, never married, or living with a partner?: Widowed  Recent Concern: Social Connections - Moderately Isolated (12/21/2023)   Received from [facility]   Social Connection and Isolation Panel   . In a typical week, how many times do you talk on the phone with family, friends, or neighbors?: More than three times a week   . How often do you get together with friends or relatives?: Three times a week   . How often do you attend church or religious services?: More than 4 times per year   . Do you belong to any clubs or organizations such as church groups, unions, fraternal or athletic groups, or school groups?: No   . How often do you attend meetings of the clubs or organizations you belong to?: Never   . Are you married, widowed, divorced, separated, never married, or living with a partner?: Widowed  Housing Stability: Unknown (11/17/2023)   Housing Stability Vital Sign   . Unable to Pay for Housing in the Last Year: No   . Homeless in the Last Year: No    Family History: No family history on file.  Review of Systems: A comprehensive 14 point ROS was performed, reviewed, and the pertinent orthopaedic findings are documented in the HPI.  Exam BP 130/84   Ht 154.9 cm (5' 1)   Wt (!) 114.3 kg (252 lb)   BMI 47.61 kg/m   General:  Well-developed, well-nourished female seen in no acute distress.  Gait was not assessed since the patient presented in a wheelchair.   HEENT:  Atraumatic,  normocephalic.  Pupils are equal and reactive to light.  Extraocular motion is intact. Sclera are clear.  Oropharynx is clear with moist mucosa.  Neck:  Supple, nontender, and with good ROM. No thyromegaly, adenopathy, JVD, or carotid bruits.  Lungs:  Clear to auscultation bilaterally.  Cardiovascular:  Regular rate and rhythm.  Normal S1, S2.  No murmur .  No appreciable gallops or rubs. Peripheral pulses are palpable.  Moderate to severe lower extremity edema.  Homan`s test is negative.  Abdomen:  Soft, nontender, nondistended.  Bowel sounds are present.  Extremities: Good strength, stability, and range of motion of the upper extremities. Good range of motion of the hips and ankles.  Left  Knee:    Soft tissue swelling: moderate  Effusion:  mild    Erythema:  none    Crepitance:  mild    Tenderness:  medial, lateral    Alignment:  relative varus    Mediolateral laxity: medial pseudolaxity    Posterior sag: negative    Patellar tracking: Good tracking without evidence of subluxation or tilt    Atrophy:  Generalized quadriceps atrophy.      Quadriceps tone was fair to good.    Range of motion: 0/5/95 degrees  Neurologic:  Awake, alert, and oriented.  Sensory function is intact to pinprick and light touch.   Motor strength is judged to be 5/5.   Motor coordination is within normal limits.   No apparent clonus. No tremor.    X-rays: I ordered and interpreted standing AP, lateral, and sunrise radiographs of the left knee that were obtained in the office today. There is significant narrowing of the medial cartilage space with bone-on-bone articulation and associated varus alignment.  Osteophyte formation is noted. Subchondral sclerosis is noted.  No evidence of fracture or dislocation.   Impression: Degenerative arthrosis of the left knee Morbid obesity  Plan:   The findings were discussed in detail with the patient. The patient was given informational material on total  knee replacement. Conservative treatment options were reviewed with the patient.  We discussed the risks and benefits of surgical intervention.  The usual perioperative course was also discussed in detail.  The patient expressed understanding of the risks and benefits of surgical intervention and would like to proceed with plans for left total knee arthroplasty.  Body mass index is 47.61 kg/m. Obesity has been associated with hypertension, diabetes, dyslipidemia, obstructive sleep apnea, arthritis, back pain, and depression. Obesity is most commonly caused by sedentary lifestyle and increased calorie intake. The patient should increase physical activity (especially low impact exercise) and reduce dietary intake (e.g. small portion via small plate size, avoid starving yourself, avoid highly processed food).  Body mass index greater than 40 has been associated with increased risk for perioperative complications including poor wound healing, surgical site infection, deep venous thrombosis, and cardiopulmonary complications. The patient needs to achieve a target weight of 212 pounds to reach a BMI of 40. We discussed delaying any consideration for surgical intervention until the patient reaches 232 pounds.  Wt Readings from Last 3 Encounters:  03/15/24 (!) 114.3 kg (252 lb)  11/17/23 (!) 109.4 kg (241 lb 3.2 oz)   The patient is followed by Dr. Verdon Gore [facility]) for sarcoidosis of the lungs. I reviewed the most recent notes. She is being scheduled for pulmonary function tests and a home sleep study (to evaluate for obstructive sleep apnea). Dr. Gore discussed the option of weight loss medication if sleep apnea is confirmed. I emphasized the importance of having her pulmonary status optimized before any surgery.  She also has a history of of pulmonary embolism. I discussed the potential benefit of an IVC filter during the perioperative period. I will refer her to [facility]  when we do schedule her surgery.  I spent a total of 50 minutes in both face-to-face and non-face-to-face activities, excluding procedures performed, for this visit on the date of this encounter.  MEDICAL CLEARANCE: Dr. Nikita Desai [facility]). ACTIVITY: As tolerated. WORK STATUS: Not applicable. THERAPY: Home exercise program for for quadriceps strengthening. I also recommended aquatics exercise. MEDICATIONS: Requested Prescriptions    No prescriptions requested or ordered in this encounter   FOLLOW-UP: Return in about 2 months (around 05/15/2024).  I will defer any plans for surgery until I reassess her progress with weight loss and pulmonary optimization at that time    Lynwood SQUIBB. Hooten, Jr., M.D.  This note was generated in part with voice recognition software and I apologize for any typographical errors that were not detected and corrected.

## 2024-03-31 NOTE — Telephone Encounter (Signed)
 Attempted to reach patient by phone to discuss lab results. No answer, voicemail box full. Will plan to review results at upcoming follow-up appointment.  Noted persistent macrocytic anemia with vitamin B12 deficiency and low transferrin saturation. Suspect chronic nutritional insufficiency as the cause of her anemia and micronutrient deficiencies, but will consider further evaluation at next visit. She may need a diagnostic endoscopy. For now, will initiate B12 and iron supplementation.  Also noted mildly elevated LDL with 15% 10-year risk of ASCVD, favoring statin therapy for primary prevention of ASCVD. Will resume previously prescribed rosuvastatin.  Normal liver function, kidney function, electrolytes, and A1c.   -- Prentice Curio, MD [facility]

## 2024-03-31 NOTE — Result Encounter Note (Signed)
 Reviewed results with patient/family via telephone. Please see Telephone Encounter for details.

## 2024-05-08 ENCOUNTER — Ambulatory Visit

## 2024-05-08 ENCOUNTER — Encounter: Payer: Self-pay | Admitting: Internal Medicine

## 2024-05-08 ENCOUNTER — Ambulatory Visit (INDEPENDENT_AMBULATORY_CARE_PROVIDER_SITE_OTHER): Admitting: Internal Medicine

## 2024-05-08 VITALS — BP 140/88 | HR 101 | Temp 98.6°F | Ht 61.0 in | Wt 251.0 lb

## 2024-05-08 DIAGNOSIS — Z5181 Encounter for therapeutic drug level monitoring: Secondary | ICD-10-CM

## 2024-05-08 DIAGNOSIS — D860 Sarcoidosis of lung: Secondary | ICD-10-CM

## 2024-05-08 DIAGNOSIS — Z6841 Body Mass Index (BMI) 40.0 and over, adult: Secondary | ICD-10-CM

## 2024-05-08 DIAGNOSIS — G4730 Sleep apnea, unspecified: Secondary | ICD-10-CM | POA: Diagnosis not present

## 2024-05-08 DIAGNOSIS — E669 Obesity, unspecified: Secondary | ICD-10-CM

## 2024-05-08 LAB — PULMONARY FUNCTION TEST
DL/VA % pred: 109 %
DL/VA: 4.61 ml/min/mmHg/L
DLCO cor % pred: 62 %
DLCO cor: 11.38 ml/min/mmHg
DLCO unc % pred: 56 %
DLCO unc: 10.26 ml/min/mmHg
FEF 25-75 Post: 0.67 L/s
FEF 25-75 Pre: 0.55 L/s
FEF2575-%Change-Post: 21 %
FEF2575-%Pred-Post: 36 %
FEF2575-%Pred-Pre: 29 %
FEV1-%Change-Post: 7 %
FEV1-%Pred-Post: 52 %
FEV1-%Pred-Pre: 49 %
FEV1-Post: 1.12 L
FEV1-Pre: 1.05 L
FEV1FVC-%Change-Post: 8 %
FEV1FVC-%Pred-Pre: 87 %
FEV6-%Change-Post: 0 %
FEV6-%Pred-Post: 58 %
FEV6-%Pred-Pre: 58 %
FEV6-Post: 1.56 L
FEV6-Pre: 1.56 L
FEV6FVC-%Change-Post: 0 %
FEV6FVC-%Pred-Post: 104 %
FEV6FVC-%Pred-Pre: 104 %
FVC-%Change-Post: 0 %
FVC-%Pred-Post: 55 %
FVC-%Pred-Pre: 55 %
FVC-Post: 1.56 L
FVC-Pre: 1.56 L
Post FEV1/FVC ratio: 72 %
Post FEV6/FVC ratio: 100 %
Pre FEV1/FVC ratio: 67 %
Pre FEV6/FVC Ratio: 100 %
RV % pred: 93 %
RV: 1.9 L
TLC % pred: 74 %
TLC: 3.53 L

## 2024-05-08 MED ORDER — FOLIC ACID 1 MG PO TABS: 1.0000 mg | ORAL_TABLET | Freq: Every day | ORAL | 1 refills | Status: AC

## 2024-05-08 MED ORDER — METHOTREXATE SODIUM 2.5 MG PO TABS: 10.0000 mg | ORAL_TABLET | ORAL | 5 refills | Status: AC

## 2024-05-08 NOTE — Patient Instructions (Addendum)
 It was a pleasure to see you today!  Please schedule follow up with APP in 3 months.  If my schedule is not open yet, we will contact you with a reminder closer to that time. Please call [phone] if you haven't heard from us  a month before, and always call us  sooner if issues or concerns arise. You can also send us  a message through MyChart, but but aware that this is not to be used for urgent issues and it may take up to 5-7 days to receive a reply. Please be aware that you will likely be able to view your results before I have a chance to respond to them. Please give us  5 business days to respond to any non-urgent results.   Your breathing testing is consistent with pulmonary sarcoidosis. I think it is worth treating your sarcoidosis with a medication called methotrexate.   Start with 2 pills once a week for the first week, then go up to 3 pills the next week, and finally go to 4 pills once a week and stay there (total dose 10 mg weekly.) You also need to take folic acid  with this medication 1 pill once a day. Get labs done monthly including today to monitor your liver function and blood counts while on the methotrexate.   Get your sleep study done in lab as I have ordered. If you have moderate to severe sleep apnea we may be able to get your zepbound for weight loss.   We will have you follow up in the clinic to discuss results in January.   Follow up with APP and then establish with Dr. Pawar.

## 2024-05-08 NOTE — Progress Notes (Signed)
 Full pft performed today

## 2024-05-08 NOTE — Progress Notes (Signed)
 Michelle Rios    [id number]    Mar 01, 1956  Primary Care Physician:[facility] Date of Appointment: 05/08/2024 Established Patient Visit  Chief complaint:   Chief Complaint  Patient presents with   Sarcoidosis    Worsening breathing when walking.     HPI: Michelle Rios is a 68 y.o. woman with history of pulmonary sarcoidosis diagnosed in the 58s with bronchoscopy. Not on any treatment. Referred to pulmonary for worsening dyspnea and recurrent bronchitis symptoms.   Interval Updates: Here for follow up after PFTs which show mixed obstructive and restrictive defect.   Feels weight gain is contributing to her dyspnea, however she has been overweight for a while and the breathing has still gotten worse.   She has spontaneous apneas in her sleep. She has excessive daytime sleepiness.  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 Was not able to complete the home sleep test due to technical difficulties despite multiple attempts and calling our office for assistance.  Was supposed to have knee surgery but this has been pushed to early next year as her surgeon wants her to lose weight first before operation.  I have reviewed the patient's family social and past medical history and updated as appropriate.   Past Medical History:  Diagnosis Date   AKI (acute kidney injury) 09/15/2015   Anemia    Arthritis    knees (02/20/2018)   Asthma    Bilateral swelling of feet    CAP (community acquired pneumonia) 04/11/2022   Community acquired pneumonia 12/21/2023   Constipation    Daily headache    Diarrhea 09/15/2015   Hiatal hernia    History of blood transfusion 1979   w/childbirth   Hypertension    Lactose intolerance    Osteoarthritis    Pulmonary embolism (HCC) 1990s X 1   Rheumatoid arthritis (HCC)    Sarcoidosis of lung    SOB (shortness of breath)     Past Surgical History:  Procedure Laterality Date   ANKLE FRACTURE  SURGERY Right    KNEE ARTHROSCOPY Right 1990s   TUBAL LIGATION  1990s   VAGINAL HYSTERECTOMY  1990s   WRIST FRACTURE SURGERY Left 2001    Family History  Problem Relation Age of Onset   Diabetes Mother    Kidney disease Mother    Hyperlipidemia Father    Hypertension Father    Heart attack Father    Heart disease Father    Sudden death Father    Alcoholism Father    Breast cancer Sister    Kidney cancer Sister    Diabetes Brother    Stroke Paternal Grandmother    Cancer - Lung Paternal Uncle     Social History   Occupational History   Occupation: CNA  Tobacco Use   Smoking status: Never    Passive exposure: Past   Smokeless tobacco: Never  Vaping Use   Vaping status: Never Used  Substance and Sexual Activity   Alcohol use: No   Drug use: No   Sexual activity: Not Currently     Physical Exam: Blood pressure (!) 140/88, pulse (!) 101, temperature 98.6 F (37 C), temperature source Oral, height 5' 1 (1.549 m), weight 251 lb (113.9 kg), SpO2 95%.  Gen:      No acute distress, obese ENT:  mallampati IV, no nasal polyps, mucus membranes moist Lungs:    diminished, no wheeze CV:  Regular rate and rhythm; no murmurs, rubs, or gallops.  No pedal edema   Data Reviewed: Imaging: I have personally reviewed the CT Chest from June 2025 shows RUL pneumonia. Mild posterior membrane tracheal collapse. No nodules or emphysema.   PFTs:     Latest Ref Rng & Units 05/08/2024   10:46 AM  PFT Results  FVC-Pre L 1.56  P  FVC-Predicted Pre % 55  P  FVC-Post L 1.56  P  FVC-Predicted Post % 55  P  Pre FEV1/FVC % % 67  P  Post FEV1/FCV % % 72  P  FEV1-Pre L 1.05  P  FEV1-Predicted Pre % 49  P  FEV1-Post L 1.12  P  DLCO uncorrected ml/min/mmHg 10.26  P  DLCO UNC% % 56  P  DLCO corrected ml/min/mmHg 11.38  P  DLCO COR %Predicted % 62  P  DLVA Predicted % 109  P  TLC L 3.53  P  TLC % Predicted % 74  P  RV % Predicted % 93  P    P Preliminary result   I have  personally reviewed the patient's PFTs and mixed severe obstructive and restrictive defect.   Labs: Lab Results  Component Value Date   NA 139 01/08/2024   K 4.1 01/08/2024   CO2 25 01/08/2024   GLUCOSE 99 01/08/2024   BUN 29 (H) 01/08/2024   CREATININE 1.00 01/08/2024   CALCIUM  9.3 01/08/2024   EGFR 82 08/05/2021   GFRNONAA >60 01/08/2024   Lab Results  Component Value Date   WBC 8.5 01/08/2024   HGB 10.6 (L) 01/08/2024   HCT 31.4 (L) 01/08/2024   MCV 104.0 (H) 01/08/2024   PLT 241 01/08/2024   Lab Results  Component Value Date   ALT 22 01/08/2024   AST 19 01/08/2024   ALKPHOS 73 01/08/2024   BILITOT 0.7 01/08/2024    Immunization status: Immunization History  Administered Date(s) Administered   Fluad Quad(high Dose 65+) 04/13/2022   PNEUMOCOCCAL CONJUGATE-20 04/13/2022   Tdap 02/09/2017    External Records Personally Reviewed: primary care, orthopedics.   Pulmonary Sarcoidosis, progression of symptoms Your breathing testing is consistent with pulmonary sarcoidosis. I think it is worth treating your sarcoidosis with a medication called methotrexate.   Start with 2 pills once a week for the first week, then go up to 3 pills the next week, and finally go to 4 pills once a week and stay there (total dose 10 mg weekly.) You also need to take folic acid  with this medication 1 pill once a day. Get labs done monthly including today to monitor your liver function and blood counts while on the methotrexate.   Monitor ocular symptoms; ensure regular ophthalmology follow-up. Baseline eye examination to evaluate for ocular sarcoidosis  She saw them in April 2025.  Baseline serum creatinine for renal sarcoidosis checked in June 2025 Baseline serum alkaline phosphatase for hepatic sarcoidosis - June 2025 Baseline serum calcium  - June 2025 Baseline serum complete blood count - June 2025 Baseline EKG for cardiac sarcoidosis reviewed 01/04/24 - no IV conduction delay, sinus  tachycardia   Sleep apnea Unable to tolerate home sleep study despite multiple attempts Will proceed with split night study given high index of suspicion and symptoms.   Obesity Body mass index is 47.43 kg/m. Obesity with recent weight gain, potentially exacerbating respiratory symptoms. - Discuss weight loss strategies with primary care. - Consider weight loss medication referral if sleep apnea is confirmed. - if moderate OSA  AHI>15 can get zepbound approved?  Return to Care: Return in about 3 months (around 08/08/2024).   Verdon Gore, MD Pulmonary and Critical Care Medicine [facility] Office:[phone]

## 2024-05-08 NOTE — Patient Instructions (Signed)
 Full pft performed today

## 2024-05-09 ENCOUNTER — Ambulatory Visit: Payer: Self-pay | Admitting: Internal Medicine

## 2024-05-09 ENCOUNTER — Ambulatory Visit: Admitting: Internal Medicine

## 2024-05-09 LAB — CBC
HCT: 33.9 % — ABNORMAL LOW (ref 36.0–46.0)
Hemoglobin: 11.3 g/dL — ABNORMAL LOW (ref 12.0–15.0)
MCHC: 33.2 g/dL (ref 30.0–36.0)
MCV: 98.7 fl (ref 78.0–100.0)
Platelets: 374 K/uL (ref 150.0–400.0)
RBC: 3.43 Mil/uL — ABNORMAL LOW (ref 3.87–5.11)
RDW: 12.3 % (ref 11.5–15.5)
WBC: 6.5 K/uL (ref 4.0–10.5)

## 2024-05-09 LAB — COMPREHENSIVE METABOLIC PANEL WITH GFR
ALT: 14 U/L (ref 0–35)
AST: 18 U/L (ref 0–37)
Albumin: 4.5 g/dL (ref 3.5–5.2)
Alkaline Phosphatase: 91 U/L (ref 39–117)
BUN: 15 mg/dL (ref 6–23)
CO2: 29 meq/L (ref 19–32)
Calcium: 9.5 mg/dL (ref 8.4–10.5)
Chloride: 101 meq/L (ref 96–112)
Creatinine, Ser: 1.05 mg/dL (ref 0.40–1.20)
GFR: 54.7 mL/min — ABNORMAL LOW (ref >60.00)
Glucose, Bld: 105 mg/dL — ABNORMAL HIGH (ref 70–99)
Potassium: 3.8 meq/L (ref 3.5–5.1)
Sodium: 141 meq/L (ref 135–145)
Total Bilirubin: 0.4 mg/dL (ref 0.2–1.2)
Total Protein: 7.3 g/dL (ref 6.0–8.3)

## 2024-05-28 ENCOUNTER — Encounter: Payer: Self-pay | Admitting: Radiology

## 2024-07-09 ENCOUNTER — Ambulatory Visit (HOSPITAL_BASED_OUTPATIENT_CLINIC_OR_DEPARTMENT_OTHER): Attending: Internal Medicine | Admitting: Pulmonary Disease

## 2024-07-09 VITALS — Ht 61.0 in | Wt 262.0 lb

## 2024-07-09 DIAGNOSIS — G4730 Sleep apnea, unspecified: Secondary | ICD-10-CM

## 2024-07-09 DIAGNOSIS — G4733 Obstructive sleep apnea (adult) (pediatric): Secondary | ICD-10-CM | POA: Diagnosis not present

## 2024-07-11 ENCOUNTER — Ambulatory Visit: Payer: Self-pay | Admitting: Internal Medicine

## 2024-07-11 NOTE — Telephone Encounter (Signed)
 FYI Only or Action Required?: FYI only for provider: ED advised.  Patient is followed in Pulmonology for Sarcoidosis, last seen on 05/08/2024 by Michelle Verdon RAMAN, MD.  Called Nurse Triage reporting Wheezing and Facial Swelling.  Symptoms began several days ago.  Interventions attempted: Other: Rest.  Symptoms are: gradually improving. Asthma attack on Monday and Tuesday, none today. Symptoms have improved but still having increased Sob with wheezing with exertion.  Triage Disposition: Go to ED Now (Notify PCP)  Patient/caregiver understands and will follow disposition?: Yes   Thursday of last week increased SOB and wheezing with exertion and moderate intermittent cough. Had asthma attack on Monday and Tuesday, does not take any rescue inhalers. Takes methotrexate  for sarcoidosis, stopped taking it Thursday, states she didn't feel like taking it. Reports no asthma attack today and breathing feels better. No symptoms at rest, severe Sob with wheezing persist with exertion. Also reporting new onset puffiness of right eye with some drainage after sleep study on Monday. Pt concerned they may have put albuterol  into her mask, reports similar adverse reaction to albuterol  in office in August of this year after trying albuterol . Denies swelling of lips tongue or throat. Wheezing and SOB was ongoing before her sleep study. Advised ED, pt agreeable to go. Advised 911 for worsening symptoms.   Michelle Rios Pulmonary Triage - Initial Assessment Questions Chief Complaint (e.g., cough, sob, wheezing, fever, chills, sweat or additional symptoms) *Go to specific symptom protocol after initial questions. Increased SOB with wheezing and cough  How long have symptoms been present? Since Thursday  Have you tested for COVID or Flu? Note: If not, ask patient if a home test can be taken. If so, instruct patient to call back for positive results. No  MEDICINES:   Have you used any OTC meds to help with symptoms?  No If yes, ask What medications? Denies  Have you used your inhalers/maintenance medication? No If yes, What medications? Does not have one  If inhaler, ask How many puffs and how often? Note: Review instructions on medication in the chart. Denies  OXYGEN: Do you wear supplemental oxygen? No If yes, How many liters are you supposed to use? Denies  Do you monitor your oxygen levels? No If yes, What is your reading (oxygen level) today? Denies  What is your usual oxygen saturation reading?  (Note: Pulmonary O2 sats should be 90% or greater) Unsure    Copied from CRM #[id number]. Topic: Clinical - Red Word Triage >> Jul 11, 2024 12:55 PM [name] wrote: Red Word that prompted transfer to Nurse Triage: Patient states she's allergic to Albuterol  - her eyes are swollen, experienced SOB yesterday. Reason for Disposition  [1] MODERATE asthma attack (e.g., SOB at rest, speaks in phrases, audible wheezes) AND [2] doesn't have neb or inhaler available    Asthma attack yesterday, none today, breathing feels better but still having severe wheezing with Sob with exertion.  Answer Assessment - Initial Assessment Questions 1. RESPIRATORY STATUS: Describe your breathing? (e.g., wheezing, shortness of breath, unable to speak, severe coughing)      None at rest, only with exertion. Severe SOB and wheezing with exertion  2. ONSET: When did this asthma attack begin?      Monday and yesterday. No asthma attack today. Severe SOB and wheezing persists with exertion  3. TRIGGER: What do you think triggered this attack? (e.g., URI, exposure to pollen or other allergen, tobacco smoke)      Unsure  4. PEAK EXPIRATORY FLOW RATE (  PEFR): Do you use a peak flow meter? If Yes, ask: What's the current peak flow? What's your personal best peak flow?      Has not used recently  5. SEVERITY: How bad is this attack?      Severe. Had to sit down on a bench for 10 minutes. Then had  to use a wheelchair to get inside instead of a cane.  6. ASTHMA MEDICINES:  What treatments have you tried?      Denies  7. INHALED QUICK-RELIEF TREATMENTS FOR THIS ATTACK: What treatments have you given yourself so far? and How many and how often? If using an inhaler, ask, How many puffs? Note: Routine treatments are 2 puffs every 4 hours as needed. Rescue treatments are 4 puffs repeated every 20 minutes, up to three times as needed.      Denies. Does not take anything  8. OTHER SYMPTOMS: Do you have any other symptoms? (e.g., chest pain, coughing up yellow sputum, fever, runny nose)     Cough x1 week. Dry cough.  9. O2 SATURATION MONITOR:  Do you use an oxygen saturation monitor (pulse oximeter) at home? If Yes, What is your reading (oxygen level) today? What is your usual oxygen saturation reading? (e.g., 95%)     Denies  Protocols used: Asthma Attack-A-AH

## 2024-07-24 ENCOUNTER — Telehealth: Payer: Self-pay | Admitting: Pulmonary Disease

## 2024-07-24 DIAGNOSIS — G4733 Obstructive sleep apnea (adult) (pediatric): Secondary | ICD-10-CM | POA: Diagnosis not present

## 2024-07-24 NOTE — Procedures (Signed)
 [facility] [address] [place], [place] [address] Tel: [phone]   Fax: [phone]  Split Night Interpretation  Patient Name:  Michelle Rios, Michelle Rios Date:  07/09/2024 Referring Physician:  [name], MD %%startinterp%% Indications for Polysomnography The patient is a 68 year old Female who is 5' 1 and weighs 262.0 lbs.  Her BMI equals 50.1.  A diagnostic polysomnogram was performed to evaluate for OSA.  After 127.5 minutes of sleep time the patient exhibited sufficient respiratory events qualifying her for a CPAP trial which was then initiated.    Medications Taken:  NO MEDICATIONS TAKEN.   Polysomnogram Data A full night polysomnogram was performed recording the standard physiologic parameters including EEG, EOG, EMG, EKG, nasal and oral airflow.  Respiratory parameters of chest and abdominal movements are recorded with Piezo-Crystal motion transducers.  Oxygen saturation was recorded by pulse oximetry.    Sleep Architecture The total recording time of the diagnostic portion of the study was 164.9 minutes.  The total sleep time was 127.5 minutes.  During the diagnostic portion of the study, the patient spent 4.3% of total sleep time in Stage N1, 93.3% in Stage N2, 0.0% in Stages N3, and 2.4% in REM.   Sleep latency was 10.4 minutes.  REM latency was 65.5 minutes.  Sleep Efficiency was 77.3%.  Wake after Sleep Onset time was 27.0 minutes.   At 12:26:27 AM the patient was placed on PAP treatment and was titrated at pressures ranging from 6* cm/H20 with supplemental oxygen at - up to 14* cm/H20 with supplemental oxygen at -.  The total recording time of the treatment portion of the study was 274.8 minutes.  The total sleep time was 168.5 minutes.  During the treatment portion of the study, the patient spent 2.7% of total sleep time in Stage N1, 69.4% in Stage N2, 5.3% in Stages N3, and 22.6% in REM.   Sleep latency was 4.0 minutes.  REM latency  was 7.5 minutes.  Sleep Efficiency was 61.3%.  Wake after Sleep Onset time was 102.0 minutes.  Respiratory Events During the diagnostic portion of the study, the polysomnogram revealed a presence of 6 obstructive, 4 central, and 1 mixed apnea resulting in an Apnea index of 5.2 events per hour.  There were 68 hypopneas (>=3% desaturation and/or arousal) resulting in an Apnea\Hypopnea Index (AHI >=3% desaturation and/or arousal) of 37.2 events per hour.  There were 36 hypopneas (>=4% desaturation) resulting in an Apnea\Hypopnea Index (AHI >=4% desaturation) of 22.1 events per hour.  There were 31 Respiratory Effort Related Arousals resulting in a RERA index of 14.6 events per hour. The Respiratory Disturbance Index is 51.8 events per hour.  The snore index was 179.8 events per hour.  Mean oxygen saturation was 94.5%.  The lowest oxygen saturation during sleep was 73.0%.  Time spent <=88% oxygen saturation was 7.3 minutes (4.6%).  During the treatment portion of the study, the polysomnogram revealed a presence of 1 obstructive, 15 centrals, and - mixed apneas resulting in an Apnea index of 5.7 events per hour.  There were 44 hypopneas (>=3% desaturation and/or arousal) resulting in an Apnea\Hypopnea Index (AHI >=3% desaturation and/or arousal) of 21.4 events per hour.  There were 27 hypopneas (>=4% desaturation) resulting in an Apnea\Hypopnea Index (AHI >=4% desaturation) of 15.3 events per hour.  There were 17 Respiratory Effort Related Arousals resulting in a RERA index of 6.1 events per hour. The Respiratory Disturbance Index is 27.4 events per hour.  The snore index  was 23.1 events per hour.  Mean oxygen saturation was 96.2%.  The lowest oxygen saturation during sleep was 83.0%.  Time spent <=88% oxygen saturation was 2.9 minutes (1.1%).  Limb Activity During the diagnostic portion of the study, there were - limb movements recorded.    During the treatment portion of the study, there were - limb movements  recorded.    Cardiac Summary During the diagnostic portion of the study, the average pulse rate was 66.6 bpm.  The minimum pulse rate was 53.0 bpm while the maximum pulse rate was 87.0 bpm.  During the treatment portion of the study, the average pulse rate was 59.5 bpm.  The minimum pulse rate was 48.0 bpm while the maximum pulse rate was 85.0 bpm.   Comments : The patient met split night criteria and was started on CPAP at a starting pressure of 6 cm H2O which was increased to a pressure of 14 cm H2O. The patient tolerated the pressures well and mask leaks remained minimal during sleep. A small ResMed AirFit F20 full-face mask was used during the titration  Diagnosis: Moderate OSA corrected by CPAP 14 cm  Recommendations: Initial CPAP 14 cm with small full face mask Alternatively, autoCPAP 10-14 cm can be used  Monitor compliance & CPAP downloads at this level   This study was personally reviewed and electronically signed by: Cassiel Fernandez V, MD Accredited Board Certified in Sleep Medicine  07/24/24

## 2024-07-24 NOTE — Telephone Encounter (Signed)
#   listed in chart has been disconnected so My chart message sent

## 2024-07-24 NOTE — Telephone Encounter (Signed)
 ND pt  -NPSG showed mod  OSA with AHI 22/ hr & low sat of 73% Suggest autoCPAP  10-14 cm, small FF mask OV with any sleep doc in 6 weeks please

## 2024-07-30 ENCOUNTER — Telehealth: Payer: Self-pay | Admitting: Primary Care

## 2024-07-30 DIAGNOSIS — G4733 Obstructive sleep apnea (adult) (pediatric): Secondary | ICD-10-CM

## 2024-07-30 NOTE — Telephone Encounter (Addendum)
ATC x1.  LMTCB. 

## 2024-07-30 NOTE — Telephone Encounter (Signed)
 Please let patient know sleep study showed  moderate OSA corrected by CPAP 14 cm   Please place order for CPAP with set pressure 14 cm with small full face mask  Needs visit with sleep provider in 6 weeks for compliance check in

## 2024-07-31 NOTE — Telephone Encounter (Signed)
 ATC x2.  No answer, reached a fast busy signal.  Mychart message sent.  Order sent for CPAP

## 2024-08-06 ENCOUNTER — Other Ambulatory Visit: Payer: Self-pay

## 2024-08-06 ENCOUNTER — Ambulatory Visit

## 2024-08-06 ENCOUNTER — Ambulatory Visit (INDEPENDENT_AMBULATORY_CARE_PROVIDER_SITE_OTHER)

## 2024-08-06 ENCOUNTER — Ambulatory Visit: Payer: Self-pay

## 2024-08-06 VITALS — BP 139/84 | HR 88 | Temp 100.4°F | Ht 61.0 in | Wt 249.0 lb

## 2024-08-06 DIAGNOSIS — G4733 Obstructive sleep apnea (adult) (pediatric): Secondary | ICD-10-CM

## 2024-08-06 DIAGNOSIS — J45901 Unspecified asthma with (acute) exacerbation: Secondary | ICD-10-CM

## 2024-08-06 DIAGNOSIS — R059 Cough, unspecified: Secondary | ICD-10-CM

## 2024-08-06 DIAGNOSIS — J069 Acute upper respiratory infection, unspecified: Secondary | ICD-10-CM

## 2024-08-06 DIAGNOSIS — R509 Fever, unspecified: Secondary | ICD-10-CM

## 2024-08-06 DIAGNOSIS — D860 Sarcoidosis of lung: Secondary | ICD-10-CM

## 2024-08-06 LAB — POC COVID19 BINAXNOW: SARS Coronavirus 2 Ag: NEGATIVE

## 2024-08-06 MED ORDER — BENZONATATE 100 MG PO CAPS: 100.0000 mg | ORAL_CAPSULE | Freq: Four times a day (QID) | ORAL | 1 refills | Status: AC | PRN

## 2024-08-06 MED ORDER — BUDESONIDE 0.5 MG/2ML IN SUSP: 0.5000 mg | Freq: Two times a day (BID) | RESPIRATORY_TRACT | 11 refills | Status: AC

## 2024-08-06 NOTE — Progress Notes (Signed)
 "   Subjective:   PATIENT ID: Michelle Rios GENDER: female DOB: 01/26/1956, MRN: [id number]   HPI Discussed the use of AI scribe software for clinical note transcription with the patient, who gave verbal consent to proceed.  History of Present Illness Michelle Rios is a 69 year old female with sarcoidosis on methotrexate  and moderate sleep apnea who presents with flu-like symptoms.  She has been experiencing flu-like symptoms for about a month, with a notable worsening this past Saturday. Her symptoms include coughing, headaches, wheezing, and shortness of breath, particularly exacerbated by physical activity. The wheezing is severe enough to disrupt her sleep, waking her at night and preventing her from returning to sleep.  She reports chest pain associated with coughing but denies any mucus or phlegm production. She has experienced fever and chills, though she did not measure her temperature. Additionally, she reports night sweats and feeling cold.  She has a history of adverse reactions to albuterol , including eye drainage and itching, and therefore avoids using inhalers. She has not been using any inhalers at home due to these reactions.  She has been in contact with a church member who has similar symptoms, although she does not reside together. She recalls a previous episode in July where she experienced difficulty breathing and was transferred from urgent care to the hospital.  She is currently on methotrexate  for sarcoidosis and has a history of morbid obesity. She has not yet received her CPAP machine.     Past Medical History:  Diagnosis Date   AKI (acute kidney injury) 09/15/2015   Anemia    Arthritis    knees (02/20/2018)   Asthma    Bilateral swelling of feet    CAP (community acquired pneumonia) 04/11/2022   Community acquired pneumonia 12/21/2023   Constipation    Daily headache    Diarrhea 09/15/2015   Hiatal hernia    History of blood transfusion 1979    w/childbirth   Hypertension    Lactose intolerance    Osteoarthritis    Pulmonary embolism (HCC) 1990s X 1   Rheumatoid arthritis (HCC)    Sarcoidosis of lung    SOB (shortness of breath)      Family History  Problem Relation Age of Onset   Diabetes Mother    Kidney disease Mother    Hyperlipidemia Father    Hypertension Father    Heart attack Father    Heart disease Father    Sudden death Father    Alcoholism Father    Breast cancer Sister    Kidney cancer Sister    Diabetes Brother    Stroke Paternal Grandmother    Cancer - Lung Paternal Uncle      Social History   Socioeconomic History   Marital status: Widowed    Spouse name: Not on file   Number of children: 3   Years of education: Not on file   Highest education level: Not on file  Occupational History   Occupation: CNA  Tobacco Use   Smoking status: Never    Passive exposure: Past   Smokeless tobacco: Never  Vaping Use   Vaping status: Never Used  Substance and Sexual Activity   Alcohol use: No   Drug use: No   Sexual activity: Not Currently  Other Topics Concern   Not on file  Social History Narrative   Not on file   Social Drivers of Health   Tobacco Use: Low Risk (08/06/2024)   Patient History  Smoking Tobacco Use: Never    Smokeless Tobacco Use: Never    Passive Exposure: Past  Financial Resource Strain: Low Risk  (04/19/2024)   Received from [facility]   Overall Financial Resource Strain (CARDIA)    Difficulty of Paying Living Expenses: Not hard at all  Food Insecurity: No Food Insecurity (04/19/2024)   Received from [facility]   Epic    Within the past 12 months, you worried that your food would run out before you got the money to buy more.: Never true    Within the past 12 months, the food you bought just didn't last and you didn't have money to get more.: Never true  Transportation Needs: No Transportation Needs (04/19/2024)   Received from [facility] - Transportation    In the past 12 months, has lack of transportation kept you from medical appointments or from getting medications?: No    Lack of Transportation (Non-Medical): No  Physical Activity: Not on file  Stress: Not on file  Social Connections: Moderately Integrated (01/03/2024)   Social Connection and Isolation Panel    Frequency of Communication with Friends and Family: More than three times a week    Frequency of Social Gatherings with Friends and Family: More than three times a week    Attends Religious Services: More than 4 times per year    Active Member of [employer] or Organizations: Yes    Attends Banker Meetings: More than 4 times per year    Marital Status: Widowed  Recent Concern: Social Connections - Moderately Isolated (12/21/2023)   Social Connection and Isolation Panel    Frequency of Communication with Friends and Family: More than three times a week    Frequency of Social Gatherings with Friends and Family: Three times a week    Attends Religious Services: More than 4 times per year    Active Member of Clubs or Organizations: No    Attends Banker Meetings: Never    Marital Status: Widowed  Intimate Partner Violence: Not At Risk (01/03/2024)   Humiliation, Afraid, Rape, and Kick questionnaire    Fear of Current or Ex-Partner: No    Emotionally Abused: No    Physically Abused: No    Sexually Abused: No  Depression (PHQ2-9): High Risk (08/04/2021)   Depression (PHQ2-9)    PHQ-2 Score: 14  Alcohol Screen: Not on file  Housing: Unknown (04/19/2024)   Received from [facility]   Epic    In the last 12 months, was there a time when you were not able to pay the mortgage or rent on time?: No    Number of Times Moved in the Last Year: Not on file    At any time in the past 12 months, were you homeless or living in a shelter (including now)?: No  Utilities: Not At Risk (04/19/2024)    Received from [facility]   Epic    In the past 12 months has the electric, gas, oil, or water company threatened to shut off services in your home?: No  Health Literacy: Not on file     Allergies[1]   Outpatient Medications Prior to Visit  Medication Sig Dispense Refill   folic acid  (FOLVITE ) 1 MG tablet Take 1 tablet (1 mg total) by mouth daily. 90 tablet 1   methotrexate  (RHEUMATREX) 2.5 MG tablet Take 4 tablets (10 mg total) by mouth once a  week. Caution:Chemotherapy. Protect from light. 16 tablet 5   No facility-administered medications prior to visit.    ROS Reviewed all systems and reported negative except as above     Objective:   Vitals:   08/06/24 1318  BP: (!) 161/93  Pulse: 88  Temp: (!) 100.4 F (38 C)  TempSrc: Oral  SpO2: 97%  Weight: 249 lb (112.9 kg)  Height: 5' 1 (1.549 m)    Physical Exam Physical Exam GENERAL: Appropriate to age, no acute distress. HEAD EYES EARS NOSE THROAT: Moist mucous membranes, atraumatic, normocephalic. CHEST: Wheezing present, no pneumonia on x-ray. CARDIAC: Regular rate and rhythm, normal S1, normal S2, no murmurs, no rubs, no gallops. ABDOMEN: Soft, nontender. NEUROLOGICAL: Motor and sensation grossly intact, alert and oriented times X 3. EXTREMITIES: Warm, well perfused, no edema.     CBC    Component Value Date/Time   WBC 6.5 05/08/2024 1529   RBC 3.43 (L) 05/08/2024 1529   HGB 11.3 (L) 05/08/2024 1529   HGB 11.5 08/05/2021 1438   HCT 33.9 (L) 05/08/2024 1529   HCT 34.1 08/05/2021 1438   PLT 374.0 05/08/2024 1529   PLT 375 08/05/2021 1438   MCV 98.7 05/08/2024 1529   MCV 96 08/05/2021 1438   MCH 35.1 (H) 01/08/2024 0553   MCHC 33.2 05/08/2024 1529   RDW 12.3 05/08/2024 1529   RDW 11.5 (L) 08/05/2021 1438   LYMPHSABS 2.7 01/08/2024 0553   LYMPHSABS 2.3 08/05/2021 1438   MONOABS 0.6 01/08/2024 0553   EOSABS 0.0 01/08/2024 0553   EOSABS 0.1 08/05/2021 1438   BASOSABS 0.0 01/08/2024  0553   BASOSABS 0.0 08/05/2021 1438     Results Labs COVID-19 PCR (08/06/2024): Negative  Radiology Chest X-ray (08/06/2024): No pulmonary infiltrate; no evidence of pneumonia. Hilar fullness present. (Independently interpreted)   PFT:    Latest Ref Rng & Units 05/08/2024   10:46 AM  PFT Results  FVC-Pre L 1.56   FVC-Predicted Pre % 55   FVC-Post L 1.56   FVC-Predicted Post % 55   Pre FEV1/FVC % % 67   Post FEV1/FCV % % 72   FEV1-Pre L 1.05   FEV1-Predicted Pre % 49   FEV1-Post L 1.12   DLCO uncorrected ml/min/mmHg 10.26   DLCO UNC% % 56   DLCO corrected ml/min/mmHg 11.38   DLCO COR %Predicted % 62   DLVA Predicted % 109   TLC L 3.53   TLC % Predicted % 74   RV % Predicted % 93          Assessment & Plan:   Assessment and Plan Assessment & Plan Acute upper respiratory infection in a patient with pulmonary sarcoidosis Flu-like symptoms with negative COVID test. Differential includes common cold and influenza. Sarcoidosis increases risk of exacerbations. Inhalers not tolerated. Prednisone  not preferred due to side effects. - Perform home influenza test using over-the-counter kit. - If influenza test is positive, call office for Tamiflu prescription. - If influenza test is negative, rest and hydrate at home. - Prescribed Pulmicort  nebulizer solution for long-term management of sarcoidosis. Patient is noted to be wheezing on exam. Avoiding systemic steroids until Influenza A/B is ruled out.  - Prescribed Tessalon  Perles for cough management.  Pulmonary sarcoidosis on methotrexate  - Continue methotrexate  for now.  Will consider holding if she has ongoing symptoms - Possible reactive airway disease component with history of sarcoidosis.  Wheezing on exam today.  Unclear baseline.   Obstructive sleep apnea Diagnosed with moderate obstructive sleep apnea.  Has not received CPAP machine. - Requested CPAP machine for home use. - Scheduled follow-up with sarcoid  specialist, Dr. Kassie .    I spent 43 minutes caring for this patient today, including preparing to see the patient, obtaining a medical history , reviewing a separately obtained history, performing a medically appropriate examination and/or evaluation, counseling and educating the patient/family/caregiver, ordering medications, tests, or procedures, referring and communicating with other health care professionals (not separately reported), documenting clinical information in the electronic health record, independently interpreting results (not separately reported/billed) and communicating results to the patient/family/caregiver, and care coordination (not separately reported/billed)  MDM  I reviewed prior external note(s) from Dr. Kassie and Dr. Harden  I reviewed the result(s) of sleep study  I have ordered COVID swab, nebulizer machine with Pulmicort  nebs, CPAP machine   I answered all the patient's questions and concerns.   Zola Herter, MD [facility] Office: [phone]       [1]  Allergies Allergen Reactions   Ivp Dye [Iodinated Contrast Media] Anaphylaxis and Swelling   Voltaren  [Diclofenac  Sodium] Nausea And Vomiting and Other (See Comments)    Bad taste in mouth Mouth and tongue turned black Vomiting thick, white mucous    "

## 2024-08-06 NOTE — Patient Instructions (Addendum)
" °  VISIT SUMMARY: During your visit, we discussed your flu-like symptoms, which have been ongoing for about a month and worsened recently. You have been experiencing coughing, headaches, wheezing, shortness of breath, chest pain, fever, chills, night sweats, and feeling cold. We also reviewed your history of sarcoidosis and sleep apnea.  YOUR PLAN: -ACUTE UPPER RESPIRATORY INFECTION IN A PATIENT WITH PULMONARY SARCOIDOSIS: You have an acute upper respiratory infection, which means you have a sudden infection affecting your upper respiratory tract. This is likely causing your flu-like symptoms. Since you have sarcoidosis, you are at a higher risk for respiratory issues. We recommend performing a home influenza test using an over-the-counter kit. If the test is positive, please call our office for a Tamiflu prescription. If the test is negative, rest and hydrate at home. We have prescribed Pulmicort  nebulizer solution for long-term management of your sarcoidosis and Tessalon  Perles to help manage your cough.  -OBSTRUCTIVE SLEEP APNEA: Obstructive sleep apnea is a condition where your breathing stops and starts during sleep due to blocked airways. You have been diagnosed with moderate obstructive sleep apnea and have not yet received your CPAP machine. We have requested a CPAP machine for you to use at home and scheduled a follow-up with your sarcoid specialist, Dr. Isaiah.  INSTRUCTIONS: Please perform the home influenza test as soon as possible. If the test is positive, call our office for a Tamiflu prescription. If the test is negative, focus on resting and staying hydrated. Use the Pulmicort  nebulizer solution as prescribed for your sarcoidosis and take Tessalon  Perles to manage your cough. We have requested a CPAP machine for your sleep apnea, and you should follow up with Dr. Kassie, your sarcoid specialist.        Contains text generated by [employer].   "

## 2024-08-06 NOTE — Telephone Encounter (Signed)
" °  FYI Only or Action Required?: Action required by provider: request for appointment.  Patient was last seen in primary care on 08/18/2021 by [place] Adelita PENNER, MD.  Called Nurse Triage reporting Cough.  Symptoms began several days ago.  Interventions attempted: Rest, hydration, or home remedies.  Symptoms are: gradually worsening. Non-productive cough, SOB. Cough started Saturday. SOB started 1 month ago. Warm transfer to Debbie in the practice for appointment.  Triage Disposition: See HCP Within 4 Hours (Or PCP Triage)  Patient/caregiver understands and will follow disposition?: Yes      Copied from CRM #[id number]. Topic: Clinical - Red Word Triage >> Aug 06, 2024 10:17 AM Rilla B wrote: [facility] that prompted transfer to Nurse Triage: Chest congestion, shortness of breath,    ----------------------------------------------------------------------- From previous Reason for Contact - Lab/Test Order Request: Reason for CRM: Patient states she is suppose to have lab work Reason for Disposition  [1] MILD difficulty breathing (e.g., minimal/no SOB at rest, SOB with walking, pulse < 100) AND [2] still present when not coughing  Answer Assessment - Initial Assessment Questions 1. ONSET: When did the cough begin?      Saturday 2. SEVERITY: How bad is the cough today?      moderate 3. SPUTUM: Describe the color of your sputum (e.g., none, dry cough; clear, white, yellow, green)     none 4. HEMOPTYSIS: Are you coughing up any blood? If Yes, ask: How much? (e.g., flecks, streaks, tablespoons, etc.)     no 5. DIFFICULTY BREATHING: Are you having difficulty breathing? If Yes, ask: How bad is it? (e.g., mild, moderate, severe)      With exertion 6. FEVER: Do you have a fever? If Yes, ask: What is your temperature, how was it measured, and when did it start?     no 7. CARDIAC HISTORY: Do you have any history of heart disease? (e.g., heart attack, congestive heart  failure)      no 8. LUNG HISTORY: Do you have any history of lung disease?  (e.g., pulmonary embolus, asthma, emphysema)     sarcoid 9. PE RISK FACTORS: Do you have a history of blood clots? (or: recent major surgery, recent prolonged travel, bedridden)     no 10. OTHER SYMPTOMS: Do you have any other symptoms? (e.g., runny nose, wheezing, chest pain)       wheezing 11. PREGNANCY: Is there any chance you are pregnant? When was your last menstrual period?       no 12. TRAVEL: Have you traveled out of the country in the last month? (e.g., travel history, exposures)       no  Protocols used: Cough - Acute Non-Productive-A-AH  "

## 2024-08-06 NOTE — Telephone Encounter (Signed)
 Patient was seen Dr. Zaida 08/06/24 at 1:30 pm.  Nothing further needed.

## 2024-10-25 ENCOUNTER — Ambulatory Visit (HOSPITAL_BASED_OUTPATIENT_CLINIC_OR_DEPARTMENT_OTHER): Admitting: Pulmonary Disease
